# Patient Record
Sex: Male | Born: 1950 | Race: White | Hispanic: No | Marital: Married | Smoking: Former smoker
Health system: Southern US, Community
[De-identification: ages and names within clinical notes are randomized; demographics above are authoritative.]

## PROBLEM LIST (undated history)

## (undated) DIAGNOSIS — E785 Hyperlipidemia, unspecified: Secondary | ICD-10-CM

## (undated) DIAGNOSIS — I219 Acute myocardial infarction, unspecified: Secondary | ICD-10-CM

## (undated) DIAGNOSIS — M1990 Unspecified osteoarthritis, unspecified site: Secondary | ICD-10-CM

## (undated) DIAGNOSIS — I639 Cerebral infarction, unspecified: Secondary | ICD-10-CM

## (undated) DIAGNOSIS — I10 Essential (primary) hypertension: Secondary | ICD-10-CM

## (undated) DIAGNOSIS — I2510 Atherosclerotic heart disease of native coronary artery without angina pectoris: Secondary | ICD-10-CM

## (undated) HISTORY — DX: Hyperlipidemia, unspecified: E78.5

## (undated) HISTORY — DX: Acute myocardial infarction, unspecified: I21.9

## (undated) HISTORY — PX: CARDIAC CATHETERIZATION: SHX172

## (undated) HISTORY — PX: CORONARY ANGIOPLASTY: SHX604

## (undated) HISTORY — DX: Atherosclerotic heart disease of native coronary artery without angina pectoris: I25.10

## (undated) HISTORY — PX: APPENDECTOMY: SHX54

## (undated) HISTORY — DX: Essential (primary) hypertension: I10

---

## 2003-09-27 DIAGNOSIS — I219 Acute myocardial infarction, unspecified: Secondary | ICD-10-CM

## 2003-09-27 HISTORY — DX: Acute myocardial infarction, unspecified: I21.9

## 2004-01-05 ENCOUNTER — Inpatient Hospital Stay (HOSPITAL_COMMUNITY): Admission: EM | Admit: 2004-01-05 | Discharge: 2004-01-07 | Payer: Self-pay | Admitting: Emergency Medicine

## 2004-12-31 ENCOUNTER — Ambulatory Visit: Payer: Self-pay | Admitting: Cardiology

## 2005-02-02 ENCOUNTER — Ambulatory Visit: Payer: Self-pay | Admitting: Cardiology

## 2005-02-11 ENCOUNTER — Ambulatory Visit: Payer: Self-pay | Admitting: Cardiology

## 2005-02-18 ENCOUNTER — Inpatient Hospital Stay (HOSPITAL_BASED_OUTPATIENT_CLINIC_OR_DEPARTMENT_OTHER): Admission: RE | Admit: 2005-02-18 | Discharge: 2005-02-18 | Payer: Self-pay | Admitting: Cardiology

## 2005-02-24 ENCOUNTER — Ambulatory Visit: Payer: Self-pay | Admitting: Cardiology

## 2005-03-04 ENCOUNTER — Ambulatory Visit: Payer: Self-pay | Admitting: Cardiology

## 2008-02-07 ENCOUNTER — Ambulatory Visit: Payer: Self-pay | Admitting: Gastroenterology

## 2012-10-08 ENCOUNTER — Ambulatory Visit: Payer: Self-pay | Admitting: Family Medicine

## 2012-10-11 ENCOUNTER — Ambulatory Visit: Payer: Self-pay | Admitting: Family Medicine

## 2014-02-07 DIAGNOSIS — E782 Mixed hyperlipidemia: Secondary | ICD-10-CM | POA: Insufficient documentation

## 2014-02-07 DIAGNOSIS — I2510 Atherosclerotic heart disease of native coronary artery without angina pectoris: Secondary | ICD-10-CM | POA: Insufficient documentation

## 2014-08-27 DIAGNOSIS — I119 Hypertensive heart disease without heart failure: Secondary | ICD-10-CM | POA: Insufficient documentation

## 2015-02-12 DIAGNOSIS — I10 Essential (primary) hypertension: Secondary | ICD-10-CM | POA: Insufficient documentation

## 2015-08-31 ENCOUNTER — Ambulatory Visit (INDEPENDENT_AMBULATORY_CARE_PROVIDER_SITE_OTHER): Payer: BC Managed Care – PPO | Admitting: Family Medicine

## 2015-08-31 ENCOUNTER — Encounter: Payer: Self-pay | Admitting: Family Medicine

## 2015-08-31 VITALS — BP 127/74 | HR 65 | Temp 98.7°F | Ht 70.2 in | Wt 251.0 lb

## 2015-08-31 DIAGNOSIS — Z23 Encounter for immunization: Secondary | ICD-10-CM | POA: Diagnosis not present

## 2015-08-31 DIAGNOSIS — Z0000 Encounter for general adult medical examination without abnormal findings: Secondary | ICD-10-CM

## 2015-08-31 DIAGNOSIS — Z113 Encounter for screening for infections with a predominantly sexual mode of transmission: Secondary | ICD-10-CM | POA: Diagnosis not present

## 2015-08-31 DIAGNOSIS — I10 Essential (primary) hypertension: Secondary | ICD-10-CM

## 2015-08-31 DIAGNOSIS — E785 Hyperlipidemia, unspecified: Secondary | ICD-10-CM | POA: Diagnosis not present

## 2015-08-31 DIAGNOSIS — I2510 Atherosclerotic heart disease of native coronary artery without angina pectoris: Secondary | ICD-10-CM

## 2015-08-31 LAB — URINALYSIS, ROUTINE W REFLEX MICROSCOPIC
Bilirubin, UA: NEGATIVE
GLUCOSE, UA: NEGATIVE
Ketones, UA: NEGATIVE
Leukocytes, UA: NEGATIVE
NITRITE UA: NEGATIVE
PH UA: 5 (ref 5.0–7.5)
Protein, UA: NEGATIVE
Specific Gravity, UA: 1.02 (ref 1.005–1.030)
UUROB: 0.2 mg/dL (ref 0.2–1.0)

## 2015-08-31 LAB — MICROSCOPIC EXAMINATION
EPITHELIAL CELLS (NON RENAL): NONE SEEN /HPF (ref 0–10)
WBC UA: NONE SEEN /HPF (ref 0–?)

## 2015-08-31 MED ORDER — MELOXICAM 7.5 MG PO TABS: 7.5000 mg | ORAL_TABLET | Freq: Every day | ORAL | Status: DC

## 2015-08-31 MED ORDER — EZETIMIBE 10 MG PO TABS: 10.0000 mg | ORAL_TABLET | Freq: Every day | ORAL | Status: DC

## 2015-08-31 MED ORDER — CARVEDILOL 25 MG PO TABS: 25.0000 mg | ORAL_TABLET | Freq: Two times a day (BID) | ORAL | Status: DC

## 2015-08-31 MED ORDER — ATORVASTATIN CALCIUM 40 MG PO TABS: 40.0000 mg | ORAL_TABLET | Freq: Every day | ORAL | Status: DC

## 2015-08-31 MED ORDER — LISINOPRIL 40 MG PO TABS: 40.0000 mg | ORAL_TABLET | Freq: Every day | ORAL | Status: DC

## 2015-08-31 MED ORDER — AMLODIPINE BESYLATE 10 MG PO TABS: 10.0000 mg | ORAL_TABLET | Freq: Every day | ORAL | Status: DC

## 2015-08-31 NOTE — Assessment & Plan Note (Signed)
The current medical regimen is effective;  continue present plan and medications.  

## 2015-08-31 NOTE — Progress Notes (Signed)
BP 127/74 mmHg  Pulse 65  Temp(Src) 98.7 F (37.1 C)  Ht 5' 10.2" (1.783 m)  Wt 251 lb (113.853 kg)  BMI 35.81 kg/m2  SpO2 99%   Subjective:    Patient ID: Andre Gallegos, male    DOB: 27-Apr-1951, 64 y.o.   MRN: [id number]  HPI: Andre Gallegos is a 64 y.o. male  Chief Complaint  Patient presents with  . Annual Exam   doing well with blood pressure medications no complaints or issues Cholesterol medication takes well without problems No chest pain or chest symptoms Arthritis doing well takes meloxicam every day.  Relevant past medical, surgical, family and social history reviewed and updated as indicated. Interim medical history since our last visit reviewed. Allergies and medications reviewed and updated.  Review of Systems  Constitutional: Negative.   HENT: Negative.   Eyes: Negative.   Respiratory: Negative.   Cardiovascular: Negative.   Gastrointestinal: Negative.   Endocrine: Negative.   Genitourinary: Negative.   Musculoskeletal: Negative.   Skin: Negative.   Allergic/Immunologic: Negative.   Neurological: Negative.   Hematological: Negative.   Psychiatric/Behavioral: Negative.     Per HPI unless specifically indicated above     Objective:    BP 127/74 mmHg  Pulse 65  Temp(Src) 98.7 F (37.1 C)  Ht 5' 10.2" (1.783 m)  Wt 251 lb (113.853 kg)  BMI 35.81 kg/m2  SpO2 99%  Wt Readings from Last 3 Encounters:  08/31/15 251 lb (113.853 kg)  02/24/15 249 lb (112.946 kg)    Physical Exam  Constitutional: He is oriented to person, place, and time. He appears well-developed and well-nourished.  HENT:  Head: Normocephalic and atraumatic.  Right Ear: External ear normal.  Left Ear: External ear normal.  Eyes: Conjunctivae and EOM are normal. Pupils are equal, round, and reactive to light.  Neck: Normal range of motion. Neck supple.  Cardiovascular: Normal rate, regular rhythm, normal heart sounds and intact distal pulses.   Pulmonary/Chest: Effort  normal and breath sounds normal.  Abdominal: Soft. Bowel sounds are normal. There is no splenomegaly or hepatomegaly.  Genitourinary: Rectum normal, prostate normal and penis normal.  Musculoskeletal: Normal range of motion.  Neurological: He is alert and oriented to person, place, and time. He has normal reflexes.  Skin: No rash noted. No erythema.  Psychiatric: He has a normal mood and affect. His behavior is normal. Judgment and thought content normal.    No results found for this or any previous visit.    Assessment & Plan:   Problem List Items Addressed This Visit      Cardiovascular and Mediastinum   Essential hypertension    The current medical regimen is effective;  continue present plan and medications.       Relevant Medications   amLODipine (NORVASC) 10 MG tablet   atorvastatin (LIPITOR) 40 MG tablet   carvedilol (COREG) 25 MG tablet   ezetimibe (ZETIA) 10 MG tablet   lisinopril (PRINIVIL,ZESTRIL) 40 MG tablet   CAD (coronary artery disease)    The current medical regimen is effective;  continue present plan and medications.       Relevant Medications   amLODipine (NORVASC) 10 MG tablet   atorvastatin (LIPITOR) 40 MG tablet   carvedilol (COREG) 25 MG tablet   ezetimibe (ZETIA) 10 MG tablet   lisinopril (PRINIVIL,ZESTRIL) 40 MG tablet     Other   Hyperlipidemia    The current medical regimen is effective;  continue present plan and medications.  Relevant Medications   amLODipine (NORVASC) 10 MG tablet   atorvastatin (LIPITOR) 40 MG tablet   carvedilol (COREG) 25 MG tablet   ezetimibe (ZETIA) 10 MG tablet   lisinopril (PRINIVIL,ZESTRIL) 40 MG tablet    Other Visit Diagnoses    Routine general medical examination at a health care facility    -  Primary    Relevant Orders    CBC with Differential/Platelet    Comprehensive metabolic panel    Lipid Panel w/o Chol/HDL Ratio    PSA    TSH    Urinalysis, Routine w reflex microscopic (not at [facility])     Routine screening for STI (sexually transmitted infection)        Relevant Orders    Hepatitis C Antibody    Immunization due        Relevant Orders    Flu Vaccine QUAD 36+ mos PF IM (Fluarix & Fluzone Quad PF) (Completed)       Discuss wt loss , diet , exercise Follow up plan: Return in about 6 months (around 02/29/2016), or if symptoms worsen or fail to improve, for 6 mo lipids, alt, ast, BMP.

## 2015-09-01 ENCOUNTER — Encounter: Payer: Self-pay | Admitting: Family Medicine

## 2015-09-01 LAB — COMPREHENSIVE METABOLIC PANEL
A/G RATIO: 2.3 (ref 1.1–2.5)
ALK PHOS: 74 IU/L (ref 39–117)
ALT: 28 IU/L (ref 0–44)
AST: 17 IU/L (ref 0–40)
Albumin: 4.3 g/dL (ref 3.6–4.8)
BILIRUBIN TOTAL: 0.8 mg/dL (ref 0.0–1.2)
BUN/Creatinine Ratio: 10 (ref 10–22)
BUN: 12 mg/dL (ref 8–27)
CO2: 22 mmol/L (ref 18–29)
CREATININE: 1.24 mg/dL (ref 0.76–1.27)
Calcium: 9.2 mg/dL (ref 8.6–10.2)
Chloride: 104 mmol/L (ref 97–106)
GFR calc Af Amer: 71 mL/min/{1.73_m2} (ref >59)
GFR calc non Af Amer: 61 mL/min/{1.73_m2} (ref >59)
GLOBULIN, TOTAL: 1.9 g/dL (ref 1.5–4.5)
Glucose: 119 mg/dL — ABNORMAL HIGH (ref 65–99)
Potassium: 4.7 mmol/L (ref 3.5–5.2)
Sodium: 140 mmol/L (ref 136–144)
TOTAL PROTEIN: 6.2 g/dL (ref 6.0–8.5)

## 2015-09-01 LAB — CBC WITH DIFFERENTIAL/PLATELET
BASOS: 1 %
Basophils Absolute: 0.1 10*3/uL (ref 0.0–0.2)
EOS (ABSOLUTE): 0.3 10*3/uL (ref 0.0–0.4)
Eos: 4 %
Hematocrit: 43.3 % (ref 37.5–51.0)
Hemoglobin: 14.8 g/dL (ref 12.6–17.7)
IMMATURE GRANS (ABS): 0 10*3/uL (ref 0.0–0.1)
Immature Granulocytes: 1 %
Lymphocytes Absolute: 1.6 10*3/uL (ref 0.7–3.1)
Lymphs: 24 %
MCH: 31.4 pg (ref 26.6–33.0)
MCHC: 34.2 g/dL (ref 31.5–35.7)
MCV: 92 fL (ref 79–97)
MONOS ABS: 0.5 10*3/uL (ref 0.1–0.9)
Monocytes: 8 %
NEUTROS ABS: 4.1 10*3/uL (ref 1.4–7.0)
Neutrophils: 62 %
PLATELETS: 206 10*3/uL (ref 150–379)
RBC: 4.72 x10E6/uL (ref 4.14–5.80)
RDW: 13.4 % (ref 12.3–15.4)
WBC: 6.6 10*3/uL (ref 3.4–10.8)

## 2015-09-01 LAB — HEPATITIS C ANTIBODY: Hep C Virus Ab: <0.1 s/co ratio (ref 0.0–0.9)

## 2015-09-01 LAB — LIPID PANEL W/O CHOL/HDL RATIO
CHOLESTEROL TOTAL: 117 mg/dL (ref 100–199)
HDL: 30 mg/dL — AB (ref >39)
LDL CALC: 67 mg/dL (ref 0–99)
TRIGLYCERIDES: 98 mg/dL (ref 0–149)
VLDL CHOLESTEROL CAL: 20 mg/dL (ref 5–40)

## 2015-09-01 LAB — PSA: Prostate Specific Ag, Serum: 1 ng/mL (ref 0.0–4.0)

## 2015-09-01 LAB — TSH: TSH: 1.46 u[IU]/mL (ref 0.450–4.500)

## 2015-09-10 DIAGNOSIS — I214 Non-ST elevation (NSTEMI) myocardial infarction: Secondary | ICD-10-CM | POA: Insufficient documentation

## 2016-01-20 ENCOUNTER — Ambulatory Visit (INDEPENDENT_AMBULATORY_CARE_PROVIDER_SITE_OTHER): Payer: BC Managed Care – PPO | Admitting: Family Medicine

## 2016-01-20 ENCOUNTER — Encounter: Payer: Self-pay | Admitting: Family Medicine

## 2016-01-20 VITALS — BP 116/67 | HR 71 | Temp 98.5°F | Ht 70.2 in | Wt 252.0 lb

## 2016-01-20 DIAGNOSIS — J019 Acute sinusitis, unspecified: Secondary | ICD-10-CM | POA: Diagnosis not present

## 2016-01-20 MED ORDER — AMOXICILLIN-POT CLAVULANATE 875-125 MG PO TABS
1.0000 | ORAL_TABLET | Freq: Two times a day (BID) | ORAL | Status: DC
Start: 2016-01-20 — End: 2016-03-01

## 2016-01-20 MED ORDER — CODEINE POLT-CHLORPHEN POLT ER 14.7-2.8 MG/5ML PO SUER: 5.0000 mL | Freq: Two times a day (BID) | ORAL | Status: DC

## 2016-01-20 NOTE — Progress Notes (Signed)
BP 116/67 mmHg  Pulse 71  Temp(Src) 98.5 F (36.9 C)  Ht 5' 10.2" (1.783 m)  Wt 252 lb (114.306 kg)  BMI 35.96 kg/m2  SpO2 97%   Subjective:    Patient ID: Andre Gallegos, male    DOB: [birth year], 65 y.o.   MRN: [id number]  HPI: Andre Gallegos is a 65 y.o. male  Chief Complaint  Patient presents with  . URI    x1 week   Patient week of feeling bad with fever and systemic symptoms of feeling bad aching. No complaints about tick exposure Facial pressure congestion cough and a great deal. Patient not sleeping lying in a recliner has some swelling of his feet but no change from baseline and takes amlodipine 10 mg. No other CHF symptoms PND orthopnea Relevant past medical, surgical, family and social history reviewed and updated as indicated. Interim medical history since our last visit reviewed. Allergies and medications reviewed and updated.  Review of Systems  Constitutional: Positive for fever, chills and fatigue.  HENT: Positive for congestion, postnasal drip, rhinorrhea, sinus pressure, sneezing and sore throat.   Respiratory: Positive for cough. Negative for choking.   Cardiovascular: Positive for leg swelling. Negative for chest pain and palpitations.    Per HPI unless specifically indicated above     Objective:    BP 116/67 mmHg  Pulse 71  Temp(Src) 98.5 F (36.9 C)  Ht 5' 10.2" (1.783 m)  Wt 252 lb (114.306 kg)  BMI 35.96 kg/m2  SpO2 97%  Wt Readings from Last 3 Encounters:  01/20/16 252 lb (114.306 kg)  08/31/15 251 lb (113.853 kg)  02/24/15 249 lb (112.946 kg)    Physical Exam  Constitutional: He is oriented to person, place, and time. He appears well-developed and well-nourished. No distress.  HENT:  Head: Normocephalic and atraumatic.  Right Ear: Hearing and external ear normal.  Left Ear: Hearing and external ear normal.  Nose: Nose normal.  Mouth/Throat: Oropharyngeal exudate present.  Eyes: Conjunctivae and lids are normal. Right eye  exhibits no discharge. Left eye exhibits no discharge. No scleral icterus.  Cardiovascular: Normal rate, regular rhythm and normal heart sounds.   Pulmonary/Chest: Effort normal and breath sounds normal. No respiratory distress.  Musculoskeletal: Normal range of motion.  Neurological: He is alert and oriented to person, place, and time.  Skin: Skin is intact. No rash noted.  Psychiatric: He has a normal mood and affect. His speech is normal and behavior is normal. Judgment and thought content normal. Cognition and memory are normal.    Results for orders placed or performed in visit on 08/31/15  Microscopic Examination  Result Value Ref Range   WBC, UA None seen 0 -  5 /hpf   RBC, UA 0-2 0 -  2 /hpf   Epithelial Cells (non renal) None seen 0 - 10 /hpf   Bacteria, UA Few None seen/Few  CBC with Differential/Platelet  Result Value Ref Range   WBC 6.6 3.4 - 10.8 x10E3/uL   RBC 4.72 4.14 - 5.80 x10E6/uL   Hemoglobin 14.8 12.6 - 17.7 g/dL   Hematocrit 43.3 37.5 - 51.0 %   MCV 92 79 - 97 fL   MCH 31.4 26.6 - 33.0 pg   MCHC 34.2 31.5 - 35.7 g/dL   RDW 13.4 12.3 - 15.4 %   Platelets 206 150 - 379 x10E3/uL   Neutrophils 62 %   Lymphs 24 %   Monocytes 8 %   Eos 4 %  Basos 1 %   Neutrophils Absolute 4.1 1.4 - 7.0 x10E3/uL   Lymphocytes Absolute 1.6 0.7 - 3.1 x10E3/uL   Monocytes Absolute 0.5 0.1 - 0.9 x10E3/uL   EOS (ABSOLUTE) 0.3 0.0 - 0.4 x10E3/uL   Basophils Absolute 0.1 0.0 - 0.2 x10E3/uL   Immature Granulocytes 1 %   Immature Grans (Abs) 0.0 0.0 - 0.1 x10E3/uL  Comprehensive metabolic panel  Result Value Ref Range   Glucose 119 (H) 65 - 99 mg/dL   BUN 12 8 - 27 mg/dL   Creatinine, Ser 1.24 0.76 - 1.27 mg/dL   GFR calc non Af Amer 61 >59 mL/min/1.73   GFR calc Af Amer 71 >59 mL/min/1.73   BUN/Creatinine Ratio 10 10 - 22   Sodium 140 136 - 144 mmol/L   Potassium 4.7 3.5 - 5.2 mmol/L   Chloride 104 97 - 106 mmol/L   CO2 22 18 - 29 mmol/L   Calcium 9.2 8.6 - 10.2 mg/dL    Total Protein 6.2 6.0 - 8.5 g/dL   Albumin 4.3 3.6 - 4.8 g/dL   Globulin, Total 1.9 1.5 - 4.5 g/dL   Albumin/Globulin Ratio 2.3 1.1 - 2.5   Bilirubin Total 0.8 0.0 - 1.2 mg/dL   Alkaline Phosphatase 74 39 - 117 IU/L   AST 17 0 - 40 IU/L   ALT 28 0 - 44 IU/L  Lipid Panel w/o Chol/HDL Ratio  Result Value Ref Range   Cholesterol, Total 117 100 - 199 mg/dL   Triglycerides 98 0 - 149 mg/dL   HDL 30 (L) >39 mg/dL   VLDL Cholesterol Cal 20 5 - 40 mg/dL   LDL Calculated 67 0 - 99 mg/dL  PSA  Result Value Ref Range   Prostate Specific Ag, Serum 1.0 0.0 - 4.0 ng/mL  TSH  Result Value Ref Range   TSH 1.460 0.450 - 4.500 uIU/mL  Urinalysis, Routine w reflex microscopic (not at [facility])  Result Value Ref Range   Specific Gravity, UA 1.020 1.005 - 1.030   pH, UA 5.0 5.0 - 7.5   Color, UA Yellow Yellow   Appearance Ur Clear Clear   Leukocytes, UA Negative Negative   Protein, UA Negative Negative/Trace   Glucose, UA Negative Negative   Ketones, UA Negative Negative   RBC, UA Trace (A) Negative   Bilirubin, UA Negative Negative   Urobilinogen, Ur 0.2 0.2 - 1.0 mg/dL   Nitrite, UA Negative Negative   Microscopic Examination See below:   Hepatitis C Antibody  Result Value Ref Range   Hep C Virus Ab <0.1 0.0 - 0.9 s/co ratio      Assessment & Plan:   Problem List Items Addressed This Visit    None    Visit Diagnoses    Acute sinusitis, recurrence not specified, unspecified location    -  Primary    Discussed sinusitis care and treatment use of cough syrup with cautions about driving decongestant medications over-the-counter use of antibiotics and minimizin    Relevant Medications    amoxicillin-clavulanate (AUGMENTIN) 875-125 MG tablet    Codeine Polt-Chlorphen Polt ER (TUZISTRA XR) 14.7-2.8 MG/5ML SUER         Follow up plan: Return for As scheduled.

## 2016-03-01 ENCOUNTER — Ambulatory Visit (INDEPENDENT_AMBULATORY_CARE_PROVIDER_SITE_OTHER): Payer: BC Managed Care – PPO | Admitting: Family Medicine

## 2016-03-01 ENCOUNTER — Encounter: Payer: Self-pay | Admitting: Family Medicine

## 2016-03-01 VITALS — BP 138/72 | HR 66 | Temp 98.2°F | Ht 70.0 in | Wt 256.0 lb

## 2016-03-01 DIAGNOSIS — M7552 Bursitis of left shoulder: Secondary | ICD-10-CM | POA: Diagnosis not present

## 2016-03-01 DIAGNOSIS — E785 Hyperlipidemia, unspecified: Secondary | ICD-10-CM

## 2016-03-01 DIAGNOSIS — I2510 Atherosclerotic heart disease of native coronary artery without angina pectoris: Secondary | ICD-10-CM | POA: Diagnosis not present

## 2016-03-01 DIAGNOSIS — M7550 Bursitis of unspecified shoulder: Secondary | ICD-10-CM | POA: Insufficient documentation

## 2016-03-01 DIAGNOSIS — I10 Essential (primary) hypertension: Secondary | ICD-10-CM | POA: Diagnosis not present

## 2016-03-01 LAB — LP+ALT+AST PICCOLO, WAIVED
ALT (SGPT) Piccolo, Waived: 33 U/L (ref 10–47)
AST (SGOT) PICCOLO, WAIVED: 28 U/L (ref 11–38)
CHOL/HDL RATIO PICCOLO,WAIVE: 4 mg/dL
CHOLESTEROL PICCOLO, WAIVED: 110 mg/dL (ref <200)
HDL Chol Piccolo, Waived: 27 mg/dL — ABNORMAL LOW (ref >59)
LDL CHOL CALC PICCOLO WAIVED: 68 mg/dL (ref <100)
TRIGLYCERIDES PICCOLO,WAIVED: 76 mg/dL (ref <150)
VLDL CHOL CALC PICCOLO,WAIVE: 15 mg/dL (ref <30)

## 2016-03-01 NOTE — Assessment & Plan Note (Signed)
The current medical regimen is effective;  continue present plan and medications.  

## 2016-03-01 NOTE — Assessment & Plan Note (Signed)
Discussed shoulder bursitis care and treatment will continue meloxicam for now and observe

## 2016-03-01 NOTE — Progress Notes (Signed)
BP 138/72 mmHg  Pulse 66  Temp(Src) 98.2 F (36.8 C)  Ht 5\' 10"  (1.778 m)  Wt 256 lb (116.121 kg)  BMI 36.73 kg/m2  SpO2 98%   Subjective:    Patient ID: Andre Gallegos, male    DOB: 01/20/1951, 65 y.o.   MRN: [id number]  HPI: Andre Gallegos is a 65 y.o. male  Chief Complaint  Patient presents with  . Hypertension  . Hyperlipidemia  . Coronary Artery Disease  Patient follow-up hypertension doing well with medications no side effects Cholesterol doing well with no side effects Main issue today is meloxicam takes for occasional shoulder issue sometimes bothers him sometimes knots mostly when it bothers him is worse after some rest no known specific trauma irritation sometimes bothers at night. No cardiac symptoms doing well  Relevant past medical, surgical, family and social history reviewed and updated as indicated. Interim medical history since our last visit reviewed. Allergies and medications reviewed and updated.  Review of Systems  Constitutional: Negative.   Respiratory: Negative.   Cardiovascular: Negative.     Per HPI unless specifically indicated above     Objective:    BP 138/72 mmHg  Pulse 66  Temp(Src) 98.2 F (36.8 C)  Ht 5\' 10"  (1.778 m)  Wt 256 lb (116.121 kg)  BMI 36.73 kg/m2  SpO2 98%  Wt Readings from Last 3 Encounters:  03/01/16 256 lb (116.121 kg)  01/20/16 252 lb (114.306 kg)  08/31/15 251 lb (113.853 kg)    Physical Exam  Constitutional: He is oriented to person, place, and time. He appears well-developed and well-nourished. No distress.  HENT:  Head: Normocephalic and atraumatic.  Right Ear: Hearing normal.  Left Ear: Hearing normal.  Nose: Nose normal.  Eyes: Conjunctivae and lids are normal. Right eye exhibits no discharge. Left eye exhibits no discharge. No scleral icterus.  Cardiovascular: Normal rate, regular rhythm and normal heart sounds.   Pulmonary/Chest: Effort normal and breath sounds normal. No respiratory  distress.  Musculoskeletal: Normal range of motion.  Left shoulder with some limited range of motion otherwise normal exam  Neurological: He is alert and oriented to person, place, and time.  Skin: Skin is intact. No rash noted.  Psychiatric: He has a normal mood and affect. His speech is normal and behavior is normal. Judgment and thought content normal. Cognition and memory are normal.    Results for orders placed or performed in visit on 08/31/15  Microscopic Examination  Result Value Ref Range   WBC, UA None seen 0 -  5 /hpf   RBC, UA 0-2 0 -  2 /hpf   Epithelial Cells (non renal) None seen 0 - 10 /hpf   Bacteria, UA Few None seen/Few  CBC with Differential/Platelet  Result Value Ref Range   WBC 6.6 3.4 - 10.8 x10E3/uL   RBC 4.72 4.14 - 5.80 x10E6/uL   Hemoglobin 14.8 12.6 - 17.7 g/dL   Hematocrit 43.3 37.5 - 51.0 %   MCV 92 79 - 97 fL   MCH 31.4 26.6 - 33.0 pg   MCHC 34.2 31.5 - 35.7 g/dL   RDW 13.4 12.3 - 15.4 %   Platelets 206 150 - 379 x10E3/uL   Neutrophils 62 %   Lymphs 24 %   Monocytes 8 %   Eos 4 %   Basos 1 %   Neutrophils Absolute 4.1 1.4 - 7.0 x10E3/uL   Lymphocytes Absolute 1.6 0.7 - 3.1 x10E3/uL   Monocytes Absolute 0.5 0.1 -  0.9 x10E3/uL   EOS (ABSOLUTE) 0.3 0.0 - 0.4 x10E3/uL   Basophils Absolute 0.1 0.0 - 0.2 x10E3/uL   Immature Granulocytes 1 %   Immature Grans (Abs) 0.0 0.0 - 0.1 x10E3/uL  Comprehensive metabolic panel  Result Value Ref Range   Glucose 119 (H) 65 - 99 mg/dL   BUN 12 8 - 27 mg/dL   Creatinine, Ser 1.24 0.76 - 1.27 mg/dL   GFR calc non Af Amer 61 >59 mL/min/1.73   GFR calc Af Amer 71 >59 mL/min/1.73   BUN/Creatinine Ratio 10 10 - 22   Sodium 140 136 - 144 mmol/L   Potassium 4.7 3.5 - 5.2 mmol/L   Chloride 104 97 - 106 mmol/L   CO2 22 18 - 29 mmol/L   Calcium 9.2 8.6 - 10.2 mg/dL   Total Protein 6.2 6.0 - 8.5 g/dL   Albumin 4.3 3.6 - 4.8 g/dL   Globulin, Total 1.9 1.5 - 4.5 g/dL   Albumin/Globulin Ratio 2.3 1.1 - 2.5    Bilirubin Total 0.8 0.0 - 1.2 mg/dL   Alkaline Phosphatase 74 39 - 117 IU/L   AST 17 0 - 40 IU/L   ALT 28 0 - 44 IU/L  Lipid Panel w/o Chol/HDL Ratio  Result Value Ref Range   Cholesterol, Total 117 100 - 199 mg/dL   Triglycerides 98 0 - 149 mg/dL   HDL 30 (L) >39 mg/dL   VLDL Cholesterol Cal 20 5 - 40 mg/dL   LDL Calculated 67 0 - 99 mg/dL  PSA  Result Value Ref Range   Prostate Specific Ag, Serum 1.0 0.0 - 4.0 ng/mL  TSH  Result Value Ref Range   TSH 1.460 0.450 - 4.500 uIU/mL  Urinalysis, Routine w reflex microscopic (not at [facility])  Result Value Ref Range   Specific Gravity, UA 1.020 1.005 - 1.030   pH, UA 5.0 5.0 - 7.5   Color, UA Yellow Yellow   Appearance Ur Clear Clear   Leukocytes, UA Negative Negative   Protein, UA Negative Negative/Trace   Glucose, UA Negative Negative   Ketones, UA Negative Negative   RBC, UA Trace (A) Negative   Bilirubin, UA Negative Negative   Urobilinogen, Ur 0.2 0.2 - 1.0 mg/dL   Nitrite, UA Negative Negative   Microscopic Examination See below:   Hepatitis C Antibody  Result Value Ref Range   Hep C Virus Ab <0.1 0.0 - 0.9 s/co ratio      Assessment & Plan:   Problem List Items Addressed This Visit      Cardiovascular and Mediastinum   CAD (coronary artery disease)    The current medical regimen is effective;  continue present plan and medications.       Essential hypertension - Primary    The current medical regimen is effective;  continue present plan and medications.       Relevant Orders   Basic metabolic panel   LP+ALT+AST Piccolo, Waived     Musculoskeletal and Integument   Shoulder bursitis    Discussed shoulder bursitis care and treatment will continue meloxicam for now and observe        Other   Hyperlipidemia    The current medical regimen is effective;  continue present plan and medications.       Relevant Orders   Basic metabolic panel   LP+ALT+AST Piccolo, Waived       Follow up plan: Return in  about 6 months (around 08/31/2016), or if symptoms worsen or fail to  improve, for Physical Exam.

## 2016-03-02 ENCOUNTER — Encounter: Payer: Self-pay | Admitting: Family Medicine

## 2016-03-02 LAB — BASIC METABOLIC PANEL
BUN/Creatinine Ratio: 11 (ref 10–24)
BUN: 14 mg/dL (ref 8–27)
CALCIUM: 9.3 mg/dL (ref 8.6–10.2)
CO2: 21 mmol/L (ref 18–29)
Chloride: 101 mmol/L (ref 96–106)
Creatinine, Ser: 1.27 mg/dL (ref 0.76–1.27)
GFR, EST AFRICAN AMERICAN: 69 mL/min/{1.73_m2} (ref >59)
GFR, EST NON AFRICAN AMERICAN: 59 mL/min/{1.73_m2} — AB (ref >59)
Glucose: 114 mg/dL — ABNORMAL HIGH (ref 65–99)
Potassium: 4.8 mmol/L (ref 3.5–5.2)
Sodium: 139 mmol/L (ref 134–144)

## 2016-09-01 ENCOUNTER — Other Ambulatory Visit: Payer: Self-pay | Admitting: Family Medicine

## 2016-09-01 DIAGNOSIS — E785 Hyperlipidemia, unspecified: Secondary | ICD-10-CM

## 2016-09-07 ENCOUNTER — Encounter: Payer: Self-pay | Admitting: Family Medicine

## 2016-09-07 ENCOUNTER — Ambulatory Visit (INDEPENDENT_AMBULATORY_CARE_PROVIDER_SITE_OTHER): Payer: Medicare Other | Admitting: Family Medicine

## 2016-09-07 VITALS — BP 119/69 | HR 66 | Temp 98.0°F | Ht 70.5 in | Wt 250.0 lb

## 2016-09-07 DIAGNOSIS — I2510 Atherosclerotic heart disease of native coronary artery without angina pectoris: Secondary | ICD-10-CM

## 2016-09-07 DIAGNOSIS — I10 Essential (primary) hypertension: Secondary | ICD-10-CM

## 2016-09-07 DIAGNOSIS — Z1212 Encounter for screening for malignant neoplasm of rectum: Secondary | ICD-10-CM | POA: Diagnosis not present

## 2016-09-07 DIAGNOSIS — N400 Enlarged prostate without lower urinary tract symptoms: Secondary | ICD-10-CM

## 2016-09-07 DIAGNOSIS — Z1211 Encounter for screening for malignant neoplasm of colon: Secondary | ICD-10-CM | POA: Diagnosis not present

## 2016-09-07 DIAGNOSIS — E7800 Pure hypercholesterolemia, unspecified: Secondary | ICD-10-CM

## 2016-09-07 DIAGNOSIS — M7552 Bursitis of left shoulder: Secondary | ICD-10-CM

## 2016-09-07 DIAGNOSIS — Z23 Encounter for immunization: Secondary | ICD-10-CM | POA: Diagnosis not present

## 2016-09-07 DIAGNOSIS — Z0000 Encounter for general adult medical examination without abnormal findings: Secondary | ICD-10-CM

## 2016-09-07 LAB — MICROSCOPIC EXAMINATION

## 2016-09-07 LAB — URINALYSIS, ROUTINE W REFLEX MICROSCOPIC
BILIRUBIN UA: NEGATIVE
GLUCOSE, UA: NEGATIVE
KETONES UA: NEGATIVE
LEUKOCYTES UA: NEGATIVE
NITRITE UA: NEGATIVE
Protein, UA: NEGATIVE
SPEC GRAV UA: 1.02 (ref 1.005–1.030)
Urobilinogen, Ur: 0.2 mg/dL (ref 0.2–1.0)
pH, UA: 5.5 (ref 5.0–7.5)

## 2016-09-07 MED ORDER — EZETIMIBE 10 MG PO TABS: 10.0000 mg | ORAL_TABLET | Freq: Every day | ORAL | 12 refills | Status: DC

## 2016-09-07 MED ORDER — LISINOPRIL 40 MG PO TABS: 40.0000 mg | ORAL_TABLET | Freq: Every day | ORAL | 12 refills | Status: DC

## 2016-09-07 MED ORDER — CARVEDILOL 25 MG PO TABS: 25.0000 mg | ORAL_TABLET | Freq: Two times a day (BID) | ORAL | 12 refills | Status: DC

## 2016-09-07 MED ORDER — AMLODIPINE BESYLATE 10 MG PO TABS: 10.0000 mg | ORAL_TABLET | Freq: Every day | ORAL | 12 refills | Status: DC

## 2016-09-07 MED ORDER — MELOXICAM 7.5 MG PO TABS: 7.5000 mg | ORAL_TABLET | Freq: Every day | ORAL | 12 refills | Status: DC

## 2016-09-07 MED ORDER — ATORVASTATIN CALCIUM 40 MG PO TABS: 40.0000 mg | ORAL_TABLET | Freq: Every day | ORAL | 12 refills | Status: DC

## 2016-09-07 NOTE — Assessment & Plan Note (Signed)
The current medical regimen is effective;  continue present plan and medications.  

## 2016-09-07 NOTE — Progress Notes (Signed)
BP 119/69 (BP Location: Left Arm, Patient Position: Sitting, Cuff Size: Large)   Pulse 66   Temp 98 F (36.7 C)   Ht 5' 10.5" (1.791 m)   Wt 250 lb (113.4 kg)   SpO2 99%   BMI 35.36 kg/m    Subjective:    Patient ID: Andre Gallegos, male    DOB: [birth year], 65 y.o.   MRN: [id number]  HPI: Andre Gallegos is a 65 y.o. male  Chief Complaint  Patient presents with  . Annual Exam  AWV metrics met Welcome to medicare PE  Patient follow-up doing well with blood pressure no issues concerns taking medications faithfully with good control Cholesterol doing well with medications taken faithfully without problems. Shoulder bursitis arthritis does fine when the weather is warmer as it is now with cold weather is sore, gets better during the day especially with some heat. Taking aspirin 81 mg with no problems. No chest pain chest tightness.   Relevant past medical, surgical, family and social history reviewed and updated as indicated. Interim medical history since our last visit reviewed. Allergies and medications reviewed and updated.  Review of Systems  Constitutional: Negative.   HENT: Negative.   Eyes: Negative.   Respiratory: Negative.   Cardiovascular: Negative.   Gastrointestinal: Negative.   Endocrine: Negative.   Genitourinary: Negative.   Musculoskeletal: Negative.   Skin: Negative.   Allergic/Immunologic: Negative.   Neurological: Negative.   Hematological: Negative.   Psychiatric/Behavioral: Negative.     Per HPI unless specifically indicated above     Objective:    BP 119/69 (BP Location: Left Arm, Patient Position: Sitting, Cuff Size: Large)   Pulse 66   Temp 98 F (36.7 C)   Ht 5' 10.5" (1.791 m)   Wt 250 lb (113.4 kg)   SpO2 99%   BMI 35.36 kg/m   Wt Readings from Last 3 Encounters:  09/07/16 250 lb (113.4 kg)  03/01/16 256 lb (116.1 kg)  01/20/16 252 lb (114.3 kg)    Physical Exam  Constitutional: He is oriented to person, place, and  time. He appears well-developed and well-nourished.  HENT:  Head: Normocephalic and atraumatic.  Right Ear: External ear normal.  Left Ear: External ear normal.  Eyes: Conjunctivae and EOM are normal. Pupils are equal, round, and reactive to light.  Neck: Normal range of motion. Neck supple.  Cardiovascular: Normal rate, regular rhythm, normal heart sounds and intact distal pulses.   Pulmonary/Chest: Effort normal and breath sounds normal.  Abdominal: Soft. Bowel sounds are normal. There is no splenomegaly or hepatomegaly.  Genitourinary: Rectum normal and penis normal.  Genitourinary Comments: Mild BPH  Musculoskeletal: Normal range of motion.  Neurological: He is alert and oriented to person, place, and time. He has normal reflexes.  Skin: No rash noted. No erythema.  Psychiatric: He has a normal mood and affect. His behavior is normal. Judgment and thought content normal.   EKG with no acute changes  Results for orders placed or performed in visit on 93/71/69  Basic metabolic panel  Result Value Ref Range   Glucose 114 (H) 65 - 99 mg/dL   BUN 14 8 - 27 mg/dL   Creatinine, Ser 1.27 0.76 - 1.27 mg/dL   GFR calc non Af Amer 59 (L) >59 mL/min/1.73   GFR calc Af Amer 69 >59 mL/min/1.73   BUN/Creatinine Ratio 11 10 - 24   Sodium 139 134 - 144 mmol/L   Potassium 4.8 3.5 - 5.2 mmol/L  Chloride 101 96 - 106 mmol/L   CO2 21 18 - 29 mmol/L   Calcium 9.3 8.6 - 10.2 mg/dL  LP+ALT+AST Piccolo, Waived  Result Value Ref Range   ALT (SGPT) Piccolo, Waived 33 10 - 47 U/L   AST (SGOT) Piccolo, Waived 28 11 - 38 U/L   Cholesterol Piccolo, Waived 110 <200 mg/dL   HDL Chol Piccolo, Waived 27 (L) >59 mg/dL   Triglycerides Piccolo,Waived 76 <150 mg/dL   Chol/HDL Ratio Piccolo,Waive 4.0 mg/dL   LDL Chol Calc Piccolo Waived 68 <100 mg/dL   VLDL Chol Calc Piccolo,Waive 15 <30 mg/dL      Assessment & Plan:   Problem List Items Addressed This Visit      Cardiovascular and Mediastinum   CAD  (coronary artery disease)    The current medical regimen is effective;  continue present plan and medications.       Relevant Medications   ezetimibe (ZETIA) 10 MG tablet   lisinopril (PRINIVIL,ZESTRIL) 40 MG tablet   carvedilol (COREG) 25 MG tablet   atorvastatin (LIPITOR) 40 MG tablet   amLODipine (NORVASC) 10 MG tablet   Other Relevant Orders   Comprehensive metabolic panel   CBC with Differential/Platelet   TSH   Urinalysis, Routine w reflex microscopic   Essential hypertension    The current medical regimen is effective;  continue present plan and medications.       Relevant Medications   ezetimibe (ZETIA) 10 MG tablet   lisinopril (PRINIVIL,ZESTRIL) 40 MG tablet   carvedilol (COREG) 25 MG tablet   atorvastatin (LIPITOR) 40 MG tablet   amLODipine (NORVASC) 10 MG tablet   Other Relevant Orders   EKG 12-Lead (Completed)   Comprehensive metabolic panel   CBC with Differential/Platelet   TSH   Urinalysis, Routine w reflex microscopic     Musculoskeletal and Integument   Shoulder bursitis    Discussed shoulder bursitis care and treatment heat use of meloxicam may double from time to time also take breaks from time to time      Relevant Medications   meloxicam (MOBIC) 7.5 MG tablet     Genitourinary   BPH (benign prostatic hyperplasia)   Relevant Orders   PSA     Other   Hyperlipidemia    The current medical regimen is effective;  continue present plan and medications.       Relevant Medications   ezetimibe (ZETIA) 10 MG tablet   lisinopril (PRINIVIL,ZESTRIL) 40 MG tablet   carvedilol (COREG) 25 MG tablet   atorvastatin (LIPITOR) 40 MG tablet   amLODipine (NORVASC) 10 MG tablet   Other Relevant Orders   Lipid panel   TSH    Other Visit Diagnoses    Need for influenza vaccination    -  Primary   Relevant Orders   Flu vaccine HIGH DOSE PF (Fluzone High dose) (Completed)   Screening for colorectal cancer       Relevant Orders   Ambulatory referral to  General Surgery   PE (physical exam), annual           Follow up plan: Return in about 6 months (around 03/08/2017) for BMP,  Lipids, ALT, AST.

## 2016-09-07 NOTE — Assessment & Plan Note (Signed)
Discussed shoulder bursitis care and treatment heat use of meloxicam may double from time to time also take breaks from time to time

## 2016-09-08 ENCOUNTER — Encounter: Payer: Self-pay | Admitting: Family Medicine

## 2016-09-08 LAB — CBC WITH DIFFERENTIAL/PLATELET
BASOS ABS: 0 10*3/uL (ref 0.0–0.2)
BASOS: 0 %
EOS (ABSOLUTE): 0.2 10*3/uL (ref 0.0–0.4)
Eos: 3 %
HEMOGLOBIN: 14.7 g/dL (ref 13.0–17.7)
Hematocrit: 43.4 % (ref 37.5–51.0)
IMMATURE GRANS (ABS): 0 10*3/uL (ref 0.0–0.1)
IMMATURE GRANULOCYTES: 0 %
LYMPHS: 21 %
Lymphocytes Absolute: 1.4 10*3/uL (ref 0.7–3.1)
MCH: 31.1 pg (ref 26.6–33.0)
MCHC: 33.9 g/dL (ref 31.5–35.7)
MCV: 92 fL (ref 79–97)
MONOCYTES: 9 %
Monocytes Absolute: 0.6 10*3/uL (ref 0.1–0.9)
NEUTROS ABS: 4.5 10*3/uL (ref 1.4–7.0)
NEUTROS PCT: 67 %
PLATELETS: 199 10*3/uL (ref 150–379)
RBC: 4.72 x10E6/uL (ref 4.14–5.80)
RDW: 13.2 % (ref 12.3–15.4)
WBC: 6.7 10*3/uL (ref 3.4–10.8)

## 2016-09-08 LAB — COMPREHENSIVE METABOLIC PANEL
A/G RATIO: 2.1 (ref 1.2–2.2)
ALBUMIN: 4.5 g/dL (ref 3.6–4.8)
ALK PHOS: 69 IU/L (ref 39–117)
ALT: 35 IU/L (ref 0–44)
AST: 21 IU/L (ref 0–40)
BILIRUBIN TOTAL: 0.7 mg/dL (ref 0.0–1.2)
BUN / CREAT RATIO: 9 — AB (ref 10–24)
BUN: 12 mg/dL (ref 8–27)
CO2: 25 mmol/L (ref 18–29)
CREATININE: 1.31 mg/dL — AB (ref 0.76–1.27)
Calcium: 9.5 mg/dL (ref 8.6–10.2)
Chloride: 104 mmol/L (ref 96–106)
GFR calc Af Amer: 66 mL/min/{1.73_m2} (ref >59)
GFR calc non Af Amer: 57 mL/min/{1.73_m2} — ABNORMAL LOW (ref >59)
GLOBULIN, TOTAL: 2.1 g/dL (ref 1.5–4.5)
Glucose: 114 mg/dL — ABNORMAL HIGH (ref 65–99)
POTASSIUM: 5.1 mmol/L (ref 3.5–5.2)
SODIUM: 143 mmol/L (ref 134–144)
Total Protein: 6.6 g/dL (ref 6.0–8.5)

## 2016-09-08 LAB — TSH: TSH: 2.11 u[IU]/mL (ref 0.450–4.500)

## 2016-09-08 LAB — LIPID PANEL
CHOLESTEROL TOTAL: 90 mg/dL — AB (ref 100–199)
Chol/HDL Ratio: 3.2 ratio units (ref 0.0–5.0)
HDL: 28 mg/dL — ABNORMAL LOW (ref >39)
LDL CALC: 44 mg/dL (ref 0–99)
TRIGLYCERIDES: 90 mg/dL (ref 0–149)
VLDL Cholesterol Cal: 18 mg/dL (ref 5–40)

## 2016-09-08 LAB — PSA: Prostate Specific Ag, Serum: 1.1 ng/mL (ref 0.0–4.0)

## 2016-10-07 ENCOUNTER — Other Ambulatory Visit: Payer: Self-pay

## 2016-10-18 ENCOUNTER — Other Ambulatory Visit: Payer: Self-pay

## 2016-10-18 ENCOUNTER — Telehealth: Payer: Self-pay

## 2016-10-18 NOTE — Telephone Encounter (Signed)
Screening colonoscopy with Vicente Males at [facility] on 10/25/16.

## 2016-10-18 NOTE — Telephone Encounter (Signed)
Gastroenterology Pre-Procedure Review  Request Date:  Requesting Physician: Dr.   PATIENT REVIEW QUESTIONS: The patient responded to the following health history questions as indicated:    1. Are you having any GI issues? no 2. Do you have a personal history of Polyps?  Yes, 20 years ago. 3. Do you have a family history of Colon Cancer or Polyps? yes 4. Diabetes Mellitus? no 5. Joint replacements in the past 12 months?no 6. Major health problems in the past 3 months?no 7. Any artificial heart valves, MVP, or defibrillator?no    MEDICATIONS & ALLERGIES:    Patient reports the following regarding taking any anticoagulation/antiplatelet therapy:   Plavix, Coumadin, Eliquis, Xarelto, Lovenox, Pradaxa, Brilinta, or Effient? no Aspirin? yes (ASA 81mg )  Patient confirms/reports the following medications:  Current Outpatient Prescriptions  Medication Sig Dispense Refill  . amLODipine (NORVASC) 10 MG tablet Take 1 tablet (10 mg total) by mouth daily. 30 tablet 12  . aspirin (ASPIRIN EC) 81 MG EC tablet Take 81 mg by mouth daily. Swallow whole.    Marland Kitchen atorvastatin (LIPITOR) 40 MG tablet Take 1 tablet (40 mg total) by mouth daily at 6 PM. 30 tablet 12  . carvedilol (COREG) 25 MG tablet Take 1 tablet (25 mg total) by mouth 2 (two) times daily with a meal. 60 tablet 12  . ezetimibe (ZETIA) 10 MG tablet Take 1 tablet (10 mg total) by mouth daily. 30 tablet 12  . lisinopril (PRINIVIL,ZESTRIL) 40 MG tablet Take 1 tablet (40 mg total) by mouth daily. 30 tablet 12  . meloxicam (MOBIC) 7.5 MG tablet Take 1 tablet (7.5 mg total) by mouth daily. 30 tablet 12   No current facility-administered medications for this visit.     Patient confirms/reports the following allergies:  No Known Allergies  No orders of the defined types were placed in this encounter.   AUTHORIZATION INFORMATION Primary Insurance: 1D#: Group #:  Secondary Insurance: 1D#: Group #:  SCHEDULE INFORMATION: Date:  10/25/16 Time: Location: [place]

## 2016-10-31 ENCOUNTER — Encounter: Payer: Self-pay | Admitting: *Deleted

## 2016-11-01 ENCOUNTER — Encounter: Payer: Self-pay | Admitting: *Deleted

## 2016-11-01 ENCOUNTER — Encounter
Admission: RE | Disposition: A | Discharge status: Home / Self Care | Payer: Self-pay | Source: Ambulatory Visit | Attending: Gastroenterology

## 2016-11-01 ENCOUNTER — Ambulatory Visit: Payer: Medicare Other | Admitting: Anesthesiology

## 2016-11-01 ENCOUNTER — Ambulatory Visit
Admission: RE | Admit: 2016-11-01 | Discharge: 2016-11-01 | Disposition: A | Discharge status: Home / Self Care | Payer: Medicare Other | Source: Ambulatory Visit | Attending: Gastroenterology | Admitting: Gastroenterology

## 2016-11-01 DIAGNOSIS — E669 Obesity, unspecified: Secondary | ICD-10-CM | POA: Diagnosis not present

## 2016-11-01 DIAGNOSIS — Z86010 Personal history of colonic polyps: Secondary | ICD-10-CM | POA: Diagnosis not present

## 2016-11-01 DIAGNOSIS — D120 Benign neoplasm of cecum: Secondary | ICD-10-CM | POA: Diagnosis not present

## 2016-11-01 DIAGNOSIS — Z79899 Other long term (current) drug therapy: Secondary | ICD-10-CM | POA: Insufficient documentation

## 2016-11-01 DIAGNOSIS — K649 Unspecified hemorrhoids: Secondary | ICD-10-CM | POA: Diagnosis not present

## 2016-11-01 DIAGNOSIS — E785 Hyperlipidemia, unspecified: Secondary | ICD-10-CM | POA: Insufficient documentation

## 2016-11-01 DIAGNOSIS — I2510 Atherosclerotic heart disease of native coronary artery without angina pectoris: Secondary | ICD-10-CM | POA: Diagnosis not present

## 2016-11-01 DIAGNOSIS — K640 First degree hemorrhoids: Secondary | ICD-10-CM | POA: Insufficient documentation

## 2016-11-01 DIAGNOSIS — Z87891 Personal history of nicotine dependence: Secondary | ICD-10-CM | POA: Insufficient documentation

## 2016-11-01 DIAGNOSIS — K5730 Diverticulosis of large intestine without perforation or abscess without bleeding: Secondary | ICD-10-CM

## 2016-11-01 DIAGNOSIS — Z7982 Long term (current) use of aspirin: Secondary | ICD-10-CM | POA: Diagnosis not present

## 2016-11-01 DIAGNOSIS — Z1211 Encounter for screening for malignant neoplasm of colon: Secondary | ICD-10-CM | POA: Diagnosis not present

## 2016-11-01 DIAGNOSIS — I252 Old myocardial infarction: Secondary | ICD-10-CM | POA: Insufficient documentation

## 2016-11-01 DIAGNOSIS — I10 Essential (primary) hypertension: Secondary | ICD-10-CM | POA: Insufficient documentation

## 2016-11-01 DIAGNOSIS — K5790 Diverticulosis of intestine, part unspecified, without perforation or abscess without bleeding: Secondary | ICD-10-CM | POA: Diagnosis not present

## 2016-11-01 DIAGNOSIS — M1990 Unspecified osteoarthritis, unspecified site: Secondary | ICD-10-CM | POA: Diagnosis not present

## 2016-11-01 DIAGNOSIS — K635 Polyp of colon: Secondary | ICD-10-CM | POA: Diagnosis not present

## 2016-11-01 HISTORY — PX: COLONOSCOPY WITH PROPOFOL: SHX5780

## 2016-11-01 HISTORY — DX: Unspecified osteoarthritis, unspecified site: M19.90

## 2016-11-01 SURGERY — COLONOSCOPY WITH PROPOFOL
Anesthesia: General

## 2016-11-01 MED ORDER — SODIUM CHLORIDE 0.9 % IV SOLN
INTRAVENOUS | Status: DC
  Administered 2016-11-01: 09:00:00 via INTRAVENOUS

## 2016-11-01 MED ORDER — LIDOCAINE HCL (CARDIAC) 20 MG/ML IV SOLN
INTRAVENOUS | Status: DC | PRN
  Administered 2016-11-01: 80 mg via INTRAVENOUS

## 2016-11-01 MED ORDER — PROPOFOL 500 MG/50ML IV EMUL
INTRAVENOUS | Status: AC
  Filled 2016-11-01: qty 50

## 2016-11-01 MED ORDER — PHENYLEPHRINE HCL 10 MG/ML IJ SOLN
INTRAMUSCULAR | Status: DC | PRN
  Administered 2016-11-01 (×4): 200 ug via INTRAVENOUS

## 2016-11-01 MED ORDER — LIDOCAINE HCL (PF) 2 % IJ SOLN
INTRAMUSCULAR | Status: AC
Start: 2016-11-01 — End: 2016-11-01
  Filled 2016-11-01: qty 2

## 2016-11-01 MED ORDER — PROPOFOL 500 MG/50ML IV EMUL
INTRAVENOUS | Status: DC | PRN
  Administered 2016-11-01: 125 ug/kg/min via INTRAVENOUS

## 2016-11-01 MED ORDER — PROPOFOL 10 MG/ML IV BOLUS
INTRAVENOUS | Status: DC | PRN
  Administered 2016-11-01: 70 mg via INTRAVENOUS

## 2016-11-01 NOTE — Op Note (Signed)
[facility] Gastroenterology Patient Name: Andre Gallegos Procedure Date: 11/01/2016 8:49 AM MRN: [id number] Account #: [id number] Date of Birth: 1951/03/03 Admit Type: Outpatient Age: 66 Room: [facility] ENDO ROOM 4 Gender: Male Note Status: Finalized Procedure:            Colonoscopy Indications:          High risk colon cancer surveillance: Personal history                        of colonic polyps, Last colonoscopy: May 2009 Providers:            Jonathon Bellows MD, MD Referring MD:         Guadalupe Maple, MD (Referring MD) Medicines:            Monitored Anesthesia Care Complications:        No immediate complications. Procedure:            Pre-Anesthesia Assessment:                       - Prior to the procedure, a History and Physical was                        performed, and patient medications, allergies and                        sensitivities were reviewed. The patient's tolerance of                        previous anesthesia was reviewed.                       - The risks and benefits of the procedure and the                        sedation options and risks were discussed with the                        patient. All questions were answered and informed                        consent was obtained.                       - The risks and benefits of the procedure and the                        sedation options and risks were discussed with the                        patient. All questions were answered and informed                        consent was obtained.                       - ASA Grade Assessment: III - A patient with severe                        systemic disease.  After obtaining informed consent, the colonoscope was                        passed under direct vision. Throughout the procedure,                        the patient's blood pressure, pulse, and oxygen                        saturations were monitored continuously. The                     Colonoscope was introduced through the anus and                        advanced to the the cecum, identified by the                        appendiceal orifice, IC valve and transillumination.                        The colonoscopy was performed with ease. The patient                        tolerated the procedure well. The quality of the bowel                        preparation was good. Findings:      The perianal and digital rectal examinations were normal.      A 5 mm polyp was found in the cecum. The polyp was sessile. The polyp       was removed with a cold biopsy forceps. Resection and retrieval were       complete.      Non-bleeding internal hemorrhoids were found during retroflexion. The       hemorrhoids were small and Grade I (internal hemorrhoids that do not       prolapse).      Multiple medium-mouthed diverticula were found in the sigmoid colon.      The exam was otherwise without abnormality on direct and retroflexion       views. Impression:           - One 5 mm polyp in the cecum, removed with a cold                        biopsy forceps. Resected and retrieved.                       - Non-bleeding internal hemorrhoids.                       - Diverticulosis in the sigmoid colon.                       - The examination was otherwise normal on direct and                        retroflexion views. Recommendation:       - Discharge patient to home (with escort).                       -  Resume previous diet.                       - Continue present medications.                       - Await pathology results.                       - Repeat colonoscopy in 5 years for surveillance. Procedure Code(s):    --- Professional ---                       [phone], Colonoscopy, flexible; with biopsy, single or                        multiple Diagnosis Code(s):    --- Professional ---                       Z86.010, Personal history of colonic polyps                        D12.0, Benign neoplasm of cecum                       K64.0, First degree hemorrhoids                       K57.30, Diverticulosis of large intestine without                        perforation or abscess without bleeding CPT copyright 2016 American Medical Association. All rights reserved. The codes documented in this report are preliminary and upon coder review may  be revised to meet current compliance requirements. Jonathon Bellows, MD Jonathon Bellows MD, MD 11/01/2016 9:35:37 AM This report has been signed electronically. Number of Addenda: 0 Note Initiated On: 11/01/2016 8:49 AM Scope Withdrawal Time: 0 hours 17 minutes 31 seconds  Total Procedure Duration: 0 hours 20 minutes 13 seconds       [facility]

## 2016-11-01 NOTE — H&P (Signed)
Jonathon Bellows MD [address] [place], [place] [address] Phone: [phone] Fax : [phone]  Primary Care Physician:  Golden Pop, MD Primary Gastroenterologist:  Dr. Jonathon Bellows   Pre-Procedure History & Physical: HPI:  Andre Gallegos is a 66 y.o. male is here for an colonoscopy.   Past Medical History:  Diagnosis Date  . Arthritis   . CAD (coronary artery disease)   . Hyperlipidemia   . Myocardial infarction 2005    Past Surgical History:  Procedure Laterality Date  . APPENDECTOMY    . CARDIAC CATHETERIZATION    . CORONARY ANGIOPLASTY      Prior to Admission medications   Medication Sig Start Date End Date Taking? Authorizing Provider  amLODipine (NORVASC) 10 MG tablet Take 1 tablet (10 mg total) by mouth daily. 09/07/16  Yes Guadalupe Maple, MD  aspirin (ASPIRIN EC) 81 MG EC tablet Take 81 mg by mouth daily. Swallow whole.   Yes Historical Provider, MD  atorvastatin (LIPITOR) 40 MG tablet Take 1 tablet (40 mg total) by mouth daily at 6 PM. 09/07/16  Yes Guadalupe Maple, MD  carvedilol (COREG) 25 MG tablet Take 1 tablet (25 mg total) by mouth 2 (two) times daily with a meal. 09/07/16  Yes Guadalupe Maple, MD  ezetimibe (ZETIA) 10 MG tablet Take 1 tablet (10 mg total) by mouth daily. 09/07/16  Yes Guadalupe Maple, MD  Lactobacillus (PROBIOTIC ACIDOPHILUS PO) Take 1 capsule by mouth.   Yes Historical Provider, MD  lisinopril (PRINIVIL,ZESTRIL) 40 MG tablet Take 1 tablet (40 mg total) by mouth daily. 09/07/16  Yes Guadalupe Maple, MD  meloxicam (MOBIC) 7.5 MG tablet Take 1 tablet (7.5 mg total) by mouth daily. 09/07/16  Yes Guadalupe Maple, MD    Allergies as of 10/18/2016  . (No Known Allergies)    Family History  Problem Relation Age of Onset  . Cancer Father     Lung    Social History   Social History  . Marital status: Married    Spouse name: N/A  . Number of children: N/A  . Years of education: N/A   Occupational History  . Not on file.    Social History Main Topics  . Smoking status: Former Smoker    Types: Cigarettes    Quit date: 08/30/1985  . Smokeless tobacco: Current User    Types: Snuff  . Alcohol use Yes     Comment: rare  . Drug use: No  . Sexual activity: Not on file   Other Topics Concern  . Not on file   Social History Narrative  . No narrative on file    Review of Systems: See HPI, otherwise negative ROS  Physical Exam: BP (!) 146/62   Pulse 62 Comment: 62  Temp 98.4 F (36.9 C) (Tympanic)   Resp 20   Ht 5\' 10"  (1.778 m)   Wt 250 lb (113.4 kg)   SpO2 99%   BMI 35.87 kg/m  General:   Alert,  pleasant and cooperative in NAD Head:  Normocephalic and atraumatic. Neck:  Supple; no masses or thyromegaly. Lungs:  Clear throughout to auscultation.    Heart:  Regular rate and rhythm. Abdomen:  Soft, nontender and nondistended. Normal bowel sounds, without guarding, and without rebound.   Neurologic:  Alert and  oriented x4;  grossly normal neurologically.  Impression/Plan: Andre Gallegos is here for an colonoscopy to be performed for surveillance due to personal history of colon polyps  Risks, benefits, limitations, and alternatives regarding  colonoscopy have been reviewed with the patient.  Questions have been answered.  All parties agreeable.   Jonathon Bellows, MD  11/01/2016, 8:49 AM

## 2016-11-01 NOTE — Anesthesia Post-op Follow-up Note (Cosign Needed)
Anesthesia QCDR form completed.        

## 2016-11-01 NOTE — Anesthesia Procedure Notes (Signed)
Performed by: Khaiden Segreto Pre-anesthesia Checklist: Patient identified, Emergency Drugs available, Suction available, Patient being monitored and Timeout performed Patient Re-evaluated:Patient Re-evaluated prior to inductionOxygen Delivery Method: Nasal cannula Preoxygenation: Pre-oxygenation with 100% oxygen Intubation Type: IV induction       

## 2016-11-01 NOTE — Transfer of Care (Signed)
Immediate Anesthesia Transfer of Care Note  Patient: Andre Gallegos  Procedure(s) Performed: Procedure(s): COLONOSCOPY WITH PROPOFOL (N/A)  Patient Location: PACU  Anesthesia Type:General  Level of Consciousness: sedated and responds to stimulation  Airway & Oxygen Therapy: Patient Spontanous Breathing and Patient connected to nasal cannula oxygen  Post-op Assessment: Report given to RN and Post -op Vital signs reviewed and stable  Post vital signs: Reviewed and stable  Last Vitals:  Vitals:   11/01/16 0937 11/01/16 0938  BP:  (!) 113/48  Pulse:  66  Resp:  13  Temp: (!) (P) 36 C     Last Pain:  Vitals:   11/01/16 0937  TempSrc: (P) Tympanic         Complications: No apparent anesthesia complications

## 2016-11-01 NOTE — Anesthesia Preprocedure Evaluation (Signed)
Anesthesia Evaluation  Patient identified by MRN, date of birth, ID band Patient awake    Reviewed: Allergy & Precautions, NPO status , Patient's Chart, lab work & pertinent test results  History of Anesthesia Complications Negative for: history of anesthetic complications  Airway Mallampati: II  TM Distance: >3 FB Neck ROM: Full    Dental no notable dental hx.    Pulmonary neg sleep apnea, neg COPD, former smoker,    breath sounds clear to auscultation- rhonchi (-) wheezing      Cardiovascular Exercise Tolerance: Good hypertension, Pt. on medications (-) angina+ CAD, + Past MI and + Cardiac Stents (2005)   Rhythm:Regular Rate:Normal - Systolic murmurs and - Diastolic murmurs    Neuro/Psych negative neurological ROS  negative psych ROS   GI/Hepatic negative GI ROS, Neg liver ROS,   Endo/Other  negative endocrine ROSneg diabetes  Renal/GU negative Renal ROS     Musculoskeletal  (+) Arthritis ,   Abdominal (+) + obese,   Peds  Hematology negative hematology ROS (+)   Anesthesia Other Findings Past Medical History: No date: Arthritis No date: CAD (coronary artery disease) No date: Hyperlipidemia 2005: Myocardial infarction   Reproductive/Obstetrics                             Anesthesia Physical Anesthesia Plan  ASA: III  Anesthesia Plan: General   Post-op Pain Management:    Induction: Intravenous  Airway Management Planned: Natural Airway  Additional Equipment:   Intra-op Plan:   Post-operative Plan:   Informed Consent: I have reviewed the patients History and Physical, chart, labs and discussed the procedure including the risks, benefits and alternatives for the proposed anesthesia with the patient or authorized representative who has indicated his/her understanding and acceptance.   Dental advisory given  Plan Discussed with: CRNA and Anesthesiologist  Anesthesia  Plan Comments:         Anesthesia Quick Evaluation

## 2016-11-01 NOTE — Anesthesia Postprocedure Evaluation (Signed)
Anesthesia Post Note  Patient: Andre Gallegos  Procedure(s) Performed: Procedure(s) (LRB): COLONOSCOPY WITH PROPOFOL (N/A)  Patient location during evaluation: Endoscopy Anesthesia Type: General Level of consciousness: awake and alert and oriented Pain management: pain level controlled Vital Signs Assessment: post-procedure vital signs reviewed and stable Respiratory status: spontaneous breathing, nonlabored ventilation and respiratory function stable Cardiovascular status: blood pressure returned to baseline and stable Postop Assessment: no signs of nausea or vomiting Anesthetic complications: no     Last Vitals:  Vitals:   11/01/16 0947 11/01/16 0957  BP: 98/82 116/66  Pulse: 65 (!) 57  Resp: 11 14  Temp:      Last Pain:  Vitals:   11/01/16 0938  TempSrc:   PainSc: Asleep                 [name]

## 2016-11-02 ENCOUNTER — Encounter: Payer: Self-pay | Admitting: Gastroenterology

## 2016-11-02 LAB — SURGICAL PATHOLOGY

## 2016-11-03 ENCOUNTER — Encounter: Payer: Self-pay | Admitting: Gastroenterology

## 2016-11-03 NOTE — Progress Notes (Signed)
5 year recall colonoscopy

## 2016-11-21 ENCOUNTER — Encounter: Payer: Self-pay | Admitting: Family Medicine

## 2016-11-21 ENCOUNTER — Ambulatory Visit (INDEPENDENT_AMBULATORY_CARE_PROVIDER_SITE_OTHER): Payer: Medicare Other | Admitting: Family Medicine

## 2016-11-21 DIAGNOSIS — I10 Essential (primary) hypertension: Secondary | ICD-10-CM | POA: Diagnosis not present

## 2016-11-21 DIAGNOSIS — L247 Irritant contact dermatitis due to plants, except food: Secondary | ICD-10-CM | POA: Diagnosis not present

## 2016-11-21 DIAGNOSIS — L259 Unspecified contact dermatitis, unspecified cause: Secondary | ICD-10-CM | POA: Insufficient documentation

## 2016-11-21 MED ORDER — PREDNISONE 10 MG PO TABS
ORAL_TABLET | ORAL | 0 refills | Status: DC
Start: 2016-11-21 — End: 2017-04-04

## 2016-11-21 NOTE — Assessment & Plan Note (Signed)
Discuss hypertension may be transiently elevated with prednisone patient will double down on good diet during this time.

## 2016-11-21 NOTE — Progress Notes (Signed)
BP 133/79   Pulse 64   Ht 5' 11.58" (1.818 m)   Wt 248 lb (112.5 kg)   SpO2 98%   BMI 34.03 kg/m    Subjective:    Patient ID: Andre Gallegos, male    DOB: 03-Aug-1951, 66 y.o.   MRN: [id number]  HPI: Andre Gallegos is a 66 y.o. male  Chief Complaint  Patient presents with  . Poison Ivy    All over. Noticed on thursday  Patient with poison ivy's been out in the woods cutting trees. Is aware of some trees with Lynann Bologna vines. Has poison ivy essentially all over his body worsens on his hands.  Blood pressures been doing well taking medications without problems.  Relevant past medical, surgical, family and social history reviewed and updated as indicated. Interim medical history since our last visit reviewed. Allergies and medications reviewed and updated.  Review of Systems  Constitutional: Negative.   Respiratory: Negative.   Cardiovascular: Negative.     Per HPI unless specifically indicated above     Objective:    BP 133/79   Pulse 64   Ht 5' 11.58" (1.818 m)   Wt 248 lb (112.5 kg)   SpO2 98%   BMI 34.03 kg/m   Wt Readings from Last 3 Encounters:  11/21/16 248 lb (112.5 kg)  11/01/16 250 lb (113.4 kg)  09/07/16 250 lb (113.4 kg)    Physical Exam  Constitutional: He is oriented to person, place, and time. He appears well-developed and well-nourished.  HENT:  Head: Normocephalic and atraumatic.  Eyes: Conjunctivae and EOM are normal.  Neck: Normal range of motion.  Cardiovascular: Normal rate, regular rhythm and normal heart sounds.   Pulmonary/Chest: Effort normal and breath sounds normal.  Musculoskeletal: Normal range of motion.  Neurological: He is alert and oriented to person, place, and time.  Skin: No erythema.  Multiple areas of poison ivy hands or arms chest GU area  Psychiatric: He has a normal mood and affect. His behavior is normal. Judgment and thought content normal.    Results for orders placed or performed during the hospital  encounter of 11/01/16  Surgical pathology  Result Value Ref Range   SURGICAL PATHOLOGY      Surgical Pathology CASE: [id number] PATIENT: Laurey Morale Surgical Pathology Report     SPECIMEN SUBMITTED: A. Colon polyp, cecum; cbx  CLINICAL HISTORY: None provided  PRE-OPERATIVE DIAGNOSIS: Screening Z12.11  POST-OPERATIVE DIAGNOSIS: Colon polyp, diverticulosis, hemorrhoids     DIAGNOSIS: A. COLON POLYP, CECUM; COLD BIOPSY: - PROMINENT LYMPHOID AGGREGATE.   GROSS DESCRIPTION:  A. Labeled: cecum polyp C BX  Tissue fragment(s): 1  Size: 0.5 cm  Description: tan fragment  Entirely submitted in 1 cassette(s).    Final Diagnosis performed by Delorse Lek, MD.  Electronically signed 11/02/2016 11:44:29AM    The electronic signature indicates that the named Attending Pathologist has evaluated the specimen  Technical component performed at [facility], [address] [place], [place] [address] Lab: [phone] Dir: Darrick Penna. Evette Doffing, MD  Professional component performed at [facility], [facility], [place], [place], [place] [address] Lab: [phone] Dir: Dellia Nims. Rubinas, MD        Assessment & Plan:   Problem List Items Addressed This Visit      Cardiovascular and Mediastinum   Essential hypertension    Discuss hypertension may be transiently elevated with prednisone patient will double down on good diet during this time.  Musculoskeletal and Integument   Contact dermatitis    Discuss poison ivy care and treatment prevention. Discuss prednisone and hypertension.          Follow up plan: Return for As scheduled.

## 2016-11-21 NOTE — Assessment & Plan Note (Signed)
Discuss poison ivy care and treatment prevention. Discuss prednisone and hypertension.

## 2017-03-02 ENCOUNTER — Encounter: Payer: Self-pay | Admitting: Family Medicine

## 2017-03-14 ENCOUNTER — Ambulatory Visit: Payer: BC Managed Care – PPO | Admitting: Family Medicine

## 2017-04-04 ENCOUNTER — Ambulatory Visit (INDEPENDENT_AMBULATORY_CARE_PROVIDER_SITE_OTHER): Payer: Medicare Other | Admitting: Family Medicine

## 2017-04-04 ENCOUNTER — Encounter: Payer: Self-pay | Admitting: Family Medicine

## 2017-04-04 VITALS — BP 136/70 | HR 66 | Wt 247.0 lb

## 2017-04-04 DIAGNOSIS — I2510 Atherosclerotic heart disease of native coronary artery without angina pectoris: Secondary | ICD-10-CM

## 2017-04-04 DIAGNOSIS — I10 Essential (primary) hypertension: Secondary | ICD-10-CM | POA: Diagnosis not present

## 2017-04-04 DIAGNOSIS — E7800 Pure hypercholesterolemia, unspecified: Secondary | ICD-10-CM

## 2017-04-04 DIAGNOSIS — Z23 Encounter for immunization: Secondary | ICD-10-CM

## 2017-04-04 LAB — LP+ALT+AST PICCOLO, WAIVED
ALT (SGPT) PICCOLO, WAIVED: 32 U/L (ref 10–47)
AST (SGOT) Piccolo, Waived: 25 U/L (ref 11–38)
CHOL/HDL RATIO PICCOLO,WAIVE: 3.7 mg/dL
Cholesterol Piccolo, Waived: 109 mg/dL (ref <200)
HDL CHOL PICCOLO, WAIVED: 29 mg/dL — AB (ref >59)
LDL Chol Calc Piccolo Waived: 65 mg/dL (ref <100)
TRIGLYCERIDES PICCOLO,WAIVED: 75 mg/dL (ref <150)
VLDL CHOL CALC PICCOLO,WAIVE: 15 mg/dL (ref <30)

## 2017-04-04 NOTE — Assessment & Plan Note (Signed)
The current medical regimen is effective;  continue present plan and medications.  

## 2017-04-04 NOTE — Progress Notes (Signed)
   BP 136/70 (BP Location: Left Arm)   Pulse 66   Wt 247 lb (112 kg)   SpO2 97%   BMI 33.89 kg/m    Subjective:    Patient ID: Andre Gallegos, male    DOB: 03-Dec-1950, 66 y.o.   MRN: [id number]  HPI: Andre Gallegos is a 66 y.o. male  Chief Complaint  Patient presents with  . Follow-up  . Hypertension  . Hyperlipidemia  Patient follow-up hypertension doing well no complaints from medications taking medications faithfully without side effects. Same for cholesterol doing well without complaints or issues. No cardiac symptoms no PND orthopnea distal edema or other changes.   Relevant past medical, surgical, family and social history reviewed and updated as indicated. Interim medical history since our last visit reviewed. Allergies and medications reviewed and updated.  Review of Systems  Constitutional: Negative.   Respiratory: Negative.   Cardiovascular: Negative.     Per HPI unless specifically indicated above     Objective:    BP 136/70 (BP Location: Left Arm)   Pulse 66   Wt 247 lb (112 kg)   SpO2 97%   BMI 33.89 kg/m   Wt Readings from Last 3 Encounters:  04/04/17 247 lb (112 kg)  11/21/16 248 lb (112.5 kg)  11/01/16 250 lb (113.4 kg)    Physical Exam  Constitutional: He is oriented to person, place, and time. He appears well-developed and well-nourished.  HENT:  Head: Normocephalic and atraumatic.  Eyes: Conjunctivae and EOM are normal.  Neck: Normal range of motion.  Cardiovascular: Normal rate, regular rhythm and normal heart sounds.   Pulmonary/Chest: Effort normal and breath sounds normal.  Musculoskeletal: Normal range of motion.  Neurological: He is alert and oriented to person, place, and time.  Skin: No erythema.  Psychiatric: He has a normal mood and affect. His behavior is normal. Judgment and thought content normal.        Assessment & Plan:   Problem List Items Addressed This Visit      Cardiovascular and Mediastinum   CAD  (coronary artery disease)    The current medical regimen is effective;  continue present plan and medications.       Essential hypertension    The current medical regimen is effective;  continue present plan and medications.       Relevant Orders   Basic metabolic panel     Other   Hyperlipidemia    The current medical regimen is effective;  continue present plan and medications.       Relevant Orders   LP+ALT+AST [place], [place]    Other Visit Diagnoses    Need for pneumococcal vaccination    -  Primary   Relevant Orders   Pneumococcal polysaccharide vaccine 23-valent greater than or equal to 2yo subcutaneous/IM (Completed)       Follow up plan: Return in about 6 months (around 10/05/2017) for Physical Exam.

## 2017-04-05 ENCOUNTER — Encounter: Payer: Self-pay | Admitting: Family Medicine

## 2017-04-05 LAB — BASIC METABOLIC PANEL
BUN / CREAT RATIO: 10 (ref 10–24)
BUN: 14 mg/dL (ref 8–27)
CALCIUM: 9.2 mg/dL (ref 8.6–10.2)
CHLORIDE: 107 mmol/L — AB (ref 96–106)
CO2: 21 mmol/L (ref 20–29)
CREATININE: 1.34 mg/dL — AB (ref 0.76–1.27)
GFR calc Af Amer: 64 mL/min/{1.73_m2} (ref >59)
GFR calc non Af Amer: 55 mL/min/{1.73_m2} — ABNORMAL LOW (ref >59)
GLUCOSE: 114 mg/dL — AB (ref 65–99)
Potassium: 4.7 mmol/L (ref 3.5–5.2)
Sodium: 142 mmol/L (ref 134–144)

## 2017-04-17 DIAGNOSIS — I2510 Atherosclerotic heart disease of native coronary artery without angina pectoris: Secondary | ICD-10-CM | POA: Diagnosis not present

## 2017-04-17 DIAGNOSIS — E782 Mixed hyperlipidemia: Secondary | ICD-10-CM | POA: Diagnosis not present

## 2017-04-17 DIAGNOSIS — I10 Essential (primary) hypertension: Secondary | ICD-10-CM | POA: Diagnosis not present

## 2017-09-13 ENCOUNTER — Other Ambulatory Visit: Payer: Self-pay | Admitting: Family Medicine

## 2017-09-13 DIAGNOSIS — I10 Essential (primary) hypertension: Secondary | ICD-10-CM

## 2017-09-13 DIAGNOSIS — M7552 Bursitis of left shoulder: Secondary | ICD-10-CM

## 2017-09-13 DIAGNOSIS — E7800 Pure hypercholesterolemia, unspecified: Secondary | ICD-10-CM

## 2017-09-13 DIAGNOSIS — I2510 Atherosclerotic heart disease of native coronary artery without angina pectoris: Secondary | ICD-10-CM

## 2017-09-22 ENCOUNTER — Ambulatory Visit (INDEPENDENT_AMBULATORY_CARE_PROVIDER_SITE_OTHER): Payer: Medicare Other

## 2017-09-22 VITALS — BP 129/76 | HR 64 | Temp 98.2°F | Resp 17 | Ht 72.0 in | Wt 254.4 lb

## 2017-09-22 DIAGNOSIS — Z0000 Encounter for general adult medical examination without abnormal findings: Secondary | ICD-10-CM | POA: Diagnosis not present

## 2017-09-22 DIAGNOSIS — Z23 Encounter for immunization: Secondary | ICD-10-CM

## 2017-09-22 NOTE — Progress Notes (Signed)
Subjective:   Andre Gallegos is a 66 y.o. male who presents for an Initial Medicare Annual Wellness Visit.  Review of Systems   Cardiac Risk Factors include: hypertension;male gender;advanced age (>65men, >32 women);obesity (BMI >30kg/m2);dyslipidemia;smoking/ tobacco exposure    Objective:    Today's Vitals   09/22/17 0814  BP: 129/76  Pulse: 64  Resp: 17  Temp: 98.2 F (36.8 C)  TempSrc: Oral  Weight: 254 lb 6.4 oz (115.4 kg)  Height: 6' (1.829 m)   Body mass index is 34.5 kg/m.  Advanced Directives 09/22/2017 11/01/2016  Does Patient Have a Medical Advance Directive? No No  Does patient want to make changes to medical advance directive? Yes (MAU/Ambulatory/Procedural Areas - Information given) -  Would patient like information on creating a medical advance directive? - No - Patient declined    Current Medications (verified) Outpatient Encounter Medications as of 09/22/2017  Medication Sig  . amLODipine (NORVASC) 10 MG tablet TAKE ONE (1) TABLET BY MOUTH EVERY DAY  . aspirin (ASPIRIN EC) 81 MG EC tablet Take 81 mg by mouth daily. Swallow whole.  Marland Kitchen atorvastatin (LIPITOR) 40 MG tablet TAKE ONE (1) TABLET BY MOUTH EVERY DAY AT 6PM  . carvedilol (COREG) 25 MG tablet Take 1 tablet (25 mg total) by mouth 2 (two) times daily with a meal.  . ezetimibe (ZETIA) 10 MG tablet TAKE ONE (1) TABLET BY MOUTH EVERY DAY  . Lactobacillus (PROBIOTIC ACIDOPHILUS PO) Take 1 capsule by mouth.  Marland Kitchen lisinopril (PRINIVIL,ZESTRIL) 40 MG tablet Take 1 tablet (40 mg total) by mouth daily.  . meloxicam (MOBIC) 7.5 MG tablet TAKE ONE (1) TABLET BY MOUTH EVERY DAY   No facility-administered encounter medications on file as of 09/22/2017.     Allergies (verified) Patient has no known allergies.   History: Past Medical History:  Diagnosis Date  . Arthritis   . CAD (coronary artery disease)   . Hyperlipidemia   . Myocardial infarction [facility]) 2005   Past Surgical History:  Procedure  Laterality Date  . APPENDECTOMY    . CARDIAC CATHETERIZATION    . COLONOSCOPY WITH PROPOFOL N/A 11/01/2016   Procedure: COLONOSCOPY WITH PROPOFOL;  Surgeon: Jonathon Bellows, MD;  Location: [facility] ENDOSCOPY;  Service: Endoscopy;  Laterality: N/A;  . CORONARY ANGIOPLASTY     Family History  Problem Relation Age of Onset  . Lung cancer Father    Social History   Socioeconomic History  . Marital status: Married    Spouse name: None  . Number of children: None  . Years of education: None  . Highest education level: None  Social Needs  . Financial resource strain: Not very hard  . Food insecurity - worry: Never true  . Food insecurity - inability: Never true  . Transportation needs - medical: No  . Transportation needs - non-medical: No  Occupational History  . None  Tobacco Use  . Smoking status: Former Smoker    Types: Cigarettes    Last attempt to quit: 08/30/1985    Years since quitting: 32.0  . Smokeless tobacco: Current User    Types: Snuff  Substance and Sexual Activity  . Alcohol use: Yes    Comment: once a year  . Drug use: No  . Sexual activity: None  Other Topics Concern  . None  Social History Narrative  . None   Tobacco Counseling Ready to quit: Not Answered Counseling given: Not Answered   Clinical Intake:  Pre-visit preparation completed: Yes  Pain : No/denies pain  Nutritional Status: BMI > 30  Obese Nutritional Risks: None Diabetes: No  How often do you need to have someone help you when you read instructions, pamphlets, or other written materials from your doctor or pharmacy?: 1 - Never What is the last grade level you completed in school?: high school with some college   Interpreter Needed?: No  Information entered by :: Corda Shutt,LPN   Activities of Daily Living In your present state of health, do you have any difficulty performing the following activities: 09/22/2017  Hearing? Y  Comment has hearing test done annually  Vision? Y    Difficulty concentrating or making decisions? N  Walking or climbing stairs? N  Dressing or bathing? N  Doing errands, shopping? N  Preparing Food and eating ? N  Using the Toilet? N  In the past six months, have you accidently leaked urine? N  Do you have problems with loss of bowel control? N  Managing your Medications? N  Managing your Finances? N  Housekeeping or managing your Housekeeping? N  Some recent data might be hidden     Immunizations and Health Maintenance Immunization History  Administered Date(s) Administered  . Influenza, High Dose Seasonal PF 09/07/2016, 09/22/2017  . Influenza,inj,Quad PF,6+ Mos 08/31/2015  . Pneumococcal Polysaccharide-23 04/04/2017  . Pneumococcal-Unspecified 09/26/2005  . Tdap 08/26/2014  . Zoster 08/21/2013   There are no preventive care reminders to display for this patient.  Patient Care Team: Guadalupe Maple, MD as PCP - General (Family Medicine)  Indicate any recent Medical Services you may have received from other than [facility] providers in the past year (date may be approximate).    Assessment:   This is a routine wellness examination for Andre Gallegos.  Hearing/Vision screen Vision Screening Comments: Goes to [facility] annually  Dietary issues and exercise activities discussed: Current Exercise Habits: The patient does not participate in regular exercise at present, Intensity: Mild, Exercise limited by: None identified  Goals    . DIET - INCREASE WATER INTAKE     Recommend drinking at least 8 glasses of water a day       Depression Screen PHQ 2/9 Scores 09/22/2017 04/04/2017 09/07/2016 08/31/2015  PHQ - 2 Score 0 0 0 0  PHQ- 9 Score - - - 0    Fall Risk Fall Risk  09/22/2017 04/04/2017 09/07/2016  Falls in the past year? No No No    Is the patient's home free of loose throw rugs in walkways, pet beds, electrical cords, etc?   yes      Grab bars in the bathroom? No       Handrails on the stairs?   yes       Adequate lighting?   yes  Timed Get Up and Go performed: completed in 6 seconds with no use of assistive devices, steady gait. No intervention needed at this time  Cognitive Function:     6CIT Screen 09/22/2017  What Year? 0 points  What month? 0 points  What time? 0 points  Count back from 20 0 points  Months in reverse 0 points  Repeat phrase 0 points  Total Score 0    Screening Tests Health Maintenance  Topic Date Due  . PNA vac Low Risk Adult (2 of 2 - PCV13) 04/04/2018  . COLONOSCOPY  11/01/2021  . TETANUS/TDAP  08/26/2024  . INFLUENZA VACCINE  Completed  . Hepatitis C Screening  Completed    Qualifies for Shingles Vaccine? Completed zostavax in 2014, discussed  option for shingrix vaccine. Patient informed to go to his pharmacy if he decides he wants this.  Cancer Screenings: Lung: Low Dose CT Chest recommended if Age 77-80 years, 30 pack-year currently smoking OR have quit w/in 15years. Patient does not qualify. Colorectal: completed 11/01/2016  Additional Screenings: Hepatitis B/HIV/Syphillis: not indicated Hepatitis C Screening: completed 08/31/2015      Plan:    I have personally reviewed and addressed the Medicare Annual Wellness questionnaire and have noted the following in the patient's chart:  A. Medical and social history B. Use of alcohol, tobacco or illicit drugs  C. Current medications and supplements D. Functional ability and status E.  Nutritional status F.  Physical activity G. Advance directives H. List of other physicians I.  Hospitalizations, surgeries, and ER visits in previous 12 months J.  [place] such as hearing and vision if needed, cognitive and depression L. Referrals and appointments   In addition, I have reviewed and discussed with patient certain preventive protocols, quality metrics, and best practice recommendations. A written personalized care plan for preventive services as well as general preventive health  recommendations were provided to patient.   Signed,  Tyler Aas, LPN Nurse Health Advisor   Nurse Notes: none

## 2017-09-22 NOTE — Patient Instructions (Addendum)
Andre Gallegos , Thank you for taking time to come for your Medicare Wellness Visit. I appreciate your ongoing commitment to your health goals. Please review the following plan we discussed and let me know if I can assist you in the future.   Screening recommendations/referrals: Colonoscopy: completed 11/01/2016 Recommended yearly ophthalmology/optometry visit for glaucoma screening and checkup Recommended yearly dental visit for hygiene and checkup  Vaccinations: Influenza vaccine: done today  Pneumococcal vaccine: up to date  Tdap vaccine: up to date Shingles vaccine: up to date   Advanced directives: Advance directive discussed with you today. Even though you declined this today please call our office should you change your mind and we can give you the proper paperwork for you to fill out.  Conditions/risks identified: Recommend drinking at least 8 glasses of water a day   Next appointment: Follow up on 10/05/2017 at 8:00am with [place]. Follow up in one year for your annual well exam.   Preventive Care 65 Years and Older, Male Preventive care refers to lifestyle choices and visits with your health care provider that can promote health and wellness. What does preventive care include?  A yearly physical exam. This is also called an annual well check.  Dental exams once or twice a year.  Routine eye exams. Ask your health care provider how often you should have your eyes checked.  Personal lifestyle choices, including:  Daily care of your teeth and gums.  Regular physical activity.  Eating a healthy diet.  Avoiding tobacco and drug use.  Limiting alcohol use.  Practicing safe sex.  Taking low doses of aspirin every day.  Taking vitamin and mineral supplements as recommended by your health care provider. What happens during an annual well check? The services and screenings done by your health care provider during your annual well check will depend on your age, overall  health, lifestyle risk factors, and family history of disease. Counseling  Your health care provider may ask you questions about your:  Alcohol use.  Tobacco use.  Drug use.  Emotional well-being.  Home and relationship well-being.  Sexual activity.  Eating habits.  History of falls.  Memory and ability to understand (cognition).  Work and work Statistician. Screening  You may have the following tests or measurements:  Height, weight, and BMI.  Blood pressure.  Lipid and cholesterol levels. These may be checked every 5 years, or more frequently if you are over 59 years old.  Skin check.  Lung cancer screening. You may have this screening every year starting at age 2 if you have a 30-pack-year history of smoking and currently smoke or have quit within the past 15 years.  Fecal occult blood test (FOBT) of the stool. You may have this test every year starting at age 14.  Flexible sigmoidoscopy or colonoscopy. You may have a sigmoidoscopy every 5 years or a colonoscopy every 10 years starting at age 76.  Prostate cancer screening. Recommendations will vary depending on your family history and other risks.  Hepatitis C blood test.  Hepatitis B blood test.  Sexually transmitted disease (STD) testing.  Diabetes screening. This is done by checking your blood sugar (glucose) after you have not eaten for a while (fasting). You may have this done every 1-3 years.  Abdominal aortic aneurysm (AAA) screening. You may need this if you are a current or former smoker.  Osteoporosis. You may be screened starting at age 63 if you are at high risk. Talk with your health care provider  about your test results, treatment options, and if necessary, the need for more tests. Vaccines  Your health care provider may recommend certain vaccines, such as:  Influenza vaccine. This is recommended every year.  Tetanus, diphtheria, and acellular pertussis (Tdap, Td) vaccine. You may need a Td  booster every 10 years.  Zoster vaccine. You may need this after age 88.  Pneumococcal 13-valent conjugate (PCV13) vaccine. One dose is recommended after age 70.  Pneumococcal polysaccharide (PPSV23) vaccine. One dose is recommended after age 38. Talk to your health care provider about which screenings and vaccines you need and how often you need them. This information is not intended to replace advice given to you by your health care provider. Make sure you discuss any questions you have with your health care provider. Document Released: 10/09/2015 Document Revised: 06/01/2016 Document Reviewed: 07/14/2015 Elsevier Interactive Patient Education  2017 [employer] Prevention in the Home Falls can cause injuries. They can happen to people of all ages. There are many things you can do to make your home safe and to help prevent falls. What can I do on the outside of my home?  Regularly fix the edges of walkways and driveways and fix any cracks.  Remove anything that might make you trip as you walk through a door, such as a raised step or threshold.  Trim any bushes or trees on the path to your home.  Use bright outdoor lighting.  Clear any walking paths of anything that might make someone trip, such as rocks or tools.  Regularly check to see if handrails are loose or broken. Make sure that both sides of any steps have handrails.  Any raised decks and porches should have guardrails on the edges.  Have any leaves, snow, or ice cleared regularly.  Use sand or salt on walking paths during winter.  Clean up any spills in your garage right away. This includes oil or grease spills. What can I do in the bathroom?  Use night lights.  Install grab bars by the toilet and in the tub and shower. Do not use towel bars as grab bars.  Use non-skid mats or decals in the tub or shower.  If you need to sit down in the shower, use a plastic, non-slip stool.  Keep the floor dry. Clean up  any water that spills on the floor as soon as it happens.  Remove soap buildup in the tub or shower regularly.  Attach bath mats securely with double-sided non-slip rug tape.  Do not have throw rugs and other things on the floor that can make you trip. What can I do in the bedroom?  Use night lights.  Make sure that you have a light by your bed that is easy to reach.  Do not use any sheets or blankets that are too big for your bed. They should not hang down onto the floor.  Have a firm chair that has side arms. You can use this for support while you get dressed.  Do not have throw rugs and other things on the floor that can make you trip. What can I do in the kitchen?  Clean up any spills right away.  Avoid walking on wet floors.  Keep items that you use a lot in easy-to-reach places.  If you need to reach something above you, use a strong step stool that has a grab bar.  Keep electrical cords out of the way.  Do not use floor polish or  wax that makes floors slippery. If you must use wax, use non-skid floor wax.  Do not have throw rugs and other things on the floor that can make you trip. What can I do with my stairs?  Do not leave any items on the stairs.  Make sure that there are handrails on both sides of the stairs and use them. Fix handrails that are broken or loose. Make sure that handrails are as long as the stairways.  Check any carpeting to make sure that it is firmly attached to the stairs. Fix any carpet that is loose or worn.  Avoid having throw rugs at the top or bottom of the stairs. If you do have throw rugs, attach them to the floor with carpet tape.  Make sure that you have a light switch at the top of the stairs and the bottom of the stairs. If you do not have them, ask someone to add them for you. What else can I do to help prevent falls?  Wear shoes that:  Do not have high heels.  Have rubber bottoms.  Are comfortable and fit you well.  Are  closed at the toe. Do not wear sandals.  If you use a stepladder:  Make sure that it is fully opened. Do not climb a closed stepladder.  Make sure that both sides of the stepladder are locked into place.  Ask someone to hold it for you, if possible.  Clearly mark and make sure that you can see:  Any grab bars or handrails.  First and last steps.  Where the edge of each step is.  Use tools that help you move around (mobility aids) if they are needed. These include:  Canes.  Walkers.  Scooters.  Crutches.  Turn on the lights when you go into a dark area. Replace any light bulbs as soon as they burn out.  Set up your furniture so you have a clear path. Avoid moving your furniture around.  If any of your floors are uneven, fix them.  If there are any pets around you, be aware of where they are.  Review your medicines with your doctor. Some medicines can make you feel dizzy. This can increase your chance of falling. Ask your doctor what other things that you can do to help prevent falls. This information is not intended to replace advice given to you by your health care provider. Make sure you discuss any questions you have with your health care provider. Document Released: 07/09/2009 Document Revised: 02/18/2016 Document Reviewed: 10/17/2014 Elsevier Interactive Patient Education  2017 [employer].  Influenza (Flu) Vaccine (Inactivated or Recombinant): What You Need to Know 1. Why get vaccinated? Influenza ("flu") is a contagious disease that spreads around the [place] every year, usually between October and May. Flu is caused by influenza viruses, and is spread mainly by coughing, sneezing, and close contact. Anyone can get flu. Flu strikes suddenly and can last several days. Symptoms vary by age, but can include:  fever/chills  sore throat  muscle aches  fatigue  cough  headache  runny or stuffy nose  Flu can also lead to pneumonia and blood  infections, and cause diarrhea and seizures in children. If you have a medical condition, such as heart or lung disease, flu can make it worse. Flu is more dangerous for some people. Infants and young children, people 3 years of age and older, pregnant women, and people with certain health conditions or a weakened immune system are  at greatest risk. Each year thousands of people in the [place] die from flu, and many more are hospitalized. Flu vaccine can:  keep you from getting flu,  make flu less severe if you do get it, and  keep you from spreading flu to your family and other people. 2. Inactivated and recombinant flu vaccines A dose of flu vaccine is recommended every flu season. Children 6 months through 72 years of age may need two doses during the same flu season. Everyone else needs only one dose each flu season. Some inactivated flu vaccines contain a very small amount of a mercury-based preservative called thimerosal. Studies have not shown thimerosal in vaccines to be harmful, but flu vaccines that do not contain thimerosal are available. There is no live flu virus in flu shots. They cannot cause the flu. There are many flu viruses, and they are always changing. Each year a new flu vaccine is made to protect against three or four viruses that are likely to cause disease in the upcoming flu season. But even when the vaccine doesn't exactly match these viruses, it may still provide some protection. Flu vaccine cannot prevent:  flu that is caused by a virus not covered by the vaccine, or  illnesses that look like flu but are not.  It takes about 2 weeks for protection to develop after vaccination, and protection lasts through the flu season. 3. Some people should not get this vaccine Tell the person who is giving you the vaccine:  If you have any severe, life-threatening allergies. If you ever had a life-threatening allergic reaction after a dose of flu vaccine, or have a  severe allergy to any part of this vaccine, you may be advised not to get vaccinated. Most, but not all, types of flu vaccine contain a small amount of egg protein.  If you ever had Guillain-Barr Syndrome (also called GBS). Some people with a history of GBS should not get this vaccine. This should be discussed with your doctor.  If you are not feeling well. It is usually okay to get flu vaccine when you have a mild illness, but you might be asked to come back when you feel better.  4. Risks of a vaccine reaction With any medicine, including vaccines, there is a chance of reactions. These are usually mild and go away on their own, but serious reactions are also possible. Most people who get a flu shot do not have any problems with it. Minor problems following a flu shot include:  soreness, redness, or swelling where the shot was given  hoarseness  sore, red or itchy eyes  cough  fever  aches  headache  itching  fatigue  If these problems occur, they usually begin soon after the shot and last 1 or 2 days. More serious problems following a flu shot can include the following:  There may be a small increased risk of Guillain-Barre Syndrome (GBS) after inactivated flu vaccine. This risk has been estimated at 1 or 2 additional cases per million people vaccinated. This is much lower than the risk of severe complications from flu, which can be prevented by flu vaccine.  Young children who get the flu shot along with pneumococcal vaccine (PCV13) and/or DTaP vaccine at the same time might be slightly more likely to have a seizure caused by fever. Ask your doctor for more information. Tell your doctor if a child who is getting flu vaccine has ever had a seizure.  Problems that could happen  after any injected vaccine:  People sometimes faint after a medical procedure, including vaccination. Sitting or lying down for about 15 minutes can help prevent fainting, and injuries caused by a fall.  Tell your doctor if you feel dizzy, or have vision changes or ringing in the ears.  Some people get severe pain in the shoulder and have difficulty moving the arm where a shot was given. This happens very rarely.  Any medication can cause a severe allergic reaction. Such reactions from a vaccine are very rare, estimated at about 1 in a million doses, and would happen within a few minutes to a few hours after the vaccination. As with any medicine, there is a very remote chance of a vaccine causing a serious injury or death. The safety of vaccines is always being monitored. For more information, visit: http://www.aguilar.org/ 5. What if there is a serious reaction? What should I look for? Look for anything that concerns you, such as signs of a severe allergic reaction, very high fever, or unusual behavior. Signs of a severe allergic reaction can include hives, swelling of the face and throat, difficulty breathing, a fast heartbeat, dizziness, and weakness. These would start a few minutes to a few hours after the vaccination. What should I do?  If you think it is a severe allergic reaction or other emergency that can't wait, call 9-1-1 and get the person to the nearest hospital. Otherwise, call your doctor.  Reactions should be reported to the Vaccine Adverse Event Reporting System (VAERS). Your doctor should file this report, or you can do it yourself through the VAERS web site at www.vaers.SamedayNews.es, or by calling 2296946161. ? VAERS does not give medical advice. 6. The National Vaccine Injury Compensation Program The [employer] Vaccine Injury Compensation Program (VICP) is a federal program that was created to compensate people who may have been injured by certain vaccines. Persons who believe they may have been injured by a vaccine can learn about the program and about filing a claim by calling 214-207-7549 or visiting the [place] website at GoldCloset.com.ee. There is a time  limit to file a claim for compensation. 7. How can I learn more?  Ask your healthcare provider. He or she can give you the vaccine package insert or suggest other sources of information.  Call your local or state health department.  Contact the Centers for Disease Control and Prevention (CDC): ? Call 340-777-0084 (1-800-CDC-INFO) or ? Visit CDC's website at https://gibson.com/ Vaccine Information Statement, Inactivated Influenza Vaccine (05/02/2014) This information is not intended to replace advice given to you by your health care provider. Make sure you discuss any questions you have with your health care provider. Document Released: 07/07/2006 Document Revised: 06/02/2016 Document Reviewed: 06/02/2016 Elsevier Interactive Patient Education  2017 [employer].

## 2017-09-27 ENCOUNTER — Other Ambulatory Visit: Payer: Self-pay | Admitting: Family Medicine

## 2017-09-27 DIAGNOSIS — I2510 Atherosclerotic heart disease of native coronary artery without angina pectoris: Secondary | ICD-10-CM

## 2017-10-04 ENCOUNTER — Other Ambulatory Visit: Payer: Self-pay | Admitting: Family Medicine

## 2017-10-04 DIAGNOSIS — I10 Essential (primary) hypertension: Secondary | ICD-10-CM

## 2017-10-05 ENCOUNTER — Ambulatory Visit (INDEPENDENT_AMBULATORY_CARE_PROVIDER_SITE_OTHER): Payer: Medicare Other | Admitting: Family Medicine

## 2017-10-05 ENCOUNTER — Encounter: Payer: Self-pay | Admitting: Family Medicine

## 2017-10-05 VITALS — BP 147/81 | HR 67 | Ht 72.0 in | Wt 256.0 lb

## 2017-10-05 DIAGNOSIS — M720 Palmar fascial fibromatosis [Dupuytren]: Secondary | ICD-10-CM

## 2017-10-05 DIAGNOSIS — I10 Essential (primary) hypertension: Secondary | ICD-10-CM

## 2017-10-05 DIAGNOSIS — Z0001 Encounter for general adult medical examination with abnormal findings: Secondary | ICD-10-CM

## 2017-10-05 DIAGNOSIS — I2510 Atherosclerotic heart disease of native coronary artery without angina pectoris: Secondary | ICD-10-CM

## 2017-10-05 DIAGNOSIS — E7800 Pure hypercholesterolemia, unspecified: Secondary | ICD-10-CM | POA: Diagnosis not present

## 2017-10-05 DIAGNOSIS — L821 Other seborrheic keratosis: Secondary | ICD-10-CM | POA: Diagnosis not present

## 2017-10-05 DIAGNOSIS — Z1329 Encounter for screening for other suspected endocrine disorder: Secondary | ICD-10-CM

## 2017-10-05 DIAGNOSIS — N400 Enlarged prostate without lower urinary tract symptoms: Secondary | ICD-10-CM | POA: Diagnosis not present

## 2017-10-05 LAB — URINALYSIS, ROUTINE W REFLEX MICROSCOPIC
BILIRUBIN UA: NEGATIVE
Glucose, UA: NEGATIVE
Ketones, UA: NEGATIVE
LEUKOCYTES UA: NEGATIVE
NITRITE UA: NEGATIVE
Protein, UA: NEGATIVE
Specific Gravity, UA: 1.02 (ref 1.005–1.030)
Urobilinogen, Ur: 0.2 mg/dL (ref 0.2–1.0)
pH, UA: 5 (ref 5.0–7.5)

## 2017-10-05 LAB — MICROSCOPIC EXAMINATION: BACTERIA UA: NONE SEEN

## 2017-10-05 MED ORDER — LISINOPRIL 40 MG PO TABS: 40.0000 mg | ORAL_TABLET | Freq: Every day | ORAL | 12 refills | Status: DC

## 2017-10-05 NOTE — Progress Notes (Signed)
BP (!) 147/81   Pulse 67   Ht 6' (1.829 m)   Wt 256 lb (116.1 kg)   SpO2 96%   BMI 34.72 kg/m    Subjective:    Patient ID: Andre Gallegos, male    DOB: [birth year], 67 y.o.   MRN: [id number]  HPI: Andre Gallegos is a 67 y.o. male  Chief Complaint  Patient presents with  . Annual Exam   Patient has contractures of both hands of Dupuytren's discussed and will observe. Patient also with lesion on his right nose of seborrheic keratosis which gets irritated with his glasses. Otherwise patient all in all doing well taking cholesterol medicines  Blood pressure without problems or issues No heart symptoms or problems. Takes meloxicam occasionally for neck arthritis Relevant past medical, surgical, family and social history reviewed and updated as indicated. Interim medical history since our last visit reviewed. Allergies and medications reviewed and updated.  Review of Systems  Constitutional: Negative.   HENT: Negative.   Eyes: Negative.   Respiratory: Negative.   Cardiovascular: Negative.   Gastrointestinal: Negative.   Endocrine: Negative.   Genitourinary: Negative.   Musculoskeletal: Negative.   Skin: Negative.   Allergic/Immunologic: Negative.   Neurological: Negative.   Hematological: Negative.   Psychiatric/Behavioral: Negative.     Per HPI unless specifically indicated above     Objective:    BP (!) 147/81   Pulse 67   Ht 6' (1.829 m)   Wt 256 lb (116.1 kg)   SpO2 96%   BMI 34.72 kg/m   Wt Readings from Last 3 Encounters:  10/05/17 256 lb (116.1 kg)  09/22/17 254 lb 6.4 oz (115.4 kg)  04/04/17 247 lb (112 kg)    Physical Exam  Constitutional: He is oriented to person, place, and time. He appears well-developed and well-nourished.  HENT:  Head: Normocephalic and atraumatic.  Right Ear: External ear normal.  Left Ear: External ear normal.  Eyes: Conjunctivae and EOM are normal. Pupils are equal, round, and reactive to light.  Neck: Normal range  of motion. Neck supple.  Cardiovascular: Normal rate, regular rhythm, normal heart sounds and intact distal pulses.  Pulmonary/Chest: Effort normal and breath sounds normal.  Abdominal: Soft. Bowel sounds are normal. There is no splenomegaly or hepatomegaly.  Genitourinary: Rectum normal, prostate normal and penis normal.  Musculoskeletal: Normal range of motion.  Neurological: He is alert and oriented to person, place, and time. He has normal reflexes.  Skin: No rash noted. No erythema.  SK on nose  Psychiatric: He has a normal mood and affect. His behavior is normal. Judgment and thought content normal.    Results for orders placed or performed in visit on 81/85/63  Basic metabolic panel  Result Value Ref Range   Glucose 114 (H) 65 - 99 mg/dL   BUN 14 8 - 27 mg/dL   Creatinine, Ser 1.34 (H) 0.76 - 1.27 mg/dL   GFR calc non Af Amer 55 (L) >59 mL/min/1.73   GFR calc Af Amer 64 >59 mL/min/1.73   BUN/Creatinine Ratio 10 10 - 24   Sodium 142 134 - 144 mmol/L   Potassium 4.7 3.5 - 5.2 mmol/L   Chloride 107 (H) 96 - 106 mmol/L   CO2 21 20 - 29 mmol/L   Calcium 9.2 8.6 - 10.2 mg/dL  LP+ALT+AST Piccolo, Waived  Result Value Ref Range   ALT (SGPT) Piccolo, Waived 32 10 - 47 U/L   AST (SGOT) Piccolo, Waived 25 11 - 38  U/L   Cholesterol Piccolo, Waived 109 <200 mg/dL   HDL Chol Piccolo, Waived 29 (L) >59 mg/dL   Triglycerides Piccolo,Waived 75 <150 mg/dL   Chol/HDL Ratio Piccolo,Waive 3.7 mg/dL   LDL Chol Calc Piccolo Waived 65 <100 mg/dL   VLDL Chol Calc Piccolo,Waive 15 <30 mg/dL      Assessment & Plan:   Problem List Items Addressed This Visit      Cardiovascular and Mediastinum   CAD (coronary artery disease)    The current medical regimen is effective;  continue present plan and medications.       Relevant Medications   lisinopril (PRINIVIL,ZESTRIL) 40 MG tablet   Other Relevant Orders   CBC with Differential/Platelet   Comprehensive metabolic panel   Urinalysis,  Routine w reflex microscopic   Essential hypertension - Primary   Relevant Medications   lisinopril (PRINIVIL,ZESTRIL) 40 MG tablet   Other Relevant Orders   CBC with Differential/Platelet   Comprehensive metabolic panel   Urinalysis, Routine w reflex microscopic     Musculoskeletal and Integument   Dupuytren's contracture of both hands    Discussed care and treatment of Dupuytren's      Seborrheic keratosis    Lesion on right nose destroyed with cryo-unit patient education given on care        Genitourinary   BPH (benign prostatic hyperplasia)    The current medical regimen is effective;  continue present plan and medications.       Relevant Orders   PSA     Other   Hyperlipidemia    The current medical regimen is effective;  continue present plan and medications.       Relevant Medications   lisinopril (PRINIVIL,ZESTRIL) 40 MG tablet   Other Relevant Orders   Lipid panel    Other Visit Diagnoses    Thyroid disorder screen       Relevant Orders   TSH       Follow up plan: Return in about 6 months (around 04/04/2018) for BMP,  Lipids, ALT, AST.

## 2017-10-05 NOTE — Assessment & Plan Note (Signed)
Discussed care and treatment of Dupuytren's

## 2017-10-05 NOTE — Assessment & Plan Note (Signed)
The current medical regimen is effective;  continue present plan and medications.  

## 2017-10-05 NOTE — Assessment & Plan Note (Signed)
Lesion on right nose destroyed with cryo-unit patient education given on care

## 2017-10-06 LAB — COMPREHENSIVE METABOLIC PANEL
ALT: 32 IU/L (ref 0–44)
AST: 23 IU/L (ref 0–40)
Albumin/Globulin Ratio: 2 (ref 1.2–2.2)
Albumin: 4.3 g/dL (ref 3.6–4.8)
Alkaline Phosphatase: 66 IU/L (ref 39–117)
BUN/Creatinine Ratio: 11 (ref 10–24)
BUN: 16 mg/dL (ref 8–27)
Bilirubin Total: 0.6 mg/dL (ref 0.0–1.2)
CALCIUM: 9 mg/dL (ref 8.6–10.2)
CO2: 22 mmol/L (ref 20–29)
CREATININE: 1.42 mg/dL — AB (ref 0.76–1.27)
Chloride: 108 mmol/L — ABNORMAL HIGH (ref 96–106)
GFR, EST AFRICAN AMERICAN: 59 mL/min/{1.73_m2} — AB (ref >59)
GFR, EST NON AFRICAN AMERICAN: 51 mL/min/{1.73_m2} — AB (ref >59)
GLUCOSE: 110 mg/dL — AB (ref 65–99)
Globulin, Total: 2.2 g/dL (ref 1.5–4.5)
Potassium: 4.3 mmol/L (ref 3.5–5.2)
Sodium: 143 mmol/L (ref 134–144)
Total Protein: 6.5 g/dL (ref 6.0–8.5)

## 2017-10-06 LAB — CBC WITH DIFFERENTIAL/PLATELET
BASOS: 1 %
Basophils Absolute: 0 10*3/uL (ref 0.0–0.2)
EOS (ABSOLUTE): 0.3 10*3/uL (ref 0.0–0.4)
Eos: 4 %
Hematocrit: 42.1 % (ref 37.5–51.0)
Hemoglobin: 14.6 g/dL (ref 13.0–17.7)
IMMATURE GRANS (ABS): 0 10*3/uL (ref 0.0–0.1)
IMMATURE GRANULOCYTES: 0 %
LYMPHS: 24 %
Lymphocytes Absolute: 1.7 10*3/uL (ref 0.7–3.1)
MCH: 31.3 pg (ref 26.6–33.0)
MCHC: 34.7 g/dL (ref 31.5–35.7)
MCV: 90 fL (ref 79–97)
Monocytes Absolute: 0.6 10*3/uL (ref 0.1–0.9)
Monocytes: 8 %
NEUTROS PCT: 63 %
Neutrophils Absolute: 4.3 10*3/uL (ref 1.4–7.0)
PLATELETS: 186 10*3/uL (ref 150–379)
RBC: 4.66 x10E6/uL (ref 4.14–5.80)
RDW: 13.7 % (ref 12.3–15.4)
WBC: 6.9 10*3/uL (ref 3.4–10.8)

## 2017-10-06 LAB — LIPID PANEL
CHOL/HDL RATIO: 3.3 ratio (ref 0.0–5.0)
Cholesterol, Total: 102 mg/dL (ref 100–199)
HDL: 31 mg/dL — AB (ref >39)
LDL Calculated: 55 mg/dL (ref 0–99)
Triglycerides: 81 mg/dL (ref 0–149)
VLDL CHOLESTEROL CAL: 16 mg/dL (ref 5–40)

## 2017-10-06 LAB — PSA: PROSTATE SPECIFIC AG, SERUM: 1 ng/mL (ref 0.0–4.0)

## 2017-10-06 LAB — TSH: TSH: 3.05 u[IU]/mL (ref 0.450–4.500)

## 2017-10-09 ENCOUNTER — Encounter: Payer: Self-pay | Admitting: Family Medicine

## 2018-04-05 ENCOUNTER — Encounter: Payer: Self-pay | Admitting: Family Medicine

## 2018-04-05 ENCOUNTER — Ambulatory Visit (INDEPENDENT_AMBULATORY_CARE_PROVIDER_SITE_OTHER): Payer: Medicare Other | Admitting: Family Medicine

## 2018-04-05 VITALS — BP 138/80 | HR 63 | Ht 70.0 in | Wt 260.0 lb

## 2018-04-05 DIAGNOSIS — E7800 Pure hypercholesterolemia, unspecified: Secondary | ICD-10-CM | POA: Diagnosis not present

## 2018-04-05 DIAGNOSIS — I214 Non-ST elevation (NSTEMI) myocardial infarction: Secondary | ICD-10-CM | POA: Diagnosis not present

## 2018-04-05 DIAGNOSIS — M720 Palmar fascial fibromatosis [Dupuytren]: Secondary | ICD-10-CM

## 2018-04-05 DIAGNOSIS — I10 Essential (primary) hypertension: Secondary | ICD-10-CM | POA: Diagnosis not present

## 2018-04-05 LAB — LP+ALT+AST PICCOLO, WAIVED
ALT (SGPT) PICCOLO, WAIVED: 40 U/L (ref 10–47)
AST (SGOT) PICCOLO, WAIVED: 24 U/L (ref 11–38)
CHOLESTEROL PICCOLO, WAIVED: 108 mg/dL (ref <200)
Chol/HDL Ratio Piccolo,Waive: 3.6 mg/dL
HDL CHOL PICCOLO, WAIVED: 30 mg/dL — AB (ref >59)
LDL CHOL CALC PICCOLO WAIVED: 60 mg/dL (ref <100)
Triglycerides Piccolo,Waived: 85 mg/dL (ref <150)
VLDL Chol Calc Piccolo,Waive: 17 mg/dL (ref <30)

## 2018-04-05 NOTE — Assessment & Plan Note (Signed)
The current medical regimen is effective;  continue present plan and medications.  

## 2018-04-05 NOTE — Progress Notes (Signed)
BP 138/80 (BP Location: Left Arm)   Pulse 63   Ht 5\' 10"  (1.778 m)   Wt 260 lb (117.9 kg)   SpO2 96%   BMI 37.31 kg/m    Subjective:    Patient ID: Andre Gallegos, male    DOB: November 15, 1950, 67 y.o.   MRN: [id number]  HPI: Andre Gallegos is a 67 y.o. male  Chief Complaint  Patient presents with  . Follow-up  . Hypertension  . Hyperlipidemia  Patient follow-up all in all doing well concerned with summertime less active because of heat exposure and some weight gain. Blood pressure medications doing well with no complaints good control of blood pressure. Cholesterol doing well with no complaints. Takes meloxicam without problems for arthralgias. Dupuytren's contracture still about the same.  Relevant past medical, surgical, family and social history reviewed and updated as indicated. Interim medical history since our last visit reviewed. Allergies and medications reviewed and updated.  Review of Systems  Constitutional: Negative.   Respiratory: Negative.   Cardiovascular: Negative.     Per HPI unless specifically indicated above     Objective:    BP 138/80 (BP Location: Left Arm)   Pulse 63   Ht 5\' 10"  (1.778 m)   Wt 260 lb (117.9 kg)   SpO2 96%   BMI 37.31 kg/m   Wt Readings from Last 3 Encounters:  04/05/18 260 lb (117.9 kg)  10/05/17 256 lb (116.1 kg)  09/22/17 254 lb 6.4 oz (115.4 kg)    Physical Exam  Constitutional: He is oriented to person, place, and time. He appears well-developed and well-nourished.  HENT:  Head: Normocephalic and atraumatic.  Eyes: Conjunctivae and EOM are normal.  Neck: Normal range of motion.  Cardiovascular: Normal rate, regular rhythm and normal heart sounds.  Pulmonary/Chest: Effort normal and breath sounds normal.  Musculoskeletal: Normal range of motion.  Neurological: He is alert and oriented to person, place, and time.  Skin: No erythema.  Psychiatric: He has a normal mood and affect. His behavior is normal.  Judgment and thought content normal.    Results for orders placed or performed in visit on 10/05/17  Microscopic Examination  Result Value Ref Range   WBC, UA 0-5 0 - 5 /hpf   RBC, UA 0-2 0 - 2 /hpf   Epithelial Cells (non renal) 0-10 0 - 10 /hpf   Bacteria, UA None seen None seen/Few  CBC with Differential/Platelet  Result Value Ref Range   WBC 6.9 3.4 - 10.8 x10E3/uL   RBC 4.66 4.14 - 5.80 x10E6/uL   Hemoglobin 14.6 13.0 - 17.7 g/dL   Hematocrit 42.1 37.5 - 51.0 %   MCV 90 79 - 97 fL   MCH 31.3 26.6 - 33.0 pg   MCHC 34.7 31.5 - 35.7 g/dL   RDW 13.7 12.3 - 15.4 %   Platelets 186 150 - 379 x10E3/uL   Neutrophils 63 Not Estab. %   Lymphs 24 Not Estab. %   Monocytes 8 Not Estab. %   Eos 4 Not Estab. %   Basos 1 Not Estab. %   Neutrophils Absolute 4.3 1.4 - 7.0 x10E3/uL   Lymphocytes Absolute 1.7 0.7 - 3.1 x10E3/uL   Monocytes Absolute 0.6 0.1 - 0.9 x10E3/uL   EOS (ABSOLUTE) 0.3 0.0 - 0.4 x10E3/uL   Basophils Absolute 0.0 0.0 - 0.2 x10E3/uL   Immature Granulocytes 0 Not Estab. %   Immature Grans (Abs) 0.0 0.0 - 0.1 x10E3/uL  Comprehensive metabolic panel  Result  Value Ref Range   Glucose 110 (H) 65 - 99 mg/dL   BUN 16 8 - 27 mg/dL   Creatinine, Ser 1.42 (H) 0.76 - 1.27 mg/dL   GFR calc non Af Amer 51 (L) >59 mL/min/1.73   GFR calc Af Amer 59 (L) >59 mL/min/1.73   BUN/Creatinine Ratio 11 10 - 24   Sodium 143 134 - 144 mmol/L   Potassium 4.3 3.5 - 5.2 mmol/L   Chloride 108 (H) 96 - 106 mmol/L   CO2 22 20 - 29 mmol/L   Calcium 9.0 8.6 - 10.2 mg/dL   Total Protein 6.5 6.0 - 8.5 g/dL   Albumin 4.3 3.6 - 4.8 g/dL   Globulin, Total 2.2 1.5 - 4.5 g/dL   Albumin/Globulin Ratio 2.0 1.2 - 2.2   Bilirubin Total 0.6 0.0 - 1.2 mg/dL   Alkaline Phosphatase 66 39 - 117 IU/L   AST 23 0 - 40 IU/L   ALT 32 0 - 44 IU/L  Lipid panel  Result Value Ref Range   Cholesterol, Total 102 100 - 199 mg/dL   Triglycerides 81 0 - 149 mg/dL   HDL 31 (L) >39 mg/dL   VLDL Cholesterol Cal 16 5 -  40 mg/dL   LDL Calculated 55 0 - 99 mg/dL   Chol/HDL Ratio 3.3 0.0 - 5.0 ratio  PSA  Result Value Ref Range   Prostate Specific Ag, Serum 1.0 0.0 - 4.0 ng/mL  TSH  Result Value Ref Range   TSH 3.050 0.450 - 4.500 uIU/mL  Urinalysis, Routine w reflex microscopic  Result Value Ref Range   Specific Gravity, UA 1.020 1.005 - 1.030   pH, UA 5.0 5.0 - 7.5   Color, UA Yellow Yellow   Appearance Ur Clear Clear   Leukocytes, UA Negative Negative   Protein, UA Negative Negative/Trace   Glucose, UA Negative Negative   Ketones, UA Negative Negative   RBC, UA Trace (A) Negative   Bilirubin, UA Negative Negative   Urobilinogen, Ur 0.2 0.2 - 1.0 mg/dL   Nitrite, UA Negative Negative   Microscopic Examination See below:       Assessment & Plan:   Problem List Items Addressed This Visit      Cardiovascular and Mediastinum   Benign essential hypertension - Primary    The current medical regimen is effective;  continue present plan and medications.       Non-ST elevation myocardial infarction (NSTEMI), subendocardial infarction, subsequent episode of care [facility])    The current medical regimen is effective;  continue present plan and medications.         Musculoskeletal and Integument   Dupuytren's contracture of both hands    The current medical regimen is effective;  continue present plan and medications.         Other   Hyperlipemia, mixed    The current medical regimen is effective;  continue present plan and medications.           Follow up plan: Return in about 6 months (around 10/06/2018) for Physical Exam.

## 2018-04-06 LAB — BASIC METABOLIC PANEL
BUN/Creatinine Ratio: 10 (ref 10–24)
BUN: 15 mg/dL (ref 8–27)
CALCIUM: 9.5 mg/dL (ref 8.6–10.2)
CO2: 23 mmol/L (ref 20–29)
Chloride: 106 mmol/L (ref 96–106)
Creatinine, Ser: 1.44 mg/dL — ABNORMAL HIGH (ref 0.76–1.27)
GFR, EST AFRICAN AMERICAN: 58 mL/min/{1.73_m2} — AB (ref >59)
GFR, EST NON AFRICAN AMERICAN: 50 mL/min/{1.73_m2} — AB (ref >59)
Glucose: 108 mg/dL — ABNORMAL HIGH (ref 65–99)
Potassium: 4.9 mmol/L (ref 3.5–5.2)
Sodium: 142 mmol/L (ref 134–144)

## 2018-04-09 ENCOUNTER — Encounter: Payer: Self-pay | Admitting: Family Medicine

## 2018-09-24 ENCOUNTER — Other Ambulatory Visit: Payer: Self-pay | Admitting: Family Medicine

## 2018-09-24 DIAGNOSIS — I10 Essential (primary) hypertension: Secondary | ICD-10-CM

## 2018-09-25 NOTE — Telephone Encounter (Signed)
Requested Prescriptions  Pending Prescriptions Disp Refills  . amLODipine (NORVASC) 10 MG tablet [Pharmacy Med Name: AMLODIPINE BESYLATE 10 MG TAB] 30 tablet 12    Sig: TAKE ONE TABLET BY MOUTH EVERY DAY     Cardiovascular:  Calcium Channel Blockers Passed - 09/24/2018  5:14 PM      Passed - Last BP in normal range    BP Readings from Last 1 Encounters:  04/05/18 138/80         Passed - Valid encounter within last 6 months    Recent Outpatient Visits          5 months ago Essential hypertension   [facility], Jeannette How, MD   11 months ago Essential hypertension   [facility], Jeannette How, MD   1 year ago Need for pneumococcal vaccination   [facility], Jeannette How, MD   1 year ago Irritant contact dermatitis due to plants, except food   [facility] Crissman, Jeannette How, MD   2 years ago Need for influenza vaccination   [facility], [place]      Future Appointments            In 2 weeks Crissman, Jeannette How, MD [facility]

## 2018-10-05 ENCOUNTER — Telehealth: Payer: Self-pay | Admitting: Family Medicine

## 2018-10-05 NOTE — Telephone Encounter (Signed)
Copied from [place] [id number]. Topic: Medicare AWV >> Oct 05, 2018  2:38 PM Sherren Kerns wrote: Called to schedule Medicare Annual Wellness Visit with the Nurse Health Advisor. Spoke with wife, Jana Half and she states will get him to call me back.  I provided her with the phone number below.  If patient returns call, please note: their last AWV was on 09/22/17 please schedule AWV with [facility] any date AFTER 09/23/2018  Thank you! For any questions please contact: Janace Hoard at [phone] or Skype [email or web address]

## 2018-10-15 ENCOUNTER — Encounter: Payer: Self-pay | Admitting: Family Medicine

## 2018-10-15 ENCOUNTER — Ambulatory Visit: Payer: Medicare Other | Admitting: Family Medicine

## 2018-10-15 VITALS — BP 140/72 | HR 62 | Temp 97.8°F | Ht 70.47 in | Wt 238.0 lb

## 2018-10-15 DIAGNOSIS — N400 Enlarged prostate without lower urinary tract symptoms: Secondary | ICD-10-CM

## 2018-10-15 DIAGNOSIS — Z7189 Other specified counseling: Secondary | ICD-10-CM

## 2018-10-15 DIAGNOSIS — I2510 Atherosclerotic heart disease of native coronary artery without angina pectoris: Secondary | ICD-10-CM

## 2018-10-15 DIAGNOSIS — I10 Essential (primary) hypertension: Secondary | ICD-10-CM

## 2018-10-15 DIAGNOSIS — Z23 Encounter for immunization: Secondary | ICD-10-CM | POA: Diagnosis not present

## 2018-10-15 DIAGNOSIS — E7800 Pure hypercholesterolemia, unspecified: Secondary | ICD-10-CM | POA: Diagnosis not present

## 2018-10-15 DIAGNOSIS — Z1329 Encounter for screening for other suspected endocrine disorder: Secondary | ICD-10-CM

## 2018-10-15 DIAGNOSIS — E782 Mixed hyperlipidemia: Secondary | ICD-10-CM | POA: Diagnosis not present

## 2018-10-15 DIAGNOSIS — M7552 Bursitis of left shoulder: Secondary | ICD-10-CM

## 2018-10-15 LAB — URINALYSIS, ROUTINE W REFLEX MICROSCOPIC
BILIRUBIN UA: NEGATIVE
GLUCOSE, UA: NEGATIVE
Ketones, UA: NEGATIVE
Leukocytes, UA: NEGATIVE
Nitrite, UA: NEGATIVE
PH UA: 5.5 (ref 5.0–7.5)
PROTEIN UA: NEGATIVE
RBC, UA: NEGATIVE
Specific Gravity, UA: 1.02 (ref 1.005–1.030)
Urobilinogen, Ur: 0.2 mg/dL (ref 0.2–1.0)

## 2018-10-15 MED ORDER — EZETIMIBE 10 MG PO TABS: 10.0000 mg | ORAL_TABLET | Freq: Every day | ORAL | 4 refills | Status: DC

## 2018-10-15 MED ORDER — AMLODIPINE BESYLATE 10 MG PO TABS: 10.0000 mg | ORAL_TABLET | Freq: Every day | ORAL | 4 refills | Status: DC

## 2018-10-15 MED ORDER — ATORVASTATIN CALCIUM 40 MG PO TABS: 40.0000 mg | ORAL_TABLET | Freq: Every day | ORAL | 4 refills | Status: DC

## 2018-10-15 MED ORDER — CARVEDILOL 25 MG PO TABS: 25.0000 mg | ORAL_TABLET | Freq: Two times a day (BID) | ORAL | 4 refills | Status: DC

## 2018-10-15 MED ORDER — LISINOPRIL 40 MG PO TABS: 40.0000 mg | ORAL_TABLET | Freq: Every day | ORAL | 4 refills | Status: DC

## 2018-10-15 MED ORDER — MELOXICAM 7.5 MG PO TABS: 7.5000 mg | ORAL_TABLET | Freq: Every day | ORAL | 4 refills | Status: DC

## 2018-10-15 NOTE — Assessment & Plan Note (Signed)
A voluntary discussion about advanced care planning including explanation and discussion of advanced directives was extentively discussed with the patient.  Explained about the healthcare proxy and living will was reviewed and packet with forms with expiration of how to fill them out was given.  Time spent: Encounter 16+ min individuals present: Patient 

## 2018-10-15 NOTE — Patient Instructions (Signed)
Pneumococcal Conjugate Vaccine (PCV13): What You Need to Know  1. Why get vaccinated?  Vaccination can protect both children and adults from pneumococcal disease.  Pneumococcal disease is caused by bacteria that can spread from person to person through close contact. It can cause ear infections, and it can also lead to more serious infections of the:  · Lungs (pneumonia),  · Blood (bacteremia), and  · Covering of the brain and spinal cord (meningitis).  Pneumococcal pneumonia is most common among adults. Pneumococcal meningitis can cause deafness and brain damage, and it kills about 1 child in 10 who get it.  Anyone can get pneumococcal disease, but children under 2 years of age and adults 65 years and older, people with certain medical conditions, and cigarette smokers are at the highest risk.  Before there was a vaccine, the [place] saw:  · more than 700 cases of meningitis,  · about 13,000 blood infections,  · about 5 million ear infections, and  · about 200 deaths  in children under 5 each year from pneumococcal disease. Since vaccine became available, severe pneumococcal disease in these children has fallen by 88%.  About 18,000 older adults die of pneumococcal disease each year in the [place].  Treatment of pneumococcal infections with penicillin and other drugs is not as effective as it used to be, because some strains of the disease have become resistant to these drugs. This makes prevention of the disease, through vaccination, even more important.  2. PCV13 vaccine  Pneumococcal conjugate vaccine (called PCV13) protects against 13 types of pneumococcal bacteria.  PCV13 is routinely given to children at 2, 4, 6, and 12-15 months of age. It is also recommended for children and adults 2 to 64 years of age with certain health conditions, and for all adults 65 years of age and older. Your doctor can give you details.  3. Some people should not get this vaccine  Anyone who has ever had a  life-threatening allergic reaction to a dose of this vaccine, to an earlier pneumococcal vaccine called PCV7, or to any vaccine containing diphtheria toxoid (for example, DTaP), should not get PCV13.  Anyone with a severe allergy to any component of PCV13 should not get the vaccine. Tell your doctor if the person being vaccinated has any severe allergies.  If the person scheduled for vaccination is not feeling well, your healthcare provider might decide to reschedule the shot on another day.  4. Risks of a vaccine reaction  With any medicine, including vaccines, there is a chance of reactions. These are usually mild and go away on their own, but serious reactions are also possible.  Problems reported following PCV13 varied by age and dose in the series. The most common problems reported among children were:  · About half became drowsy after the shot, had a temporary loss of appetite, or had redness or tenderness where the shot was given.  · About 1 out of 3 had swelling where the shot was given.  · About 1 out of 3 had a mild fever, and about 1 in 20 had a fever over 102.2°F.  · Up to about 8 out of 10 became fussy or irritable.  Adults have reported pain, redness, and swelling where the shot was given; also mild fever, fatigue, headache, chills, or muscle pain.  Young children who get PCV13 along with inactivated flu vaccine at the same time may be at increased risk for seizures caused by fever. Ask your doctor for more   information.  Problems that could happen after any vaccine:  · People sometimes faint after a medical procedure, including vaccination. Sitting or lying down for about 15 minutes can help prevent fainting, and injuries caused by a fall. Tell your doctor if you feel dizzy, or have vision changes or ringing in the ears.  · Some older children and adults get severe pain in the shoulder and have difficulty moving the arm where a shot was given. This happens very rarely.  · Any medication can cause a  severe allergic reaction. Such reactions from a vaccine are very rare, estimated at about 1 in a million doses, and would happen within a few minutes to a few hours after the vaccination.  As with any medicine, there is a very small chance of a vaccine causing a serious injury or death.  The safety of vaccines is always being monitored. For more information, visit: www.cdc.gov/vaccinesafety/  5. What if there is a serious reaction?  What should I look for?  · Look for anything that concerns you, such as signs of a severe allergic reaction, very high fever, or unusual behavior.  Signs of a severe allergic reaction can include hives, swelling of the face and throat, difficulty breathing, a fast heartbeat, dizziness, and weakness--usually within a few minutes to a few hours after the vaccination.  What should I do?  · If you think it is a severe allergic reaction or other emergency that can't wait, call 9-1-1 or get the person to the nearest hospital. Otherwise, call your doctor.  Reactions should be reported to the Vaccine Adverse Event Reporting System (VAERS). Your doctor should file this report, or you can do it yourself through the VAERS web site at www.vaers.hhs.gov, or by calling 1-800-822-7967.  VAERS does not give medical advice.  6. The National Vaccine Injury Compensation Program  The National Vaccine Injury Compensation Program (VICP) is a federal program that was created to compensate people who may have been injured by certain vaccines.  Persons who believe they may have been injured by a vaccine can learn about the program and about filing a claim by calling 1-800-338-2382 or visiting the VICP website at www.hrsa.gov/vaccinecompensation. There is a time limit to file a claim for compensation.  7. How can I learn more?  · Ask your healthcare provider. He or she can give you the vaccine package insert or suggest other sources of information.  · Call your local or state health department.  · Contact the  Centers for Disease Control and Prevention (CDC):  ? Call 1-800-232-4636 (1-800-CDC-INFO) or  ? Visit CDC's website at www.cdc.gov/vaccines  Vaccine Information Statement PCV13 Vaccine (07/31/2014)  This information is not intended to replace advice given to you by your health care provider. Make sure you discuss any questions you have with your health care provider.  Document Released: 07/10/2006 Document Revised: 04/24/2018 Document Reviewed: 04/24/2018  Elsevier Interactive Patient Education © 2019 [employer]

## 2018-10-15 NOTE — Assessment & Plan Note (Signed)
The current medical regimen is effective;  continue present plan and medications.  

## 2018-10-15 NOTE — Assessment & Plan Note (Signed)
stable °

## 2018-10-15 NOTE — Progress Notes (Signed)
BP 140/72   Pulse 62   Temp 97.8 F (36.6 C) (Oral)   Ht 5' 10.47" (1.79 m)   Wt 238 lb (108 kg)   SpO2 97%   BMI 33.69 kg/m    Subjective:    Patient ID: Andre Gallegos, male    DOB: Aug 06, 1951, 68 y.o.   MRN: [id number]  HPI: Andre Gallegos is a 68 y.o. male Annual exam Chief Complaint  Patient presents with  . Follow-up    Yearly OV. Does not get physical per insurance  . 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 Nose lesion no further issues is gone.  Blood pressure is good.  No issues Also takes cholesterol medications without problems or issues. Patient also had decided at last visit to lose weight and is done so has lost up to 30 pounds.  Has been feeling better and doing well. Has lesion adjacent to his larynx on the left side which Andre be a little more prominent with weight loss not sure if it is changed in size or character.  Relevant past medical, surgical, family and social history reviewed and updated as indicated. Interim medical history since our last visit reviewed. Allergies and medications reviewed and updated.  Review of Systems  Constitutional: Negative.   HENT: Negative.   Eyes: Negative.   Respiratory: Negative.   Cardiovascular: Negative.   Gastrointestinal: Negative.   Endocrine: Negative.   Genitourinary: Negative.   Musculoskeletal: Negative.   Skin: Negative.   Allergic/Immunologic: Negative.   Neurological: Negative.   Hematological: Negative.   Psychiatric/Behavioral: Negative.     Per HPI unless specifically indicated above     Objective:    BP 140/72   Pulse 62   Temp 97.8 F (36.6 C) (Oral)   Ht 5' 10.47" (1.79 m)   Wt 238 lb (108 kg)   SpO2 97%   BMI 33.69 kg/m   Wt Readings from Last 3 Encounters:  10/15/18 238 lb (108 kg)  04/05/18 260 lb (117.9 kg)  10/05/17 256 lb (116.1 kg)    Physical Exam Constitutional:       Appearance: He is well-developed.  HENT:     Head: Normocephalic and atraumatic.     Right Ear: External ear normal.     Left Ear: External ear normal.  Eyes:     Conjunctiva/sclera: Conjunctivae normal.     Pupils: Pupils are equal, round, and reactive to light.  Neck:     Musculoskeletal: Normal range of motion and neck supple.  Cardiovascular:     Rate and Rhythm: Normal rate and regular rhythm.     Heart sounds: Normal heart sounds.  Pulmonary:     Effort: Pulmonary effort is normal.     Breath sounds: Normal breath sounds.  Abdominal:     General: Bowel sounds are normal.     Palpations: Abdomen is soft. There is no hepatomegaly or splenomegaly.  Genitourinary:    Penis: Normal.      Prostate: Normal.     Rectum: Normal.  Musculoskeletal: Normal range of motion.  Skin:    Findings: No erythema or rash.  Neurological:     Mental Status: He is alert and oriented to person, place, and time.     Deep Tendon Reflexes: Reflexes are normal and symmetric.  Psychiatric:        Behavior: Behavior normal.  Thought Content: Thought content normal.        Judgment: Judgment normal.     Results for orders placed or performed in visit on 02/54/27  Basic metabolic panel  Result Value Ref Range   Glucose 108 (H) 65 - 99 mg/dL   BUN 15 8 - 27 mg/dL   Creatinine, Ser 1.44 (H) 0.76 - 1.27 mg/dL   GFR calc non Af Amer 50 (L) >59 mL/min/1.73   GFR calc Af Amer 58 (L) >59 mL/min/1.73   BUN/Creatinine Ratio 10 10 - 24   Sodium 142 134 - 144 mmol/L   Potassium 4.9 3.5 - 5.2 mmol/L   Chloride 106 96 - 106 mmol/L   CO2 23 20 - 29 mmol/L   Calcium 9.5 8.6 - 10.2 mg/dL  LP+ALT+AST Piccolo, Waived  Result Value Ref Range   ALT (SGPT) Piccolo, Waived 40 10 - 47 U/L   AST (SGOT) Piccolo, Waived 24 11 - 38 U/L   Cholesterol Piccolo, Waived 108 <200 mg/dL   HDL Chol Piccolo, Waived 30 (L) >59 mg/dL   Triglycerides Piccolo,Waived 85 <150 mg/dL   Chol/HDL Ratio Piccolo,Waive 3.6  mg/dL   LDL Chol Calc Piccolo Waived 60 <100 mg/dL   VLDL Chol Calc Piccolo,Waive 17 <30 mg/dL      Assessment & Plan:   Problem List Items Addressed This Visit      Cardiovascular and Mediastinum   Coronary artery disease   Relevant Medications   ezetimibe (ZETIA) 10 MG tablet   carvedilol (COREG) 25 MG tablet   atorvastatin (LIPITOR) 40 MG tablet   amLODipine (NORVASC) 10 MG tablet   lisinopril (PRINIVIL,ZESTRIL) 40 MG tablet   Benign essential hypertension - Primary    The current medical regimen is effective;  continue present plan and medications.       Relevant Medications   ezetimibe (ZETIA) 10 MG tablet   carvedilol (COREG) 25 MG tablet   atorvastatin (LIPITOR) 40 MG tablet   amLODipine (NORVASC) 10 MG tablet   lisinopril (PRINIVIL,ZESTRIL) 40 MG tablet   Other Relevant Orders   CBC with Differential/Platelet   Comprehensive metabolic panel   Lipid panel   Urinalysis, Routine w reflex microscopic     Genitourinary   BPH (benign prostatic hyperplasia)    stable      Relevant Orders   PSA     Other   Hyperlipemia, mixed    The current medical regimen is effective;  continue present plan and medications.        Relevant Medications   ezetimibe (ZETIA) 10 MG tablet   carvedilol (COREG) 25 MG tablet   atorvastatin (LIPITOR) 40 MG tablet   amLODipine (NORVASC) 10 MG tablet   lisinopril (PRINIVIL,ZESTRIL) 40 MG tablet   Other Relevant Orders   CBC with Differential/Platelet   Comprehensive metabolic panel   Lipid panel   Urinalysis, Routine w reflex microscopic   Advanced care planning/counseling discussion    A voluntary discussion about advanced care planning including explanation and discussion of advanced directives was extentively discussed with the patient.  Explained about the healthcare proxy and living will was reviewed and packet with forms with expiration of how to fill them out was given.  Time spent: Encounter 16+ min individuals present:  Patient       Other Visit Diagnoses    Essential hypertension       Relevant Medications   ezetimibe (ZETIA) 10 MG tablet   carvedilol (COREG) 25 MG tablet   atorvastatin (LIPITOR) 40  MG tablet   amLODipine (NORVASC) 10 MG tablet   lisinopril (PRINIVIL,ZESTRIL) 40 MG tablet   Other Relevant Orders   CBC with Differential/Platelet   Comprehensive metabolic panel   Lipid panel   Urinalysis, Routine w reflex microscopic   Pure hypercholesterolemia       Relevant Medications   ezetimibe (ZETIA) 10 MG tablet   carvedilol (COREG) 25 MG tablet   atorvastatin (LIPITOR) 40 MG tablet   amLODipine (NORVASC) 10 MG tablet   lisinopril (PRINIVIL,ZESTRIL) 40 MG tablet   Other Relevant Orders   CBC with Differential/Platelet   Comprehensive metabolic panel   Lipid panel   Urinalysis, Routine w reflex microscopic   Thyroid disorder screen       Relevant Orders   TSH   Need for vaccination with 13-polyvalent pneumococcal conjugate vaccine       Relevant Orders   Pneumococcal conjugate vaccine 13-valent (Completed)   Bursitis of left shoulder       Relevant Medications   meloxicam (MOBIC) 7.5 MG tablet       Follow up plan: Return in about 6 months (around 04/15/2019) for BMP,  Lipids, ALT, AST.

## 2018-10-16 ENCOUNTER — Encounter: Payer: Self-pay | Admitting: Family Medicine

## 2018-10-16 LAB — LIPID PANEL
Chol/HDL Ratio: 2.9 ratio (ref 0.0–5.0)
Cholesterol, Total: 102 mg/dL (ref 100–199)
HDL: 35 mg/dL — AB (ref >39)
LDL Calculated: 55 mg/dL (ref 0–99)
Triglycerides: 60 mg/dL (ref 0–149)
VLDL CHOLESTEROL CAL: 12 mg/dL (ref 5–40)

## 2018-10-16 LAB — CBC WITH DIFFERENTIAL/PLATELET
BASOS ABS: 0 10*3/uL (ref 0.0–0.2)
Basos: 1 %
EOS (ABSOLUTE): 0.3 10*3/uL (ref 0.0–0.4)
Eos: 4 %
Hematocrit: 43.1 % (ref 37.5–51.0)
Hemoglobin: 14.4 g/dL (ref 13.0–17.7)
IMMATURE GRANS (ABS): 0 10*3/uL (ref 0.0–0.1)
IMMATURE GRANULOCYTES: 1 %
LYMPHS: 22 %
Lymphocytes Absolute: 1.4 10*3/uL (ref 0.7–3.1)
MCH: 31.2 pg (ref 26.6–33.0)
MCHC: 33.4 g/dL (ref 31.5–35.7)
MCV: 93 fL (ref 79–97)
MONOCYTES: 7 %
Monocytes Absolute: 0.4 10*3/uL (ref 0.1–0.9)
NEUTROS PCT: 65 %
Neutrophils Absolute: 4.2 10*3/uL (ref 1.4–7.0)
PLATELETS: 150 10*3/uL (ref 150–450)
RBC: 4.62 x10E6/uL (ref 4.14–5.80)
RDW: 12.3 % (ref 11.6–15.4)
WBC: 6.3 10*3/uL (ref 3.4–10.8)

## 2018-10-16 LAB — TSH: TSH: 2.7 u[IU]/mL (ref 0.450–4.500)

## 2018-10-16 LAB — COMPREHENSIVE METABOLIC PANEL
ALT: 36 IU/L (ref 0–44)
AST: 22 IU/L (ref 0–40)
Albumin/Globulin Ratio: 2.1 (ref 1.2–2.2)
Albumin: 4.2 g/dL (ref 3.8–4.8)
Alkaline Phosphatase: 66 IU/L (ref 39–117)
BILIRUBIN TOTAL: 0.5 mg/dL (ref 0.0–1.2)
BUN/Creatinine Ratio: 14 (ref 10–24)
BUN: 21 mg/dL (ref 8–27)
CALCIUM: 9.3 mg/dL (ref 8.6–10.2)
CHLORIDE: 108 mmol/L — AB (ref 96–106)
CO2: 20 mmol/L (ref 20–29)
CREATININE: 1.49 mg/dL — AB (ref 0.76–1.27)
GFR, EST AFRICAN AMERICAN: 55 mL/min/{1.73_m2} — AB (ref >59)
GFR, EST NON AFRICAN AMERICAN: 48 mL/min/{1.73_m2} — AB (ref >59)
GLUCOSE: 110 mg/dL — AB (ref 65–99)
Globulin, Total: 2 g/dL (ref 1.5–4.5)
Potassium: 4.8 mmol/L (ref 3.5–5.2)
Sodium: 143 mmol/L (ref 134–144)
TOTAL PROTEIN: 6.2 g/dL (ref 6.0–8.5)

## 2018-10-16 LAB — PSA: Prostate Specific Ag, Serum: 0.9 ng/mL (ref 0.0–4.0)

## 2018-10-31 ENCOUNTER — Ambulatory Visit (INDEPENDENT_AMBULATORY_CARE_PROVIDER_SITE_OTHER): Payer: Medicare Other

## 2018-10-31 VITALS — BP 136/72 | HR 64 | Temp 98.5°F | Resp 16 | Ht 70.0 in | Wt 239.7 lb

## 2018-10-31 DIAGNOSIS — Z0000 Encounter for general adult medical examination without abnormal findings: Secondary | ICD-10-CM | POA: Diagnosis not present

## 2018-10-31 NOTE — Patient Instructions (Addendum)
Andre Gallegos , Thank you for taking time to come for your Medicare Wellness Visit. I appreciate your ongoing commitment to your health goals. Please review the following plan we discussed and let me know if I can assist you in the future.   Screening recommendations/referrals: Colonoscopy: completed 11/01/2016 Recommended yearly ophthalmology/optometry visit for glaucoma screening and checkup Recommended yearly dental visit for hygiene and checkup  Vaccinations: Influenza vaccine: up to date Pneumococcal vaccine: up to date Tdap vaccine: up to date Shingles vaccine: shingrix eligible, check with your insurance company for coverage     Advanced directives: copy on file   Conditions/risks identified: continue the good work with your exercise routine.   Next appointment: Follow up in one year for your annual wellness exam.   Preventive Care 65 Years and Older, Male Preventive care refers to lifestyle choices and visits with your health care provider that can promote health and wellness. What does preventive care include?  A yearly physical exam. This is also called an annual well check.  Dental exams once or twice a year.  Routine eye exams. Ask your health care provider how often you should have your eyes checked.  Personal lifestyle choices, including:  Daily care of your teeth and gums.  Regular physical activity.  Eating a healthy diet.  Avoiding tobacco and drug use.  Limiting alcohol use.  Practicing safe sex.  Taking low doses of aspirin every day.  Taking vitamin and mineral supplements as recommended by your health care provider. What happens during an annual well check? The services and screenings done by your health care provider during your annual well check will depend on your age, overall health, lifestyle risk factors, and family history of disease. Counseling  Your health care provider may ask you questions about your:  Alcohol use.  Tobacco  use.  Drug use.  Emotional well-being.  Home and relationship well-being.  Sexual activity.  Eating habits.  History of falls.  Memory and ability to understand (cognition).  Work and work Statistician. Screening  You may have the following tests or measurements:  Height, weight, and BMI.  Blood pressure.  Lipid and cholesterol levels. These may be checked every 5 years, or more frequently if you are over 87 years old.  Skin check.  Lung cancer screening. You may have this screening every year starting at age 56 if you have a 30-pack-year history of smoking and currently smoke or have quit within the past 15 years.  Fecal occult blood test (FOBT) of the stool. You may have this test every year starting at age 70.  Flexible sigmoidoscopy or colonoscopy. You may have a sigmoidoscopy every 5 years or a colonoscopy every 10 years starting at age 42.  Prostate cancer screening. Recommendations will vary depending on your family history and other risks.  Hepatitis C blood test.  Hepatitis B blood test.  Sexually transmitted disease (STD) testing.  Diabetes screening. This is done by checking your blood sugar (glucose) after you have not eaten for a while (fasting). You may have this done every 1-3 years.  Abdominal aortic aneurysm (AAA) screening. You may need this if you are a current or former smoker.  Osteoporosis. You may be screened starting at age 61 if you are at high risk. Talk with your health care provider about your test results, treatment options, and if necessary, the need for more tests. Vaccines  Your health care provider may recommend certain vaccines, such as:  Influenza vaccine. This is recommended every  year.  Tetanus, diphtheria, and acellular pertussis (Tdap, Td) vaccine. You may need a Td booster every 10 years.  Zoster vaccine. You may need this after age 61.  Pneumococcal 13-valent conjugate (PCV13) vaccine. One dose is recommended after age  11.  Pneumococcal polysaccharide (PPSV23) vaccine. One dose is recommended after age 51. Talk to your health care provider about which screenings and vaccines you need and how often you need them. This information is not intended to replace advice given to you by your health care provider. Make sure you discuss any questions you have with your health care provider. Document Released: 10/09/2015 Document Revised: 06/01/2016 Document Reviewed: 07/14/2015 Elsevier Interactive Patient Education  2017 [place] Prevention in the Home Falls can cause injuries. They can happen to people of all ages. There are many things you can do to make your home safe and to help prevent falls. What can I do on the outside of my home?  Regularly fix the edges of walkways and driveways and fix any cracks.  Remove anything that might make you trip as you walk through a door, such as a raised step or threshold.  Trim any bushes or trees on the path to your home.  Use bright outdoor lighting.  Clear any walking paths of anything that might make someone trip, such as rocks or tools.  Regularly check to see if handrails are loose or broken. Make sure that both sides of any steps have handrails.  Any raised decks and porches should have guardrails on the edges.  Have any leaves, snow, or ice cleared regularly.  Use sand or salt on walking paths during winter.  Clean up any spills in your garage right away. This includes oil or grease spills. What can I do in the bathroom?  Use night lights.  Install grab bars by the toilet and in the tub and shower. Do not use towel bars as grab bars.  Use non-skid mats or decals in the tub or shower.  If you need to sit down in the shower, use a plastic, non-slip stool.  Keep the floor dry. Clean up any water that spills on the floor as soon as it happens.  Remove soap buildup in the tub or shower regularly.  Attach bath mats securely with double-sided  non-slip rug tape.  Do not have throw rugs and other things on the floor that can make you trip. What can I do in the bedroom?  Use night lights.  Make sure that you have a light by your bed that is easy to reach.  Do not use any sheets or blankets that are too big for your bed. They should not hang down onto the floor.  Have a firm chair that has side arms. You can use this for support while you get dressed.  Do not have throw rugs and other things on the floor that can make you trip. What can I do in the kitchen?  Clean up any spills right away.  Avoid walking on wet floors.  Keep items that you use a lot in easy-to-reach places.  If you need to reach something above you, use a strong step stool that has a grab bar.  Keep electrical cords out of the way.  Do not use floor polish or wax that makes floors slippery. If you must use wax, use non-skid floor wax.  Do not have throw rugs and other things on the floor that can make you trip. What can  I do with my stairs?  Do not leave any items on the stairs.  Make sure that there are handrails on both sides of the stairs and use them. Fix handrails that are broken or loose. Make sure that handrails are as long as the stairways.  Check any carpeting to make sure that it is firmly attached to the stairs. Fix any carpet that is loose or worn.  Avoid having throw rugs at the top or bottom of the stairs. If you do have throw rugs, attach them to the floor with carpet tape.  Make sure that you have a light switch at the top of the stairs and the bottom of the stairs. If you do not have them, ask someone to add them for you. What else can I do to help prevent falls?  Wear shoes that:  Do not have high heels.  Have rubber bottoms.  Are comfortable and fit you well.  Are closed at the toe. Do not wear sandals.  If you use a stepladder:  Make sure that it is fully opened. Do not climb a closed stepladder.  Make sure that both  sides of the stepladder are locked into place.  Ask someone to hold it for you, if possible.  Clearly mark and make sure that you can see:  Any grab bars or handrails.  First and last steps.  Where the edge of each step is.  Use tools that help you move around (mobility aids) if they are needed. These include:  Canes.  Walkers.  Scooters.  Crutches.  Turn on the lights when you go into a dark area. Replace any light bulbs as soon as they burn out.  Set up your furniture so you have a clear path. Avoid moving your furniture around.  If any of your floors are uneven, fix them.  If there are any pets around you, be aware of where they are.  Review your medicines with your doctor. Some medicines can make you feel dizzy. This can increase your chance of falling. Ask your doctor what other things that you can do to help prevent falls. This information is not intended to replace advice given to you by your health care provider. Make sure you discuss any questions you have with your health care provider. Document Released: 07/09/2009 Document Revised: 02/18/2016 Document Reviewed: 10/17/2014 Elsevier Interactive Patient Education  2017 [employer].

## 2018-10-31 NOTE — Progress Notes (Signed)
Subjective:   Andre Gallegos is a 68 y.o. male who presents for Medicare Annual/Subsequent preventive examination.  Review of Systems:  Cardiac Risk Factors include: advanced age (>50men, >37 women);hypertension;dyslipidemia;male gender;obesity (BMI >30kg/m2)       Objective:    Vitals: BP 136/72 (BP Location: Left Arm, Patient Position: Sitting, Cuff Size: Normal)   Pulse 64   Temp 98.5 F (36.9 C) (Temporal)   Resp 16   Ht 5\' 10"  (1.778 m)   Wt 239 lb 11.2 oz (108.7 kg)   BMI 34.39 kg/m   Body mass index is 34.39 kg/m.  Advanced Directives 10/31/2018 09/22/2017 11/01/2016  Does Patient Have a Medical Advance Directive? Yes No No  Type of Advance Directive Living will;Healthcare Power of Attorney - -  Does patient want to make changes to medical advance directive? - Yes (MAU/Ambulatory/Procedural Areas - Information given) -  Copy of [place] in Chart? Yes - validated most recent copy scanned in chart (See row information) - -  Would patient like information on creating a medical advance directive? - - No - Patient declined    Tobacco Social History   Tobacco Use  Smoking Status Former Smoker  . Types: Cigarettes  . Last attempt to quit: 1990  . Years since quitting: 30.1  Smokeless Tobacco Former Systems developer  . Types: Snuff  Tobacco Comment   quit 30 years ago      Counseling given: Not Answered Comment: quit 30 years ago    Clinical Intake:  Pre-visit preparation completed: Yes  Pain : No/denies pain Pain Score: 0-No pain     Nutritional Status: BMI > 30  Obese Nutritional Risks: None Diabetes: No  How often do you need to have someone help you when you read instructions, pamphlets, or other written materials from your doctor or pharmacy?: 1 - Never What is the last grade level you completed in school?: some college   Interpreter Needed?: No  Information entered by ::  ,LPN  Past Medical History:  Diagnosis Date  .  Arthritis   . CAD (coronary artery disease)   . Hyperlipidemia   . Myocardial infarction [facility]) 2005   Past Surgical History:  Procedure Laterality Date  . APPENDECTOMY    . CARDIAC CATHETERIZATION    . COLONOSCOPY WITH PROPOFOL N/A 11/01/2016   Procedure: COLONOSCOPY WITH PROPOFOL;  Surgeon: Andre Bellows, MD;  Location: [facility] ENDOSCOPY;  Service: Endoscopy;  Laterality: N/A;  . CORONARY ANGIOPLASTY     Family History  Problem Relation Age of Onset  . Lung cancer Father    Social History   Socioeconomic History  . Marital status: Married    Spouse name: Not on file  . Number of children: Not on file  . Years of education: Not on file  . Highest education level: Some college, no degree  Occupational History  . Occupation: retired  Scientific laboratory technician  . Financial resource strain: Not very hard  . Food insecurity:    Worry: Never true    Inability: Never true  . Transportation needs:    Medical: No    Non-medical: No  Tobacco Use  . Smoking status: Former Smoker    Types: Cigarettes    Last attempt to quit: 1990    Years since quitting: 30.1  . Smokeless tobacco: Former Systems developer    Types: Snuff  . Tobacco comment: quit 30 years ago   Substance and Sexual Activity  . Alcohol use: Yes    Comment: occasionally   .  Drug use: No  . Sexual activity: Not on file  Lifestyle  . Physical activity:    Days per week: 0 days    Minutes per session: 0 min  . Stress: Not at all  Relationships  . Social connections:    Talks on phone: More than three times a week    Gets together: Twice a week    Attends religious service: More than 4 times per year    Active member of club or organization: No    Attends meetings of clubs or organizations: Never    Relationship status: Married  Other Topics Concern  . Not on file  Social History Narrative  . Not on file    Outpatient Encounter Medications as of 10/31/2018  Medication Sig  . amLODipine (NORVASC) 10 MG tablet Take 1 tablet (10 mg total)  by mouth daily.  Marland Kitchen aspirin (ASPIRIN EC) 81 MG EC tablet Take 81 mg by mouth daily. Swallow whole.  Marland Kitchen atorvastatin (LIPITOR) 40 MG tablet Take 1 tablet (40 mg total) by mouth daily at 6 PM.  . carvedilol (COREG) 25 MG tablet Take 1 tablet (25 mg total) by mouth 2 (two) times daily with a meal.  . ezetimibe (ZETIA) 10 MG tablet Take 1 tablet (10 mg total) by mouth daily.  . Lactobacillus (PROBIOTIC ACIDOPHILUS PO) Take 1 capsule by mouth.  Marland Kitchen lisinopril (PRINIVIL,ZESTRIL) 40 MG tablet Take 1 tablet (40 mg total) by mouth daily.  . meloxicam (MOBIC) 7.5 MG tablet Take 1 tablet (7.5 mg total) by mouth daily.   No facility-administered encounter medications on file as of 10/31/2018.     Activities of Daily Living In your present state of health, do you have any difficulty performing the following activities: 10/31/2018 10/15/2018  Hearing? 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 N N  Difficulty concentrating or making decisions? N N  Walking or climbing stairs? N N  Dressing or bathing? N N  Doing errands, shopping? N N  Preparing Food and eating ? N -  Using the Toilet? N -  In the past six months, have you accidently leaked urine? N -  Do you have problems with loss of bowel control? N -  Managing your Medications? N -  Managing your Finances? N -  Housekeeping or managing your Housekeeping? N -  Some recent data might be hidden    Patient Care Team: Guadalupe Maple, MD as PCP - General (Family Medicine)   Assessment:   This is a routine wellness examination for Andre Gallegos.  Exercise Activities and Dietary recommendations Current Exercise Habits: Home exercise routine, Type of exercise: treadmill([employer] walking 1-2 miles ), Time (Minutes): 60, Frequency (Times/Week): 2, Weekly Exercise (Minutes/Week): 120, Intensity: Mild, Exercise limited by: None identified  Goals    . DIET - INCREASE WATER INTAKE     Recommend drinking at least  6-8 glasses of water a day        Fall Risk Fall Risk  10/31/2018 10/15/2018 04/05/2018 10/05/2017 09/22/2017  Falls in the past year? 0 0 No No No  Number falls in past yr: 0 - - - -  Follow up - Falls evaluation completed - - -  FALL RISK PREVENTION PERTAINING TO THE HOME:  Any stairs in or around the home WITH handrails? Yes  stairs have handrails  Home free of loose throw rugs in walkways, pet beds, electrical cords, etc? Yes  Adequate lighting in your home to reduce risk of falls? Yes   ASSISTIVE DEVICES UTILIZED TO PREVENT FALLS:  Life alert? No  Use of a cane, walker or w/c? No  Grab bars in the bathroom? No  Shower chair or bench in shower? No  Elevated toilet seat or a handicapped toilet? No   DME ORDERS:  DME order needed?  No   TIMED UP AND GO:  Was the test performed? No .  Length of time to ambulate 10 feet: 12 sec.   GAIT:  Appearance of gait: Gait stead-fast without the use of an assistive device.  Education: Fall risk prevention has been discussed.  Intervention(s) required? No   Depression Screen PHQ 2/9 Scores 10/31/2018 10/15/2018 04/05/2018 10/05/2017  PHQ - 2 Score 0 0 0 0  PHQ- 9 Score - 0 - -    Cognitive Function     6CIT Screen 10/31/2018 09/22/2017  What Year? 0 points 0 points  What month? 0 points 0 points  What time? 0 points 0 points  Count back from 20 0 points 0 points  Months in reverse 0 points 0 points  Repeat phrase 0 points 0 points  Total Score 0 0    Immunization History  Administered Date(s) Administered  . Influenza, High Dose Seasonal PF 09/07/2016, 09/22/2017  . Influenza,inj,Quad PF,6+ Mos 08/31/2015  . Influenza-Unspecified 07/03/2018  . Pneumococcal Conjugate-13 10/15/2018  . Pneumococcal Polysaccharide-23 04/04/2017  . Pneumococcal-Unspecified 09/26/2005  . Tdap 08/26/2014  . Zoster 08/21/2013    Qualifies for Shingles Vaccine? Yes  Zostavax completed 08/21/2013. Due for Shingrix. Education has been provided  regarding the importance of this vaccine. Pt has been advised to call insurance company to determine out of pocket expense. Advised may also receive vaccine at local pharmacy or Health Dept. Verbalized acceptance and understanding.  Tdap: up to date  Flu Vaccine: up to date   Pneumococcal Vaccine: up to date   Screening Tests Health Maintenance  Topic Date Due  . COLONOSCOPY  11/01/2021  . TETANUS/TDAP  08/26/2024  . INFLUENZA VACCINE  Completed  . Hepatitis C Screening  Completed  . PNA vac Low Risk Adult  Completed   Cancer Screenings:  Colorectal Screening: Completed 11/01/2016. Repeat every 5 years;    Lung Cancer Screening: (Low Dose CT Chest recommended if Age 73-80 years, 30 pack-year currently smoking OR have quit w/in 15years.) does not qualify.   Lung Cancer Screening Referral: An Epic message has been sent to Burgess Estelle, RN (Oncology Nurse Navigator) regarding the possible need for this exam. Raquel Sarna will review the patient's chart to determine if the patient truly qualifies for the exam. If the patient qualifies, Raquel Sarna will order the Low Dose CT of the chest to facilitate the scheduling of this exam.  Additional Screening:  Hepatitis C Screening: does qualify; Completed 08/31/2015  Vision Screening: Recommended annual ophthalmology exams for early detection of glaucoma and other disorders of the eye. Is the patient up to date with their annual eye exam?  Yes  Who is the provider or what is the name of the office in which the pt attends annual eye exams? [facility]  If pt is not established with a provider, would they like to be referred to a provider to establish care? No . Ophthalmology referral has been placed. Pt aware the office will call re: appt.  Dental Screening: Recommended annual dental exams for proper oral hygiene  Community Resource Referral:  CRR required this visit?  No  Plan:    I have personally reviewed and addressed the Medicare Annual Wellness  questionnaire and have noted the following in the patient's chart:  A. Medical and social history B. Use of alcohol, tobacco or illicit drugs  C. Current medications and supplements D. Functional ability and status E.  Nutritional status F.  Physical activity G. Advance directives H. List of other physicians I.  Hospitalizations, surgeries, and ER visits in previous 12 months J.  [place] such as hearing and vision if needed, cognitive and depression L. Referrals and appointments   In addition, I have reviewed and discussed with patient certain preventive protocols, quality metrics, and best practice recommendations. A written personalized care plan for preventive services as well as general preventive health recommendations were provided to patient.   Signed,  Tyler Aas, LPN Nurse Health Advisor   Nurse Notes:none

## 2019-04-15 ENCOUNTER — Ambulatory Visit (INDEPENDENT_AMBULATORY_CARE_PROVIDER_SITE_OTHER): Payer: Medicare Other | Admitting: Family Medicine

## 2019-04-15 ENCOUNTER — Telehealth: Payer: Self-pay | Admitting: Family Medicine

## 2019-04-15 ENCOUNTER — Encounter: Payer: Self-pay | Admitting: Family Medicine

## 2019-04-15 ENCOUNTER — Other Ambulatory Visit: Payer: Self-pay

## 2019-04-15 DIAGNOSIS — I10 Essential (primary) hypertension: Secondary | ICD-10-CM | POA: Diagnosis not present

## 2019-04-15 DIAGNOSIS — E782 Mixed hyperlipidemia: Secondary | ICD-10-CM | POA: Diagnosis not present

## 2019-04-15 DIAGNOSIS — I214 Non-ST elevation (NSTEMI) myocardial infarction: Secondary | ICD-10-CM | POA: Diagnosis not present

## 2019-04-15 NOTE — Assessment & Plan Note (Signed)
The current medical regimen is effective;  continue present plan and medications.  

## 2019-04-15 NOTE — Progress Notes (Signed)
There were no vitals taken for this visit.   Subjective:    Patient ID: Andre Gallegos, male    DOB: 1951-09-02, 68 y.o.   MRN: [id number]  HPI: Andre Gallegos is a 68 y.o. male  Med check  Follow-up patient doing well no complaints followed with with good reports and stable no blood work drawn. Blood pressure weight doing well has only gained 5 pounds back from weight loss prior to COVID-19. Cholesterol doing well with no complaints.  Relevant past medical, surgical, family and social history reviewed and updated as indicated. Interim medical history since our last visit reviewed. Allergies and medications reviewed and updated.  Review of Systems  Constitutional: Negative.   Respiratory: Negative.   Cardiovascular: Negative.     Per HPI unless specifically indicated above     Objective:    There were no vitals taken for this visit.  Wt Readings from Last 3 Encounters:  10/31/18 239 lb 11.2 oz (108.7 kg)  10/15/18 238 lb (108 kg)  04/05/18 260 lb (117.9 kg)    Physical Exam  Results for orders placed or performed in visit on 10/15/18  CBC with Differential/Platelet  Result Value Ref Range   WBC 6.3 3.4 - 10.8 x10E3/uL   RBC 4.62 4.14 - 5.80 x10E6/uL   Hemoglobin 14.4 13.0 - 17.7 g/dL   Hematocrit 43.1 37.5 - 51.0 %   MCV 93 79 - 97 fL   MCH 31.2 26.6 - 33.0 pg   MCHC 33.4 31.5 - 35.7 g/dL   RDW 12.3 11.6 - 15.4 %   Platelets 150 150 - 450 x10E3/uL   Neutrophils 65 Not Estab. %   Lymphs 22 Not Estab. %   Monocytes 7 Not Estab. %   Eos 4 Not Estab. %   Basos 1 Not Estab. %   Neutrophils Absolute 4.2 1.4 - 7.0 x10E3/uL   Lymphocytes Absolute 1.4 0.7 - 3.1 x10E3/uL   Monocytes Absolute 0.4 0.1 - 0.9 x10E3/uL   EOS (ABSOLUTE) 0.3 0.0 - 0.4 x10E3/uL   Basophils Absolute 0.0 0.0 - 0.2 x10E3/uL   Immature Granulocytes 1 Not Estab. %   Immature Grans (Abs) 0.0 0.0 - 0.1 x10E3/uL  Comprehensive metabolic panel  Result Value Ref Range   Glucose 110 (H) 65 -  99 mg/dL   BUN 21 8 - 27 mg/dL   Creatinine, Ser 1.49 (H) 0.76 - 1.27 mg/dL   GFR calc non Af Amer 48 (L) >59 mL/min/1.73   GFR calc Af Amer 55 (L) >59 mL/min/1.73   BUN/Creatinine Ratio 14 10 - 24   Sodium 143 134 - 144 mmol/L   Potassium 4.8 3.5 - 5.2 mmol/L   Chloride 108 (H) 96 - 106 mmol/L   CO2 20 20 - 29 mmol/L   Calcium 9.3 8.6 - 10.2 mg/dL   Total Protein 6.2 6.0 - 8.5 g/dL   Albumin 4.2 3.8 - 4.8 g/dL   Globulin, Total 2.0 1.5 - 4.5 g/dL   Albumin/Globulin Ratio 2.1 1.2 - 2.2   Bilirubin Total 0.5 0.0 - 1.2 mg/dL   Alkaline Phosphatase 66 39 - 117 IU/L   AST 22 0 - 40 IU/L   ALT 36 0 - 44 IU/L  Lipid panel  Result Value Ref Range   Cholesterol, Total 102 100 - 199 mg/dL   Triglycerides 60 0 - 149 mg/dL   HDL 35 (L) >39 mg/dL   VLDL Cholesterol Cal 12 5 - 40 mg/dL   LDL Calculated 55 0 -  99 mg/dL   Chol/HDL Ratio 2.9 0.0 - 5.0 ratio  TSH  Result Value Ref Range   TSH 2.700 0.450 - 4.500 uIU/mL  Urinalysis, Routine w reflex microscopic  Result Value Ref Range   Specific Gravity, UA 1.020 1.005 - 1.030   pH, UA 5.5 5.0 - 7.5   Color, UA Yellow Yellow   Appearance Ur Clear Clear   Leukocytes, UA Negative Negative   Protein, UA Negative Negative/Trace   Glucose, UA Negative Negative   Ketones, UA Negative Negative   RBC, UA Negative Negative   Bilirubin, UA Negative Negative   Urobilinogen, Ur 0.2 0.2 - 1.0 mg/dL   Nitrite, UA Negative Negative  PSA  Result Value Ref Range   Prostate Specific Ag, Serum 0.9 0.0 - 4.0 ng/mL      Assessment & Plan:   Problem List Items Addressed This Visit      Cardiovascular and Mediastinum   Benign essential hypertension    The current medical regimen is effective;  continue present plan and medications.       Relevant Orders   Basic metabolic panel   Non-ST elevation myocardial infarction (NSTEMI), subendocardial infarction, subsequent episode of care [facility]    The current medical regimen is effective;  continue  present plan and medications.         Other   Hyperlipemia, mixed    The current medical regimen is effective;  continue present plan and medications.       Relevant Orders   LP+ALT+AST [place], [employer]     Telemedicine using audio/video telecommunications for a synchronous communication visit. Today's visit due to COVID-19 isolation precautions I connected with and verified that I am speaking with the correct person using two identifiers.   I discussed the limitations, risks, security and privacy concerns of performing an evaluation and management service by telecommunication and the availability of in person appointments. I also discussed with the patient that there may be a patient responsible charge related to this service. The patient expressed understanding and agreed to proceed. The patient's location is home. I am at home.   I discussed the assessment and treatment plan with the patient. The patient was provided an opportunity to ask questions and all were answered. The patient agreed with the plan and demonstrated an understanding of the instructions.   The patient was advised to call back or seek an in-person evaluation if the symptoms worsen or if the condition fails to improve as anticipated.   I provided 21+ minutes of time during this encounter.   Follow up plan: Return in about 6 months (around 10/16/2019) for Physical Exam.

## 2019-04-15 NOTE — Telephone Encounter (Signed)
Patient is calling to schedule a lab appt. Please advise [phone]

## 2019-04-19 ENCOUNTER — Other Ambulatory Visit: Payer: Self-pay

## 2019-04-19 ENCOUNTER — Other Ambulatory Visit: Payer: Medicare Other

## 2019-04-19 DIAGNOSIS — I10 Essential (primary) hypertension: Secondary | ICD-10-CM

## 2019-04-19 DIAGNOSIS — E782 Mixed hyperlipidemia: Secondary | ICD-10-CM

## 2019-04-19 LAB — LP+ALT+AST PICCOLO, WAIVED
ALT (SGPT) Piccolo, Waived: 41 U/L (ref 10–47)
AST (SGOT) Piccolo, Waived: 29 U/L (ref 11–38)
Chol/HDL Ratio Piccolo,Waive: 3.4 mg/dL
Cholesterol Piccolo, Waived: 102 mg/dL (ref <200)
HDL Chol Piccolo, Waived: 30 mg/dL — ABNORMAL LOW (ref >59)
LDL Chol Calc Piccolo Waived: 59 mg/dL (ref <100)
Triglycerides Piccolo,Waived: 65 mg/dL (ref <150)
VLDL Chol Calc Piccolo,Waive: 13 mg/dL (ref <30)

## 2019-04-20 ENCOUNTER — Encounter: Payer: Self-pay | Admitting: Family Medicine

## 2019-04-20 LAB — BASIC METABOLIC PANEL
BUN/Creatinine Ratio: 16 (ref 10–24)
BUN: 22 mg/dL (ref 8–27)
CO2: 19 mmol/L — ABNORMAL LOW (ref 20–29)
Calcium: 9.3 mg/dL (ref 8.6–10.2)
Chloride: 108 mmol/L — ABNORMAL HIGH (ref 96–106)
Creatinine, Ser: 1.37 mg/dL — ABNORMAL HIGH (ref 0.76–1.27)
GFR calc Af Amer: 61 mL/min/{1.73_m2} (ref >59)
GFR calc non Af Amer: 53 mL/min/{1.73_m2} — ABNORMAL LOW (ref >59)
Glucose: 113 mg/dL — ABNORMAL HIGH (ref 65–99)
Potassium: 5.1 mmol/L (ref 3.5–5.2)
Sodium: 142 mmol/L (ref 134–144)

## 2019-06-01 ENCOUNTER — Inpatient Hospital Stay (HOSPITAL_COMMUNITY)
Admit: 2019-06-01 | Discharge: 2019-06-11 | DRG: 236 | Disposition: A | Discharge status: Home / Self Care | Payer: Medicare Other | Source: Other Acute Inpatient Hospital | Attending: Cardiothoracic Surgery | Admitting: Cardiothoracic Surgery

## 2019-06-01 ENCOUNTER — Emergency Department: Payer: Medicare Other

## 2019-06-01 ENCOUNTER — Inpatient Hospital Stay
Admission: EM | Admit: 2019-06-01 | Discharge: 2019-06-01 | DRG: 272 | Disposition: A | Discharge status: Other Acute Inpatient Hospital | Payer: Medicare Other | Attending: Interventional Cardiology | Admitting: Interventional Cardiology

## 2019-06-01 ENCOUNTER — Other Ambulatory Visit: Payer: Self-pay

## 2019-06-01 ENCOUNTER — Encounter: Payer: Self-pay | Admitting: Emergency Medicine

## 2019-06-01 ENCOUNTER — Encounter
Admission: EM | Disposition: A | Discharge status: Other Acute Inpatient Hospital | Payer: Self-pay | Source: Home / Self Care | Attending: Interventional Cardiology

## 2019-06-01 DIAGNOSIS — Z86010 Personal history of colonic polyps: Secondary | ICD-10-CM | POA: Diagnosis not present

## 2019-06-01 DIAGNOSIS — Z6829 Body mass index (BMI) 29.0-29.9, adult: Secondary | ICD-10-CM | POA: Diagnosis not present

## 2019-06-01 DIAGNOSIS — D696 Thrombocytopenia, unspecified: Secondary | ICD-10-CM | POA: Diagnosis not present

## 2019-06-01 DIAGNOSIS — I2111 ST elevation (STEMI) myocardial infarction involving right coronary artery: Secondary | ICD-10-CM

## 2019-06-01 DIAGNOSIS — Z87891 Personal history of nicotine dependence: Secondary | ICD-10-CM

## 2019-06-01 DIAGNOSIS — E875 Hyperkalemia: Secondary | ICD-10-CM

## 2019-06-01 DIAGNOSIS — N189 Chronic kidney disease, unspecified: Secondary | ICD-10-CM | POA: Diagnosis not present

## 2019-06-01 DIAGNOSIS — E8770 Fluid overload, unspecified: Secondary | ICD-10-CM | POA: Diagnosis not present

## 2019-06-01 DIAGNOSIS — R079 Chest pain, unspecified: Secondary | ICD-10-CM

## 2019-06-01 DIAGNOSIS — I213 ST elevation (STEMI) myocardial infarction of unspecified site: Secondary | ICD-10-CM

## 2019-06-01 DIAGNOSIS — I252 Old myocardial infarction: Secondary | ICD-10-CM

## 2019-06-01 DIAGNOSIS — I2510 Atherosclerotic heart disease of native coronary artery without angina pectoris: Secondary | ICD-10-CM | POA: Diagnosis present

## 2019-06-01 DIAGNOSIS — M1990 Unspecified osteoarthritis, unspecified site: Secondary | ICD-10-CM | POA: Diagnosis present

## 2019-06-01 DIAGNOSIS — J9811 Atelectasis: Secondary | ICD-10-CM | POA: Diagnosis not present

## 2019-06-01 DIAGNOSIS — I2119 ST elevation (STEMI) myocardial infarction involving other coronary artery of inferior wall: Secondary | ICD-10-CM | POA: Diagnosis present

## 2019-06-01 DIAGNOSIS — Z01810 Encounter for preprocedural cardiovascular examination: Secondary | ICD-10-CM | POA: Diagnosis not present

## 2019-06-01 DIAGNOSIS — N183 Chronic kidney disease, stage 3 (moderate): Secondary | ICD-10-CM | POA: Diagnosis present

## 2019-06-01 DIAGNOSIS — N179 Acute kidney failure, unspecified: Secondary | ICD-10-CM

## 2019-06-01 DIAGNOSIS — Z801 Family history of malignant neoplasm of trachea, bronchus and lung: Secondary | ICD-10-CM | POA: Diagnosis not present

## 2019-06-01 DIAGNOSIS — E782 Mixed hyperlipidemia: Secondary | ICD-10-CM | POA: Diagnosis present

## 2019-06-01 DIAGNOSIS — D62 Acute posthemorrhagic anemia: Secondary | ICD-10-CM | POA: Diagnosis not present

## 2019-06-01 DIAGNOSIS — Z7982 Long term (current) use of aspirin: Secondary | ICD-10-CM

## 2019-06-01 DIAGNOSIS — R0602 Shortness of breath: Secondary | ICD-10-CM

## 2019-06-01 DIAGNOSIS — I129 Hypertensive chronic kidney disease with stage 1 through stage 4 chronic kidney disease, or unspecified chronic kidney disease: Secondary | ICD-10-CM | POA: Diagnosis present

## 2019-06-01 DIAGNOSIS — Z9861 Coronary angioplasty status: Secondary | ICD-10-CM | POA: Diagnosis not present

## 2019-06-01 DIAGNOSIS — Z20828 Contact with and (suspected) exposure to other viral communicable diseases: Secondary | ICD-10-CM | POA: Diagnosis present

## 2019-06-01 DIAGNOSIS — J90 Pleural effusion, not elsewhere classified: Secondary | ICD-10-CM

## 2019-06-01 DIAGNOSIS — E669 Obesity, unspecified: Secondary | ICD-10-CM | POA: Diagnosis present

## 2019-06-01 DIAGNOSIS — I10 Essential (primary) hypertension: Secondary | ICD-10-CM | POA: Diagnosis not present

## 2019-06-01 DIAGNOSIS — I4891 Unspecified atrial fibrillation: Secondary | ICD-10-CM | POA: Diagnosis not present

## 2019-06-01 DIAGNOSIS — E785 Hyperlipidemia, unspecified: Secondary | ICD-10-CM | POA: Diagnosis present

## 2019-06-01 DIAGNOSIS — Z791 Long term (current) use of non-steroidal anti-inflammatories (NSAID): Secondary | ICD-10-CM | POA: Diagnosis not present

## 2019-06-01 DIAGNOSIS — K5900 Constipation, unspecified: Secondary | ICD-10-CM | POA: Diagnosis not present

## 2019-06-01 DIAGNOSIS — Z79899 Other long term (current) drug therapy: Secondary | ICD-10-CM | POA: Diagnosis not present

## 2019-06-01 DIAGNOSIS — J939 Pneumothorax, unspecified: Secondary | ICD-10-CM

## 2019-06-01 DIAGNOSIS — I25110 Atherosclerotic heart disease of native coronary artery with unstable angina pectoris: Secondary | ICD-10-CM | POA: Diagnosis present

## 2019-06-01 DIAGNOSIS — Z951 Presence of aortocoronary bypass graft: Secondary | ICD-10-CM | POA: Diagnosis not present

## 2019-06-01 DIAGNOSIS — I255 Ischemic cardiomyopathy: Secondary | ICD-10-CM | POA: Diagnosis present

## 2019-06-01 DIAGNOSIS — I313 Pericardial effusion (noninflammatory): Secondary | ICD-10-CM | POA: Diagnosis present

## 2019-06-01 DIAGNOSIS — Z09 Encounter for follow-up examination after completed treatment for conditions other than malignant neoplasm: Secondary | ICD-10-CM

## 2019-06-01 HISTORY — PX: LEFT HEART CATH AND CORONARY ANGIOGRAPHY: CATH118249

## 2019-06-01 HISTORY — PX: CORONARY/GRAFT ACUTE MI REVASCULARIZATION: CATH118305

## 2019-06-01 LAB — BASIC METABOLIC PANEL
Anion gap: 8 (ref 5–15)
Anion gap: 8 (ref 5–15)
BUN: 31 mg/dL — ABNORMAL HIGH (ref 8–23)
BUN: 23 mg/dL (ref 8–23)
CO2: 22 mmol/L (ref 22–32)
CO2: 19 mmol/L — ABNORMAL LOW (ref 22–32)
Calcium: 9.7 mg/dL (ref 8.9–10.3)
Calcium: 9.1 mg/dL (ref 8.9–10.3)
Chloride: 110 mmol/L (ref 98–111)
Chloride: 109 mmol/L (ref 98–111)
Creatinine, Ser: 1.87 mg/dL — ABNORMAL HIGH (ref 0.61–1.24)
Creatinine, Ser: 1.46 mg/dL — ABNORMAL HIGH (ref 0.61–1.24)
GFR calc Af Amer: 42 mL/min — ABNORMAL LOW (ref >60)
GFR calc Af Amer: 57 mL/min — ABNORMAL LOW (ref >60)
GFR calc non Af Amer: 36 mL/min — ABNORMAL LOW (ref >60)
GFR calc non Af Amer: 49 mL/min — ABNORMAL LOW (ref >60)
Glucose, Bld: 154 mg/dL — ABNORMAL HIGH (ref 70–99)
Glucose, Bld: 101 mg/dL — ABNORMAL HIGH (ref 70–99)
Potassium: 6.1 mmol/L — ABNORMAL HIGH (ref 3.5–5.1)
Potassium: 4.9 mmol/L (ref 3.5–5.1)
Sodium: 140 mmol/L (ref 135–145)
Sodium: 136 mmol/L (ref 135–145)

## 2019-06-01 LAB — CBC
HCT: 46.9 % (ref 39.0–52.0)
HCT: 42.5 % (ref 39.0–52.0)
Hemoglobin: 15.7 g/dL (ref 13.0–17.0)
Hemoglobin: 14.8 g/dL (ref 13.0–17.0)
MCH: 31.2 pg (ref 26.0–34.0)
MCH: 31.9 pg (ref 26.0–34.0)
MCHC: 33.5 g/dL (ref 30.0–36.0)
MCHC: 34.8 g/dL (ref 30.0–36.0)
MCV: 93.1 fL (ref 80.0–100.0)
MCV: 91.6 fL (ref 80.0–100.0)
Platelets: 224 10*3/uL (ref 150–400)
Platelets: 197 10*3/uL (ref 150–400)
RBC: 5.04 MIL/uL (ref 4.22–5.81)
RBC: 4.64 MIL/uL (ref 4.22–5.81)
RDW: 12.7 % (ref 11.5–15.5)
RDW: 12.7 % (ref 11.5–15.5)
WBC: 13 10*3/uL — ABNORMAL HIGH (ref 4.0–10.5)
WBC: 13.6 10*3/uL — ABNORMAL HIGH (ref 4.0–10.5)
nRBC: 0 % (ref 0.0–0.2)
nRBC: 0 % (ref 0.0–0.2)

## 2019-06-01 LAB — TSH: TSH: 2.314 u[IU]/mL (ref 0.350–4.500)

## 2019-06-01 LAB — HEPARIN LEVEL (UNFRACTIONATED): Heparin Unfractionated: <0.1 IU/mL — ABNORMAL LOW (ref 0.30–0.70)

## 2019-06-01 LAB — SARS CORONAVIRUS 2 BY RT PCR (HOSPITAL ORDER, PERFORMED IN ~~LOC~~ HOSPITAL LAB): SARS Coronavirus 2: NEGATIVE

## 2019-06-01 LAB — MAGNESIUM: Magnesium: 1.9 mg/dL (ref 1.7–2.4)

## 2019-06-01 LAB — PROTIME-INR
INR: 1 (ref 0.8–1.2)
Prothrombin Time: 12.9 seconds (ref 11.4–15.2)

## 2019-06-01 LAB — TROPONIN I (HIGH SENSITIVITY)
Troponin I (High Sensitivity): 8 ng/L (ref <18)
Troponin I (High Sensitivity): 2089 ng/L (ref <18)

## 2019-06-01 LAB — HEMOGLOBIN A1C
Hgb A1c MFr Bld: 5.5 % (ref 4.8–5.6)
Mean Plasma Glucose: 111.15 mg/dL

## 2019-06-01 LAB — MRSA PCR SCREENING: MRSA by PCR: NEGATIVE

## 2019-06-01 LAB — POCT ACTIVATED CLOTTING TIME: Activated Clotting Time: 379 seconds

## 2019-06-01 LAB — POTASSIUM: Potassium: 6.2 mmol/L — ABNORMAL HIGH (ref 3.5–5.1)

## 2019-06-01 LAB — BRAIN NATRIURETIC PEPTIDE: B Natriuretic Peptide: 66.6 pg/mL (ref 0.0–100.0)

## 2019-06-01 LAB — APTT: aPTT: 24 seconds (ref 24–36)

## 2019-06-01 SURGERY — CORONARY/GRAFT ACUTE MI REVASCULARIZATION
Anesthesia: Moderate Sedation

## 2019-06-01 MED ORDER — TIROFIBAN HCL IV 12.5 MG/250 ML
INTRAVENOUS | Status: AC | PRN
  Administered 2019-06-01: 0.075 ug/kg/min via INTRAVENOUS

## 2019-06-01 MED ORDER — ONDANSETRON HCL 4 MG/2ML IJ SOLN: 4.0000 mg | Freq: Four times a day (QID) | INTRAMUSCULAR | Status: DC | PRN

## 2019-06-01 MED ORDER — FENTANYL CITRATE (PF) 100 MCG/2ML IJ SOLN
INTRAMUSCULAR | Status: DC | PRN
  Administered 2019-06-01: 25 ug via INTRAVENOUS

## 2019-06-01 MED ORDER — NITROGLYCERIN IN D5W 200-5 MCG/ML-% IV SOLN
0.0000 ug/min | INTRAVENOUS | Status: DC
  Administered 2019-06-01: 10 ug/min via INTRAVENOUS
  Filled 2019-06-01: qty 250

## 2019-06-01 MED ORDER — ORAL CARE MOUTH RINSE
15.0000 mL | Freq: Two times a day (BID) | OROMUCOSAL | Status: DC
  Administered 2019-06-02 – 2019-06-04 (×4): 15 mL via OROMUCOSAL

## 2019-06-01 MED ORDER — TIROFIBAN HCL IV 12.5 MG/250 ML
0.1500 ug/kg/min | INTRAVENOUS | Status: AC
  Administered 2019-06-01 – 2019-06-02 (×2): 0.075 ug/kg/min via INTRAVENOUS
  Filled 2019-06-01 (×2): qty 250

## 2019-06-01 MED ORDER — NITROGLYCERIN 1 MG/10 ML FOR IR/CATH LAB
INTRA_ARTERIAL | Status: AC
  Filled 2019-06-01: qty 10

## 2019-06-01 MED ORDER — ASPIRIN EC 81 MG PO TBEC
81.0000 mg | DELAYED_RELEASE_TABLET | Freq: Every day | ORAL | Status: DC
  Administered 2019-06-02 – 2019-06-04 (×3): 81 mg via ORAL
  Filled 2019-06-01 (×3): qty 1

## 2019-06-01 MED ORDER — BIVALIRUDIN TRIFLUOROACETATE 250 MG IV SOLR
INTRAVENOUS | Status: AC
  Filled 2019-06-01: qty 250

## 2019-06-01 MED ORDER — SODIUM POLYSTYRENE SULFONATE 15 GM/60ML PO SUSP: 30.0000 g | Freq: Once | ORAL | Status: DC

## 2019-06-01 MED ORDER — HEPARIN (PORCINE) 25000 UT/250ML-% IV SOLN
1300.0000 [IU]/h | INTRAVENOUS | Status: DC
  Administered 2019-06-01: 18:00:00 1000 [IU]/h via INTRAVENOUS
  Administered 2019-06-02 – 2019-06-03 (×2): 1200 [IU]/h via INTRAVENOUS
  Administered 2019-06-04: 13:00:00 1300 [IU]/h via INTRAVENOUS
  Filled 2019-06-01 (×3): qty 250

## 2019-06-01 MED ORDER — ASPIRIN 81 MG PO CHEW
243.0000 mg | CHEWABLE_TABLET | Freq: Once | ORAL | Status: AC
  Administered 2019-06-01: 14:00:00 243 mg via ORAL

## 2019-06-01 MED ORDER — MIDAZOLAM HCL 2 MG/2ML IJ SOLN
INTRAMUSCULAR | Status: DC | PRN
  Administered 2019-06-01: 1 mg via INTRAVENOUS

## 2019-06-01 MED ORDER — CHLORHEXIDINE GLUCONATE CLOTH 2 % EX PADS
6.0000 | MEDICATED_PAD | Freq: Every day | CUTANEOUS | Status: DC
  Administered 2019-06-02 – 2019-06-03 (×2): 6 via TOPICAL

## 2019-06-01 MED ORDER — SODIUM CHLORIDE 0.9 % IV SOLN
INTRAVENOUS | Status: DC | PRN
  Administered 2019-06-01: 1.75 mg/kg/h via INTRAVENOUS

## 2019-06-01 MED ORDER — SODIUM CHLORIDE 0.9 % IV SOLN
INTRAVENOUS | Status: DC
  Administered 2019-06-01 – 2019-06-04 (×4): via INTRAVENOUS

## 2019-06-01 MED ORDER — HEPARIN (PORCINE) IN NACL 1000-0.9 UT/500ML-% IV SOLN
INTRAVENOUS | Status: AC
  Filled 2019-06-01: qty 1000

## 2019-06-01 MED ORDER — SODIUM CHLORIDE 0.9 % IV SOLN: 250.0000 mL | INTRAVENOUS | Status: DC | PRN

## 2019-06-01 MED ORDER — MIDAZOLAM HCL 2 MG/2ML IJ SOLN
INTRAMUSCULAR | Status: AC
  Filled 2019-06-01: qty 2

## 2019-06-01 MED ORDER — AMLODIPINE BESYLATE 5 MG PO TABS
5.0000 mg | ORAL_TABLET | Freq: Every day | ORAL | Status: DC
  Administered 2019-06-01 – 2019-06-04 (×3): 5 mg via ORAL
  Filled 2019-06-01 (×3): qty 1

## 2019-06-01 MED ORDER — HEPARIN (PORCINE) 25000 UT/250ML-% IV SOLN
INTRAVENOUS | Status: AC
  Filled 2019-06-01: qty 250

## 2019-06-01 MED ORDER — LABETALOL HCL 5 MG/ML IV SOLN: 10.0000 mg | INTRAVENOUS | Status: AC | PRN

## 2019-06-01 MED ORDER — ATORVASTATIN CALCIUM 80 MG PO TABS
80.0000 mg | ORAL_TABLET | Freq: Every day | ORAL | Status: DC
  Administered 2019-06-01 – 2019-06-10 (×9): 80 mg via ORAL
  Filled 2019-06-01 (×7): qty 1
  Filled 2019-06-01: qty 2
  Filled 2019-06-01: qty 1

## 2019-06-01 MED ORDER — IOHEXOL 300 MG/ML  SOLN
INTRAMUSCULAR | Status: DC | PRN
  Administered 2019-06-01: 240 mL

## 2019-06-01 MED ORDER — SODIUM CHLORIDE 0.9% FLUSH: 3.0000 mL | INTRAVENOUS | Status: DC | PRN

## 2019-06-01 MED ORDER — BIVALIRUDIN BOLUS VIA INFUSION - CUPID
INTRAVENOUS | Status: DC | PRN
  Administered 2019-06-01: 79.95 mg via INTRAVENOUS

## 2019-06-01 MED ORDER — SODIUM CHLORIDE 0.9% FLUSH: 3.0000 mL | Freq: Two times a day (BID) | INTRAVENOUS | Status: DC

## 2019-06-01 MED ORDER — ALUM & MAG HYDROXIDE-SIMETH 200-200-20 MG/5ML PO SUSP
30.0000 mL | ORAL | Status: DC | PRN
  Administered 2019-06-01: 30 mL via ORAL
  Filled 2019-06-01: qty 30

## 2019-06-01 MED ORDER — HEPARIN (PORCINE) IN NACL 1000-0.9 UT/500ML-% IV SOLN
INTRAVENOUS | Status: DC | PRN
  Administered 2019-06-01: 500 mL

## 2019-06-01 MED ORDER — SODIUM CHLORIDE 0.9 % IV BOLUS
500.0000 mL | Freq: Once | INTRAVENOUS | Status: AC
  Administered 2019-06-01: 500 mL via INTRAVENOUS

## 2019-06-01 MED ORDER — TICAGRELOR 90 MG PO TABS
180.0000 mg | ORAL_TABLET | Freq: Once | ORAL | Status: AC
  Administered 2019-06-01: 14:00:00 180 mg via ORAL

## 2019-06-01 MED ORDER — ASPIRIN 81 MG PO CHEW
CHEWABLE_TABLET | ORAL | Status: AC
  Administered 2019-06-01: 243 mg via ORAL
  Filled 2019-06-01: qty 3

## 2019-06-01 MED ORDER — TIROFIBAN (AGGRASTAT) BOLUS VIA INFUSION
INTRAVENOUS | Status: DC | PRN
  Administered 2019-06-01: 2665 ug via INTRAVENOUS

## 2019-06-01 MED ORDER — HEPARIN SODIUM (PORCINE) 5000 UNIT/ML IJ SOLN
4000.0000 [IU] | Freq: Once | INTRAMUSCULAR | Status: AC
  Administered 2019-06-01: 14:00:00 4000 [IU] via INTRAVENOUS
  Filled 2019-06-01: qty 1

## 2019-06-01 MED ORDER — NITROGLYCERIN 0.4 MG SL SUBL: 0.4000 mg | SUBLINGUAL_TABLET | SUBLINGUAL | Status: DC | PRN

## 2019-06-01 MED ORDER — HEPARIN (PORCINE) 25000 UT/250ML-% IV SOLN: 10.0000 [IU]/kg/h | INTRAVENOUS | Status: DC

## 2019-06-01 MED ORDER — ACETAMINOPHEN 325 MG PO TABS
650.0000 mg | ORAL_TABLET | ORAL | Status: DC | PRN
  Administered 2019-06-01: 19:00:00 650 mg via ORAL
  Filled 2019-06-01: qty 2

## 2019-06-01 MED ORDER — CARVEDILOL 25 MG PO TABS
25.0000 mg | ORAL_TABLET | Freq: Two times a day (BID) | ORAL | Status: DC
  Administered 2019-06-01: 25 mg via ORAL
  Filled 2019-06-01: qty 1

## 2019-06-01 MED ORDER — ACETAMINOPHEN 325 MG PO TABS
650.0000 mg | ORAL_TABLET | ORAL | Status: DC | PRN
  Administered 2019-06-02: 650 mg via ORAL
  Filled 2019-06-01: qty 2

## 2019-06-01 MED ORDER — ASPIRIN EC 81 MG PO TBEC: 81.0000 mg | DELAYED_RELEASE_TABLET | Freq: Every day | ORAL | Status: DC

## 2019-06-01 MED ORDER — TICAGRELOR 90 MG PO TABS: 90.0000 mg | ORAL_TABLET | Freq: Two times a day (BID) | ORAL | Status: DC

## 2019-06-01 MED ORDER — HYDRALAZINE HCL 20 MG/ML IJ SOLN: 10.0000 mg | INTRAMUSCULAR | Status: AC | PRN

## 2019-06-01 MED ORDER — HEPARIN (PORCINE) 25000 UT/250ML-% IV SOLN
INTRAVENOUS | Status: AC | PRN
  Administered 2019-06-01: 10 [IU]/kg/h via INTRAVENOUS

## 2019-06-01 MED ORDER — MORPHINE SULFATE (PF) 4 MG/ML IV SOLN
4.0000 mg | Freq: Once | INTRAVENOUS | Status: AC
  Administered 2019-06-01: 14:00:00 4 mg via INTRAVENOUS
  Filled 2019-06-01: qty 1

## 2019-06-01 MED ORDER — HEPARIN (PORCINE) 25000 UT/250ML-% IV SOLN: 1200.0000 [IU]/h | INTRAVENOUS | Status: DC

## 2019-06-01 MED ORDER — FENTANYL CITRATE (PF) 100 MCG/2ML IJ SOLN
INTRAMUSCULAR | Status: AC
  Filled 2019-06-01: qty 2

## 2019-06-01 SURGICAL SUPPLY — 17 items
BALLN IABP SENSA PLUS 8F 50CC (BALLOONS) ×3
BALLOON IABP SENS PLUS 8F 50CC (BALLOONS) IMPLANT
CATH INFINITI 5FR ANG PIGTAIL (CATHETERS) ×2 IMPLANT
CATH INFINITI 5FR JL4 (CATHETERS) ×2 IMPLANT
CATH LAUNCHER 6FR AR1 SH (CATHETERS) ×2 IMPLANT
CATH VISTA GUIDE 6FR JR4 SH (CATHETERS) ×2 IMPLANT
DEVICE CLOSURE MYNXGRIP 6/7F (Vascular Products) IMPLANT
DEVICE INFLAT 30 PLUS (MISCELLANEOUS) ×2 IMPLANT
KIT MANI 3VAL PERCEP (MISCELLANEOUS) ×3 IMPLANT
KIT TRANSPAC II SGL 4260605 (MISCELLANEOUS) ×2 IMPLANT
NDL PERC 18GX7CM (NEEDLE) IMPLANT
NEEDLE PERC 18GX7CM (NEEDLE) ×3 IMPLANT
PACK CARDIAC CATH (CUSTOM PROCEDURE TRAY) ×3 IMPLANT
SHEATH AVANTI 6FR X 11CM (SHEATH) ×2 IMPLANT
SUT SILK 0 FSL (SUTURE) ×2 IMPLANT
WIRE G HI TQ BMW 190 (WIRE) ×2 IMPLANT
WIRE GUIDERIGHT .035X150 (WIRE) ×2 IMPLANT

## 2019-06-01 NOTE — Progress Notes (Addendum)
ANTICOAGULATION CONSULT NOTE - Initial Consult  Pharmacy Consult for heparin + tirofiban Indication: chest pain/ACS + IABP  No Known Allergies  Patient Measurements:   Heparin Dosing Weight: 100kg  Vital Signs: Temp: 98.2 F (36.8 C) (09/05 1317) Temp Source: Oral (09/05 1317) BP: 153/49 (09/05 1606) Pulse Rate: 67 (09/05 1415)  Labs: Recent Labs    06/01/19 1330 06/01/19 1611  HGB 15.7  --   HCT 46.9  --   PLT 224  --   APTT 24  --   LABPROT 12.9  --   INR 1.0  --   CREATININE 1.87*  --   TROPONINIHS 8 2,089*    Estimated Creatinine Clearance: 48.4 mL/min (A) (by C-G formula based on SCr of 1.87 mg/dL (H)).   Medical History: Past Medical History:  Diagnosis Date  . Arthritis   . CAD (coronary artery disease)   . Hyperlipidemia   . Myocardial infarction [facility]) 2005     Assessment: 26 yoM admitted to [facility] as STEMI with failed PCI. Pt now transferred to [facility] s/p IABP placement with plans for staged PCI vs CABG. Pt currently on heparin and tirofiban upon transfer, pharmacy asked to continue. No AC noted PTA, pt received ticagrelor in cath lab, holding for now. CrCl ~48 ml/min.  Goal of Therapy:  Heparin level 0.3-0.5 units/ml Monitor platelets by anticoagulation protocol: Yes   Plan:  -Tirofiban 0.075 mcg/kg/min - per Dr. Tamala Julian, continue indefinitely for now until plans sorted out -Continue heparin 1000 units/hr - check 6hr heparin level from start -Daily heparin level and CBC  ADDENDUM: Initial heparin level undetectable. Per RN, IV site may have been leaking slightly but this has been rectified. Mild oozing at sheath site, no other S/Sx bleeding.   Plan: -Increase heparin to 1200 units/hr -Recheck heparin level in 6 hr with morning labs   Arrie Senate, PharmD, BCPS Clinical Pharmacist [phone] Please check AMION for all [place] numbers 06/01/2019

## 2019-06-01 NOTE — ED Notes (Signed)
MD Rockey Situ, cardiology team arrived at bedside

## 2019-06-01 NOTE — ED Triage Notes (Addendum)
Pt arrived via POV with reports sudden onset of chest pain while cleaning fireplace.  Pt states the pain started about 90 mins PTA.  Pt states the pain is in central chest radiating to left shoulder and left arm.  Pt reports shortness of breath and dizziness when the pain started. Pt states he got nauseous and vomited and also had diarrhea.  Pt states he took 1 dose of NTG that did not relieve the pain.  Pt takes 81mg  ASA daily, took 1 dose this morning.

## 2019-06-01 NOTE — Progress Notes (Signed)
  Subjective: I have reviewed Mr. Warne coronary angiograms demonstrating severe single-vessel CAD with diffuse significant disease of the dominant right coronary artery- the distal posterior descending appears to be in adequate target for surgical revascularization.  Patient presented with chest pain, mild ST segment elevation and had attempted PCI at [facility] which was unsuccessful.  However balloon pump placed at the end of the procedure is stabilized patient's condition and resolved chest pain.  The patient was started on IV Aggrastat for evidence of distal right coronary thromboembolism during the procedure. The patient was also given a loading dose of Brilinta at the time of the attempted PCI.  The patient has surgical anatomy for grafting of the right coronary artery system however with the Brilinta load surgery would not be recommended unless his condition significantly deteriorates.  Would recommend post MI medical support and recovery to allow Brilinta washout and then surgical revascularization in about 4 days.    Objective: Vital signs in last 24 hours: Temp:  [98.2 F (36.8 C)] 98.2 F (36.8 C) (09/05 1317) Pulse Rate:  [61-177] 177 (09/05 1753) Cardiac Rhythm: Other (Comment) (09/05 1348) Resp:  [11-22] 22 (09/05 1753) BP: (95-178)/(49-112) 160/94 (09/05 1723) SpO2:  [97 %-100 %] 99 % (09/05 1753) Weight:  [106.6 kg] 106.6 kg (09/05 1321)  Hemodynamic parameters for last 24 hours:    Intake/Output from previous day: No intake/output data recorded. Intake/Output this shift: No intake/output data recorded.    Lab Results: Recent Labs    06/01/19 1330  WBC 13.0*  HGB 15.7  HCT 46.9  PLT 224   BMET:  Recent Labs    06/01/19 1330 06/01/19 1611  NA 140  --   K 6.1* 6.2*  CL 110  --   CO2 22  --   GLUCOSE 154*  --   BUN 31*  --   CREATININE 1.87*  --   CALCIUM 9.7  --     PT/INR:  Recent Labs    06/01/19 1330  LABPROT 12.9  INR 1.0   ABG No  results found for: PHART, HCO3, TCO2, ACIDBASEDEF, O2SAT CBG (last 3)  No results for input(s): GLUCAP in the last 72 hours.  Assessment/Plan: S/P  ST elevation, mild inferior MI now stable without chest pain on balloon pump Does not meet criteria for emergency CABG after Brilinta load  with stable clinical condition We will follow  LOS: 0 days    Tharon Aquas Trigt III 06/01/2019

## 2019-06-01 NOTE — H&P (Addendum)
Cardiology Admission History and Physical:   Patient ID: Andre Gallegos MRN: [id number]; DOB: [birth year]   Admission date: (Not on file)  Primary Care Provider: Guadalupe Maple, MD Primary Cardiologist: No primary care provider on file. Ubaldo Glassing, [facility]  Primary Electrophysiologist:  None   Chief Complaint:  STEMI  Patient Profile:   Andre Gallegos is a 68 y.o. male with inferior STEMI (inferior myocardial infarction in 2005 treated with mid to distal stent), hypertension, hyperlipidemia, and attempted emergency PCI at [facility] but failed due to inability to cross with wire due to vessel tortuosity..    History of Present Illness:   Andre Gallegos initially admitted to [facility] for chest pain.  Pain started today at about 11:30 AM.  It felt similar to 2005 but also had discomfort radiating into his shoulders.  Prior to that, he was asymptomatic, fully physically active, denying chest pain and dyspnea on exertion.  He is not diabetic.  He does have hypertension and hyperlipidemia.   After arriving in the emergency room at [facility] STEMI activation was set into play.  He was found to have a very calcified and tortuous proximal and mid RCA that prevented PCI ([place]). Balloon pump placed and shipped to [facility] for consideration of emergency surgery.  Patient was met in the ambulance bay.  The team states that when they arrived to pick the patient up he was still having mild chest discomfort.  In route to [facility], the discomfort completely resolved.  Currently the patient is completely pain-free.  He denies dyspnea.  No leg pain.  In the [facility] he received IV bivalirudin, IV Aggrastat, aspirin, and is currently comfortable.   Heart Pathway Score:     Past Medical History:  Diagnosis Date  . Arthritis   . CAD (coronary artery disease)   . Hyperlipidemia   . Myocardial infarction [facility])  2005    Past Surgical History:  Procedure Laterality Date  . APPENDECTOMY    . CARDIAC CATHETERIZATION    . COLONOSCOPY WITH PROPOFOL N/A 11/01/2016   Procedure: COLONOSCOPY WITH PROPOFOL;  Surgeon: Jonathon Bellows, MD;  Location: [facility] ENDOSCOPY;  Service: Endoscopy;  Laterality: N/A;  . CORONARY ANGIOPLASTY       Medications Prior to Admission: Prior to Admission medications   Medication Sig Start Date End Date Taking? Authorizing Provider  amLODipine (NORVASC) 10 MG tablet Take 1 tablet (10 mg total) by mouth daily. 10/15/18   Guadalupe Maple, MD  aspirin (ASPIRIN EC) 81 MG EC tablet Take 81 mg by mouth daily. Swallow whole.    [provider]  atorvastatin (LIPITOR) 40 MG tablet Take 1 tablet (40 mg total) by mouth daily at 6 PM. 10/15/18   Crissman, Jeannette How, MD  carvedilol (COREG) 25 MG tablet Take 1 tablet (25 mg total) by mouth 2 (two) times daily with a meal. 10/15/18   Crissman, Jeannette How, MD  ezetimibe (ZETIA) 10 MG tablet Take 1 tablet (10 mg total) by mouth daily. 10/15/18   Guadalupe Maple, MD  Lactobacillus (PROBIOTIC ACIDOPHILUS PO) Take 1 capsule by mouth.    [provider]  lisinopril (PRINIVIL,ZESTRIL) 40 MG tablet Take 1 tablet (40 mg total) by mouth daily. 10/15/18   Guadalupe Maple, MD  meloxicam (MOBIC) 7.5 MG tablet Take 1 tablet (7.5 mg total) by mouth daily. 10/15/18   Guadalupe Maple, MD     Allergies:   No Known Allergies  Social History:   Social History   Socioeconomic History  . Marital status: Married    Spouse name: Not on file  . Number of children: Not on file  . Years of education: Not on file  . Highest education level: Some college, no degree  Occupational History  . Occupation: retired  Scientific laboratory technician  . Financial resource strain: Not very hard  . Food insecurity    Worry: Never true    Inability: Never true  . Transportation needs    Medical: No    Non-medical: No  Tobacco Use  . Smoking status: Former Smoker    Types:  Cigarettes    Quit date: 1990    Years since quitting: 30.6  . Smokeless tobacco: Former Systems developer    Types: Snuff  . Tobacco comment: quit 30 years ago   Substance and Sexual Activity  . Alcohol use: Yes    Comment: occasionally   . Drug use: No  . Sexual activity: Not on file  Lifestyle  . Physical activity    Days per week: 0 days    Minutes per session: 0 min  . Stress: Not at all  Relationships  . Social connections    Talks on phone: More than three times a week    Gets together: Twice a week    Attends religious service: More than 4 times per year    Active member of club or organization: No    Attends meetings of clubs or organizations: Never    Relationship status: Married  . Intimate partner violence    Fear of current or ex partner: No    Emotionally abused: No    Physically abused: No    Forced sexual activity: No  Other Topics Concern  . Not on file  Social History Narrative  . Not on file    Family History: No family history of vascular disease. The patient's family history includes Lung cancer in his father.    ROS:  Please see the history of present illness.  No history of allergies, bleeding, lung disease, never a significant smoker, previously used chewing tobacco.  All other ROS reviewed and negative.     Physical Exam/Data:  There were no vitals filed for this visit. No intake or output data in the 24 hours ending 06/01/19 1558 Last 3 Weights 06/01/2019 10/31/2018 10/15/2018  Weight (lbs) 235 lb 239 lb 11.2 oz 238 lb  Weight (kg) 106.595 kg 108.727 kg 107.956 kg     There is no height or weight on file to calculate BMI.  General:  Well nourished with moderate obesity.  No acute distress.  Lying flat without difficulty. HEENT: normal Lymph: no adenopathy Neck: no JVD Endocrine:  No thryomegaly Vascular: No carotid bruits; FA pulses 2+ bilaterally without bruits  Cardiac:  normal S1, S2; RRR; no murmur  Lungs:  clear to auscultation bilaterally, no  wheezing, rhonchi or rales  Abd: soft, nontender, no hepatomegaly  Ext: Balloon pump in right femoral.  Right posterior tibial 1-2+.  No edema in either leg.  Left femoral 2+ edema Musculoskeletal:  No deformities, BUE and BLE strength normal and equal Skin: warm and dry  Neuro:  CNs 2-12 intact, no focal abnormalities noted Psych:  Normal affect    EKG:  The ECG that was done At 1:33 PM on 06/01/2019 was personally reviewed and demonstrates inferior ST elevation, inferior Q waves, reciprocal 1 and aVL ST-T abnormality.  Inferior Q waves predated today's admission.  ST  elevation is new.  Relevant CV Studies: Prior coronary angiography from earlier today at [facility] was reviewed and demonstrates globular thrombus in the mid RCA with evidence of embolization to PDA and continuation of right coronary.  The right coronary is heavily calcified, contains a previously placed distal stent, and is very tortuous.  The left main contains distal 40 to 50% stenosis, the proximal to mid LAD contains 40 to 60% stenosis in the region of the first diagonal origin.  The origin of the first diagonal is 90% obstructed.  The diagonal is relatively small in distribution.  The circumflex is small and free of any significant obstruction.  Inferior hypokinesis is noted on ventriculography.  EF is at least 50%.  LVEDP was 11 mmHg in the Cath Lab.  Laboratory Data:  High Sensitivity Troponin:   Recent Labs  Lab 06/01/19 1330  TROPONINIHS 8      Chemistry Recent Labs  Lab 06/01/19 1330  NA 140  K 6.1*  CL 110  CO2 22  GLUCOSE 154*  BUN 31*  CREATININE 1.87*  CALCIUM 9.7  GFRNONAA 36*  GFRAA 42*  ANIONGAP 8    No results for input(s): PROT, ALBUMIN, AST, ALT, ALKPHOS, BILITOT in the last 168 hours. Hematology Recent Labs  Lab 06/01/19 1330  WBC 13.0*  RBC 5.04  HGB 15.7  HCT 46.9  MCV 93.1  MCH 31.2  MCHC 33.5  RDW 12.7  PLT 224   BNPNo results for input(s): BNP, PROBNP  in the last 168 hours.  DDimer No results for input(s): DDIMER in the last 168 hours.   Radiology/Studies:  Dg Chest Port 1 View  Result Date: 06/01/2019 CLINICAL DATA:  Acute onset of chest pain radiating to the left shoulder and arm. EXAM: PORTABLE CHEST 1 VIEW COMPARISON:  None. FINDINGS: Borderline enlarged cardiac silhouette. Atherosclerotic plaque within the thoracic aorta. Pacer pads overlie the left ventricular apex. There is mild elevation/eventration of the right hemidiaphragm. Mild diffuse nodular thickening the pulmonary interstitium, potentially accentuated due to portable technique. No discrete focal airspace opacities. No pleural effusion or pneumothorax. No evidence of edema. No acute osseus abnormalities. IMPRESSION: No acute cardiopulmonary disease on this AP portable examination. Electronically Signed   By: Sandi Mariscal M.D.   On: 06/01/2019 14:04    Assessment and Plan:   1. Acute inferior ST elevation myocardial infarction due to thrombotic 99% occlusion of the mid right coronary with TIMI grade II flow.  Suspect embolization into the PDA and distal RCA continuation from the mid RCA.Marland Kitchen  Acute PCI intervention was attempted at [facility] but was technically not feasible due to tortuosity, calcification, and inability to advance the wire beyond the proximal third of the vessel.  Placement of intra-aortic balloon pump led to resolution of pain. 2. Acute on chronic kidney disease stage III: Gentle hydration 3. Hyperkalemia: Will reassess with repeat blood draw after hydration. 4. Hyperlipidemia: High intensity statin therapy.  RECOMMENDATIONS:   Resolution of chest discomfort after insertion of intra-aortic balloon pump.  It does appear that the patient received Brilinta at [facility].  Brilinta will be discontinued for the time being.  Aggrastat will be continued.  Beta-blocker therapy and high intensity statin therapy will be initiated.   IV heparin without a bolus will also be started.  Suspect the patient will probably need repeat catheterization early next week.  I do believe it would be very difficult to get a reasonable PCI result on the  right coronary.  The left coronary system has moderately disease with 40 to 50% distal left main, 50 to 70% calcified LAD beyond the first diagonal.  Ostial 90% first diagonal.  I did not see significant disease in the circumflex which is small..  Plan heart team approach.  Dr. Prescott Gum knows about the patient and would be willing to operate in several days after he becomes stable.  Continue intra-aortic balloon pump for at least 24 to 36 hours.  IV nitroglycerin will be added.  IV fluids will be given to resolve acute on chronic kidney injury.  Discontinue lisinopril and meloxicam for now.  After IV fluid administration is started, dose of Lasix will be given to hopefully help decrease potassium.  After IV hydration, a dose of IV Lasix may be given if potassium remains high.  May need tolvaptan.  High intensity statin therapy will be started increasing atorvastatin to 80 mg daily..  Severity of Illness: The appropriate patient status for this patient is INPATIENT. Inpatient status is judged to be reasonable and necessary in order to provide the required intensity of service to ensure the patient's safety. The patient's presenting symptoms, physical exam findings, and initial radiographic and laboratory data in the context of their chronic comorbidities is felt to place them at high risk for further clinical deterioration. Furthermore, it is not anticipated that the patient will be medically stable for discharge from the hospital within 2 midnights of admission. The following factors support the patient status of inpatient.   " The patient's presenting symptoms include chest pain. " The worrisome physical exam findings include none. " The initial radiographic and laboratory data are worrisome  because of high-grade thrombotic disease in the right coronary as outlined above. " The chronic co-morbidities include hypertension.   * I certify that at the point of admission it is my clinical judgment that the patient will require inpatient hospital care spanning beyond 2 midnights from the point of admission due to high intensity of service, high risk for further deterioration and high frequency of surveillance required.*    For questions or updates, please contact [place] Please consult [email or web address] for contact info under   CRITICAL CARE TIME: 1 hour and 30 minutes that includes review of digital images, discussion with CV surgeon, history, physical, and medical management strategy of several complex issues including kidney injury, hyperkalemia, and acute thrombotic inferior infarction.    Signed, Sinclair Grooms, MD  06/01/2019 3:58 PM

## 2019-06-01 NOTE — Consult Note (Signed)
ANTICOAGULATION CONSULT NOTE - Initial Consult  Pharmacy Consult for Heparin infusion dosing and monitoring  Indication: chest pain/ACS  No Known Allergies  Patient Measurements: Height: 6' (182.9 cm) Weight: 235 lb (106.6 kg) IBW/kg (Calculated) : 77.6 Heparin Dosing Weight: 99.9 kg  Vital Signs: Temp: 98.2 F (36.8 C) (09/05 1317) Temp Source: Oral (09/05 1317) BP: 135/53 (09/05 1330) Pulse Rate: 76 (09/05 1330)  Labs: Recent Labs    06/01/19 1330  HGB 15.7  HCT 46.9  PLT 224  LABPROT 12.9  INR 1.0    CrCl cannot be calculated (Patient's most recent lab result is older than the maximum 21 days allowed.).   Medical History: Past Medical History:  Diagnosis Date  . Arthritis   . CAD (coronary artery disease)   . Hyperlipidemia   . Myocardial infarction [facility]) 2005    Assessment: Pharmacy consulted for heparin infusion dosing and monitoring for ACS/STEMI  Goal of Therapy:  Heparin level 0.3-0.7 units/ml Monitor platelets by anticoagulation protocol: Yes   Plan:  Baseline labs ordered 4000 units bolus x 1 ordered by ED provider  Start heparin infusion at 1200 units/hr Check anti-Xa level in 6 hours and daily while on heparin Continue to monitor H&H and platelets  Pernell Dupre, PharmD, BCPS Clinical Pharmacist 06/01/2019 2:11 PM

## 2019-06-01 NOTE — ED Notes (Signed)
Son Christean Grief at bedside.

## 2019-06-01 NOTE — ED Provider Notes (Signed)
[facility] Emergency Department Provider Note  ____________________________________________   None    (approximate)  I have reviewed the triage vital signs and the nursing notes.   HISTORY  Chief Complaint Chest Pain    HPI Andre Gallegos is a 68 y.o. male with coronary disease prior stent placement years ago who presents with chest pain.  Patient had sudden onset of severe chest pain in the middle of his abdomen that radiates into his left shoulder.  The pain is constant, nothing makes it better, nothing makes it worse.  He denies any history of bleeding.  Denies any numbness in his arms or legs.  No history of high blood pressure.  This feels similar to his prior heart attack.          Past Medical History:  Diagnosis Date  . Arthritis   . CAD (coronary artery disease)   . Hyperlipidemia   . Myocardial infarction [facility]) 2005    Patient Active Problem List   Diagnosis Date Noted  . Advanced care planning/counseling discussion 10/15/2018  . Dupuytren's contracture of both hands 10/05/2017  . Seborrheic keratosis 10/05/2017  . Benign neoplasm of cecum   . Hx of colonic polyps   . Diverticulosis of large intestine without diverticulitis   . First degree hemorrhoids   . BPH (benign prostatic hyperplasia) 09/07/2016  . Non-ST elevation myocardial infarction (NSTEMI), subendocardial infarction, subsequent episode of care ([place]) 09/10/2015  . Benign essential hypertension 02/12/2015  . LVH (left ventricular hypertrophy) due to hypertensive disease, without heart failure 08/27/2014  . Coronary artery disease 02/07/2014  . Hyperlipemia, mixed 02/07/2014    Past Surgical History:  Procedure Laterality Date  . APPENDECTOMY    . CARDIAC CATHETERIZATION    . COLONOSCOPY WITH PROPOFOL N/A 11/01/2016   Procedure: COLONOSCOPY WITH PROPOFOL;  Surgeon: Jonathon Bellows, MD;  Location: [facility] ENDOSCOPY;  Service: Endoscopy;  Laterality: N/A;  . CORONARY  ANGIOPLASTY      Prior to Admission medications   Medication Sig Start Date End Date Taking? Authorizing Provider  amLODipine (NORVASC) 10 MG tablet Take 1 tablet (10 mg total) by mouth daily. 10/15/18   Guadalupe Maple, MD  aspirin (ASPIRIN EC) 81 MG EC tablet Take 81 mg by mouth daily. Swallow whole.    [provider]  atorvastatin (LIPITOR) 40 MG tablet Take 1 tablet (40 mg total) by mouth daily at 6 PM. 10/15/18   Crissman, Jeannette How, MD  carvedilol (COREG) 25 MG tablet Take 1 tablet (25 mg total) by mouth 2 (two) times daily with a meal. 10/15/18   Crissman, Jeannette How, MD  ezetimibe (ZETIA) 10 MG tablet Take 1 tablet (10 mg total) by mouth daily. 10/15/18   Guadalupe Maple, MD  Lactobacillus (PROBIOTIC ACIDOPHILUS PO) Take 1 capsule by mouth.    [provider]  lisinopril (PRINIVIL,ZESTRIL) 40 MG tablet Take 1 tablet (40 mg total) by mouth daily. 10/15/18   Guadalupe Maple, MD  meloxicam (MOBIC) 7.5 MG tablet Take 1 tablet (7.5 mg total) by mouth daily. 10/15/18   Guadalupe Maple, MD    Allergies Patient has no known allergies.  Family History  Problem Relation Age of Onset  . Lung cancer Father     Social History Social History   Tobacco Use  . Smoking status: Former Smoker    Types: Cigarettes    Quit date: 1990    Years since quitting: 30.6  . Smokeless tobacco: Former Systems developer  Types: Snuff  . Tobacco comment: quit 30 years ago   Substance Use Topics  . Alcohol use: Yes    Comment: occasionally   . Drug use: No      Review of Systems Constitutional: No fever/chills Eyes: No visual changes. ENT: No sore throat. Cardiovascular: Positive chest pain Respiratory: Denies shortness of breath. Gastrointestinal: No abdominal pain.  No nausea, no vomiting.  No diarrhea.  No constipation. Genitourinary: Negative for dysuria. Musculoskeletal: Negative for back pain. Skin: Negative for rash. Neurological: Negative for headaches, focal weakness or numbness.  All other ROS negative ____________________________________________   PHYSICAL EXAM:  VITAL SIGNS: ED Triage Vitals  Enc Vitals Group     BP 06/01/19 1317 130/61     Pulse Rate 06/01/19 1317 61     Resp 06/01/19 1317 20     Temp 06/01/19 1317 98.2 F (36.8 C)     Temp Source 06/01/19 1317 Oral     SpO2 06/01/19 1317 97 %     Weight 06/01/19 1321 235 lb (106.6 kg)     Height 06/01/19 1321 6' (1.829 m)     Head Circumference --      Peak Flow --      Pain Score 06/01/19 1321 6     Pain Loc --      Pain Edu? --      Excl. in [place]? --     Constitutional: Alert and oriented. Well appearing and in no acute distress. Eyes: Conjunctivae are normal. EOMI. Head: Atraumatic. Nose: No congestion/rhinnorhea. Mouth/Throat: Mucous membranes are moist.   Neck: No stridor. Trachea Midline. FROM Cardiovascular: Normal rate, regular rhythm. Grossly normal heart sounds.  Good peripheral circulation. Radial and DP pulses equal  Respiratory: Normal respiratory effort.  No retractions. Lungs CTAB. Gastrointestinal: Soft and nontender. No distention. No abdominal bruits.  Musculoskeletal: No lower extremity tenderness nor edema.  No joint effusions. Neurologic:  Normal speech and language. No gross focal neurologic deficits are appreciated.  Skin:  Skin is warm, dry and intact. No rash noted. Psychiatric: Mood and affect are normal. Speech and behavior are normal. GU: Deferred   ____________________________________________   LABS (all labs ordered are listed, but only abnormal results are displayed)  Labs Reviewed  BASIC METABOLIC PANEL  CBC  TROPONIN I (HIGH SENSITIVITY)   ____________________________________________   ED ECG REPORT I, Vanessa Freeland, the attending physician, personally viewed and interpreted this ECG.  Initial EKG shows ST elevation in leads III, aVF and II with little aVL depression.  Type I AV block.  Normal intervals.  Repeat EKG shows continued ST elevation and  depression in aVL.  Otherwise normal intervals except for type I AV block, T wave version in aVL. ____________________________________________  Per[place], personally viewed and evaluated these images (plain radiographs) as part of my medical decision making, as well as reviewing the written report by the radiologist.  ED MD interpretation:  No widen mediastinum   Official radiology report(s): Dg Chest Port 1 View  Result Date: 06/01/2019 CLINICAL DATA:  Acute onset of chest pain radiating to the left shoulder and arm. EXAM: PORTABLE CHEST 1 VIEW COMPARISON:  None. FINDINGS: Borderline enlarged cardiac silhouette. Atherosclerotic plaque within the thoracic aorta. Pacer pads overlie the left ventricular apex. There is mild elevation/eventration of the right hemidiaphragm. Mild diffuse nodular thickening the pulmonary interstitium, potentially accentuated due to portable technique. No discrete focal airspace opacities. No pleural effusion or pneumothorax. No evidence of edema. No acute osseus  abnormalities. IMPRESSION: No acute cardiopulmonary disease on this AP portable examination. Electronically Signed   By: Sandi Mariscal M.D.   On: 06/01/2019 14:04    ____________________________________________   PROCEDURES  Procedure(s) performed (including Critical Care):  .Critical Care Performed by: Vanessa [place], MD Authorized by: Vanessa [place], MD   Critical care provider statement:    Critical care time (minutes):  30   Critical care was necessary to treat or prevent imminent or life-threatening deterioration of the following conditions:  Cardiac failure   Critical care was time spent personally by me on the following activities:  Discussions with consultants, evaluation of patient's response to treatment, examination of patient, ordering and performing treatments and interventions, ordering and review of laboratory studies, ordering and review of radiographic studies, pulse  oximetry, re-evaluation of patient's condition, obtaining history from patient or surrogate and review of old charts     ____________________________________________   INITIAL IMPRESSION / [place] / ED COURSE   JERAD GARDIPEE was evaluated in Emergency Department on 06/01/2019 for the symptoms described in the history of present illness. He was evaluated in the context of the global COVID-19 pandemic, which necessitated consideration that the patient might be at risk for infection with the SARS-CoV-2 virus that causes COVID-19. Institutional protocols and algorithms that pertain to the evaluation of patients at risk for COVID-19 are in a state of rapid change based on information released by regulatory bodies including the CDC and federal and state organizations. These policies and algorithms were followed during the patient's care in the ED.    Pt presents with EKG consistent with STEMI. Brought back to room immediately.   DDx that was also considered d/t potential to cause harm, but was found less likely based on history and physical (as detailed above): -PNA (no fevers, cough but CXR to evaluate) -PNX (reassured with equal b/l breath sounds, CXR to evaluate) -Symptomatic anemia (will get H&H) -Pulmonary embolism as no sob at rest, not pleuritic in nature, no hypoxia -Aortic Dissection as no tearing pain and no radiation to the mid back, pulses equal -Pericarditis no rub on exam, EKG changes or hx to suggest dx -Tamponade (no notable SOB, tachycardic, hypotensive) -Esophageal rupture (no h/o diffuse vomitting/no crepitus)   Patient EKG is concerning for STEMI.  STEMI alert called.  Patient given full dose aspirin, heparin bolus and morphine.  Will hold off on nitro given concern for possible inferior MI.  Pt taken to cath lab.   Admit to hospital.        ____________________________________________   FINAL CLINICAL IMPRESSION(S) / ED DIAGNOSES   Final  diagnoses:  ST elevation myocardial infarction (STEMI), unspecified artery ([place])     MEDICATIONS GIVEN DURING THIS VISIT:  Medications  aspirin chewable tablet 243 mg (243 mg Oral Given 06/01/19 1344)  heparin injection 4,000 Units (4,000 Units Intravenous Given 06/01/19 1342)  morphine 4 MG/ML injection 4 mg (4 mg Intravenous Given 06/01/19 1339)  ticagrelor (BRILINTA) tablet 180 mg (180 mg Oral Given 06/01/19 1404)  sodium chloride 0.9 % bolus 500 mL (500 mLs Intravenous New Bag/Given 06/01/19 1407)  tirofiban (AGGRASTAT) infusion 50 mcg/mL 250 mL (0.075 mcg/kg/min  106.6 kg Intravenous New Bag/Given 06/01/19 1507)  heparin ADULT infusion 100 units/mL (25000 units/219mL sodium chloride 0.45%) (10 Units/kg/hr  106.6 kg Intravenous New Bag/Given 06/01/19 1558)  midazolam (VERSED) 2 MG/2ML injection (has no administration in time range)  fentaNYL (SUBLIMAZE) 100 MCG/2ML injection (has no administration in time range)  nitroGLYCERIN  100 mcg/mL intra-arterial injection (has no administration in time range)  bivalirudin (ANGIOMAX) 250 MG injection (has no administration in time range)  Heparin (Porcine) in NaCl 1000-0.9 UT/500ML-% SOLN (has no administration in time range)  heparin 25000-0.45 UT/250ML-% infusion (has no administration in time range)     ED Discharge Orders    None       Note:  This document was prepared using Dragon voice recognition software and may include unintentional dictation errors.   Vanessa [place], MD 06/01/19 [phone]

## 2019-06-02 ENCOUNTER — Encounter (HOSPITAL_COMMUNITY): Payer: Self-pay

## 2019-06-02 ENCOUNTER — Inpatient Hospital Stay (HOSPITAL_COMMUNITY): Payer: Medicare Other

## 2019-06-02 DIAGNOSIS — I2510 Atherosclerotic heart disease of native coronary artery without angina pectoris: Secondary | ICD-10-CM

## 2019-06-02 LAB — ECHOCARDIOGRAM COMPLETE
Height: 72 in
Weight: 3763.69 oz

## 2019-06-02 LAB — URINALYSIS, ROUTINE W REFLEX MICROSCOPIC
Bacteria, UA: NONE SEEN
Bilirubin Urine: NEGATIVE
Glucose, UA: 50 mg/dL — AB
Ketones, ur: NEGATIVE mg/dL
Leukocytes,Ua: NEGATIVE
Nitrite: NEGATIVE
Protein, ur: NEGATIVE mg/dL
Specific Gravity, Urine: 1.011 (ref 1.005–1.030)
pH: 5 (ref 5.0–8.0)

## 2019-06-02 LAB — BASIC METABOLIC PANEL
Anion gap: 11 (ref 5–15)
BUN: 25 mg/dL — ABNORMAL HIGH (ref 8–23)
CO2: 19 mmol/L — ABNORMAL LOW (ref 22–32)
Calcium: 8.9 mg/dL (ref 8.9–10.3)
Chloride: 108 mmol/L (ref 98–111)
Creatinine, Ser: 1.46 mg/dL — ABNORMAL HIGH (ref 0.61–1.24)
GFR calc Af Amer: 57 mL/min — ABNORMAL LOW (ref >60)
GFR calc non Af Amer: 49 mL/min — ABNORMAL LOW (ref >60)
Glucose, Bld: 145 mg/dL — ABNORMAL HIGH (ref 70–99)
Potassium: 4.2 mmol/L (ref 3.5–5.1)
Sodium: 138 mmol/L (ref 135–145)

## 2019-06-02 LAB — CBC
HCT: 41.1 % (ref 39.0–52.0)
Hemoglobin: 14.1 g/dL (ref 13.0–17.0)
MCH: 32 pg (ref 26.0–34.0)
MCHC: 34.3 g/dL (ref 30.0–36.0)
MCV: 93.4 fL (ref 80.0–100.0)
Platelets: 197 10*3/uL (ref 150–400)
RBC: 4.4 MIL/uL (ref 4.22–5.81)
RDW: 12.8 % (ref 11.5–15.5)
WBC: 10.9 10*3/uL — ABNORMAL HIGH (ref 4.0–10.5)
nRBC: 0 % (ref 0.0–0.2)

## 2019-06-02 LAB — GLUCOSE, CAPILLARY
Glucose-Capillary: 138 mg/dL — ABNORMAL HIGH (ref 70–99)
Glucose-Capillary: 163 mg/dL — ABNORMAL HIGH (ref 70–99)
Glucose-Capillary: 136 mg/dL — ABNORMAL HIGH (ref 70–99)
Glucose-Capillary: 117 mg/dL — ABNORMAL HIGH (ref 70–99)

## 2019-06-02 LAB — SURGICAL PCR SCREEN
MRSA, PCR: NEGATIVE
Staphylococcus aureus: NEGATIVE

## 2019-06-02 LAB — LIPID PANEL
Cholesterol: 113 mg/dL (ref 0–200)
HDL: 27 mg/dL — ABNORMAL LOW (ref >40)
LDL Cholesterol: 19 mg/dL (ref 0–99)
Total CHOL/HDL Ratio: 4.2 RATIO
Triglycerides: 334 mg/dL — ABNORMAL HIGH (ref <150)
VLDL: 67 mg/dL — ABNORMAL HIGH (ref 0–40)

## 2019-06-02 LAB — HEPARIN LEVEL (UNFRACTIONATED)
Heparin Unfractionated: 0.32 IU/mL (ref 0.30–0.70)
Heparin Unfractionated: 0.32 IU/mL (ref 0.30–0.70)

## 2019-06-02 MED ORDER — PERFLUTREN LIPID MICROSPHERE
1.0000 mL | INTRAVENOUS | Status: AC | PRN
  Administered 2019-06-02: 13:00:00 4 mL via INTRAVENOUS
  Filled 2019-06-02: qty 10

## 2019-06-02 MED ORDER — PERFLUTREN LIPID MICROSPHERE
INTRAVENOUS | Status: AC
  Filled 2019-06-02: qty 10

## 2019-06-02 MED ORDER — CARVEDILOL 12.5 MG PO TABS
12.5000 mg | ORAL_TABLET | Freq: Two times a day (BID) | ORAL | Status: DC
  Administered 2019-06-02: 17:00:00 12.5 mg via ORAL
  Filled 2019-06-02: qty 1

## 2019-06-02 MED ORDER — DIPHENHYDRAMINE HCL 25 MG PO CAPS
25.0000 mg | ORAL_CAPSULE | Freq: Every evening | ORAL | Status: DC | PRN
  Administered 2019-06-02 – 2019-06-03 (×2): 25 mg via ORAL
  Filled 2019-06-02 (×2): qty 1

## 2019-06-02 NOTE — Progress Notes (Signed)
  Subjective: Patient remains stable on balloon pump and IV heparin and Aggrastat Cardiac enzymes remained only minimally elevated Echocardiogram performed today, report pending Plan CABG after Brilinta washout, either Wednesday or Thursday. Objective: Vital signs in last 24 hours: Temp:  [98 F (36.7 C)-98.4 F (36.9 C)] 98 F (36.7 C) (09/06 1152) Pulse Rate:  [61-177] 70 (09/06 1110) Cardiac Rhythm: Normal sinus rhythm (09/06 1100) Resp:  [10-22] 16 (09/06 1110) BP: (95-182)/(47-112) 140/54 (09/06 1110) SpO2:  [92 %-100 %] 98 % (09/06 1110) Arterial Line BP: (140)/(65) 140/65 (09/05 1811) Weight:  [106.6 kg-106.7 kg] 106.7 kg (09/06 0600)  Hemodynamic parameters for last 24 hours:   Stable Intake/Output from previous day: 09/05 0701 - 09/06 0700 In: 1519 [I.V.:1519] Out: 1000 [Urine:1000] Intake/Output this shift: Total I/O In: 416.3 [P.O.:240; I.V.:176.3] Out: 400 [Urine:400]  Alert and comfortable Lungs clear No bleeding from balloon site in right groin Neuro intact No bleeding problems Lab Results: Recent Labs    06/01/19 2102 06/02/19 0706  WBC 13.6* 10.9*  HGB 14.8 14.1  HCT 42.5 41.1  PLT 197 197   BMET:  Recent Labs    06/01/19 2102 06/02/19 0537  NA 136 138  K 4.9 4.2  CL 109 108  CO2 19* 19*  GLUCOSE 101* 145*  BUN 23 25*  CREATININE 1.46* 1.46*  CALCIUM 9.1 8.9    PT/INR:  Recent Labs    06/01/19 1330  LABPROT 12.9  INR 1.0   ABG No results found for: PHART, HCO3, TCO2, ACIDBASEDEF, O2SAT CBG (last 3)  Recent Labs    06/02/19 0607 06/02/19 0826 06/02/19 1149  GLUCAP 138* 163* 136*    Assessment/Plan: S/P  Probably wean and remove balloon pump after Aggrastat is off CABG Wednesday or Thursday next week after Brilinta load on Saturday  LOS: 1 day    Andre Gallegos 06/02/2019

## 2019-06-02 NOTE — Progress Notes (Addendum)
  Echocardiogram 2D Echocardiogram has been performed.  Andre Gallegos 06/02/2019, 1:13 PM

## 2019-06-02 NOTE — Plan of Care (Signed)
Patient progressing 

## 2019-06-02 NOTE — Progress Notes (Addendum)
ANTICOAGULATION CONSULT NOTE - Follow Up Consult  Pharmacy Consult for heparin + tirofiban Indication: chest pain/ACS + IABP  No Known Allergies  Patient Measurements: Weight: 235 lb 3.7 oz (106.7 kg) Heparin Dosing Weight: 100kg  Vital Signs: Temp: 98 F (36.7 C) (09/06 1152) Temp Source: Oral (09/06 1152) BP: 150/49 (09/06 1300) Pulse Rate: 73 (09/06 1200)  Labs: Recent Labs    06/01/19 1330 06/01/19 1611 06/01/19 2102 06/02/19 0537 06/02/19 0706 06/02/19 1303  HGB 15.7  --  14.8  --  14.1  --   HCT 46.9  --  42.5  --  41.1  --   PLT 224  --  197  --  197  --   APTT 24  --   --   --   --   --   LABPROT 12.9  --   --   --   --   --   INR 1.0  --   --   --   --   --   HEPARINUNFRC  --   --  <0.10* 0.32  --  0.32  CREATININE 1.87*  --  1.46* 1.46*  --   --   TROPONINIHS 8 2,089*  --   --   --   --     Estimated Creatinine Clearance: 61.9 mL/min (A) (by C-G formula based on SCr of 1.46 mg/dL (H)).   Medical History: Past Medical History:  Diagnosis Date  . Arthritis   . CAD (coronary artery disease)   . Hyperlipidemia   . Myocardial infarction [facility]) 2005     Assessment: 63 yoM admitted to [facility] as STEMI with failed PCI. Pt now transferred to [facility] s/p IABP placement with plans for CABG on Wednesday or Thursday after Brilinta washout. Pt currently on heparin and tirofiban, pharmacy asked to continue. No AC noted PTA. CrCl improved to >60 ml/min.  Heparin level therapeutic x 2. No further infusion issues noted per RN. CBC wnl. No bleeding noted.  Goal of Therapy:  Heparin level 0.3-0.5 units/ml Monitor platelets by anticoagulation protocol: Yes   Plan:  -Increase tirofiban to 0.15 mcg/kg/min given improvement in renal function -Will follow up with MD about timing to stop tirofiban  -Continue heparin 1200 units/hr -Daily heparin level and CBC   Addendum: -Dr. Percival Spanish recommending a total of 24h of tirofiban, then stop -Tirofiban was loaded and gtt started  yesterday at 1506 per cath procedure log -Will add stop time for today at [place], PharmD PGY2 Cardiology Pharmacy Resident Phone [phone] 06/02/2019       1:33 PM  Please check [email or web address] for unit-specific pharmacist phone numbers

## 2019-06-02 NOTE — Progress Notes (Addendum)
ANTICOAGULATION CONSULT NOTE - Follow Up Consult  Pharmacy Consult for heparin Indication: IABP  Labs: Recent Labs    06/01/19 1330 06/01/19 1611 06/01/19 2102 06/02/19 0537  HGB 15.7  --  14.8  --   HCT 46.9  --  42.5  --   PLT 224  --  197  --   APTT 24  --   --   --   LABPROT 12.9  --   --   --   INR 1.0  --   --   --   HEPARINUNFRC  --   --  <0.10* 0.32  CREATININE 1.87*  --  1.46*  --   TROPONINIHS 8 2,089*  --   --      Assessment/Plan:  68yo male therapeutic on heparin after rate change. Will continue gtt at current rate and confirm stable with additional level.   Wynona Neat, PharmD, BCPS  06/02/2019,6:25 AM

## 2019-06-02 NOTE — Progress Notes (Signed)
Progress Note  Patient Name: Andre Gallegos Date of Encounter: 06/02/2019  Primary Cardiologist:   No primary care provider on file.   Subjective   The patient has had no chest pain.  No SOB.    Inpatient Medications    Scheduled Meds: . amLODipine  5 mg Oral Daily  . aspirin EC  81 mg Oral Daily  . atorvastatin  80 mg Oral q1800  . carvedilol  25 mg Oral BID WC  . Chlorhexidine Gluconate Cloth  6 each Topical Daily  . mouth rinse  15 mL Mouth Rinse BID   Continuous Infusions: . sodium chloride 75 mL/hr at 06/02/19 0600  . sodium chloride    . heparin 1,200 Units/hr (06/02/19 0600)  . nitroGLYCERIN Stopped (06/02/19 0018)  . tirofiban 0.075 mcg/kg/min (06/02/19 0600)   PRN Meds: sodium chloride, acetaminophen, alum & mag hydroxide-simeth, nitroGLYCERIN, ondansetron (ZOFRAN) IV, sodium chloride flush   Vital Signs    Vitals:   06/02/19 0400 06/02/19 0500 06/02/19 0600 06/02/19 0700  BP: (!) 127/56 134/65 138/60 (!) 150/61  Pulse: 71 80 75 73  Resp: 20 15 14 15   Temp:      TempSrc:      SpO2: 92% 95% 99% 96%  Weight:   106.7 kg     Intake/Output Summary (Last 24 hours) at 06/02/2019 0757 Last data filed at 06/02/2019 0645 Gross per 24 hour  Intake 1422.5 ml  Output 1000 ml  Net 422.5 ml   Filed Weights   06/02/19 0600  Weight: 106.7 kg    Telemetry    NSR - Personally Reviewed  ECG    NSR, rate 79, Axis WNL, intervals WNL, old inferior MI. No acute ST T wave changes - Personally Reviewed  Physical Exam   GEN: No acute distress.   Neck: No  JVD Cardiac: RRR, no murmurs, rubs, or gallops.  Respiratory: Clear  to auscultation bilaterally. GI: Soft, nontender, non-distended  MS: No  edema; No deformity.  2 plus distal pulses. Right IABP access site without bleeding Neuro:  Nonfocal  Psych: Normal affect   Labs    Chemistry Recent Labs  Lab 06/01/19 1330 06/01/19 1611 06/01/19 2102 06/02/19 0537  NA 140  --  136 138  K 6.1* 6.2* 4.9 4.2   CL 110  --  109 108  CO2 22  --  19* 19*  GLUCOSE 154*  --  101* 145*  BUN 31*  --  23 25*  CREATININE 1.87*  --  1.46* 1.46*  CALCIUM 9.7  --  9.1 8.9  GFRNONAA 36*  --  49* 49*  GFRAA 42*  --  57* 57*  ANIONGAP 8  --  8 11     Hematology Recent Labs  Lab 06/01/19 1330 06/01/19 2102 06/02/19 0706  WBC 13.0* 13.6* 10.9*  RBC 5.04 4.64 4.40  HGB 15.7 14.8 14.1  HCT 46.9 42.5 41.1  MCV 93.1 91.6 93.4  MCH 31.2 31.9 32.0  MCHC 33.5 34.8 34.3  RDW 12.7 12.7 12.8  PLT 224 197 197    Cardiac EnzymesNo results for input(s): TROPONINI in the last 168 hours. No results for input(s): TROPIPOC in the last 168 hours.   BNP Recent Labs  Lab 06/01/19 2102  BNP 66.6     DDimer No results for input(s): DDIMER in the last 168 hours.   Radiology    Dg Chest Port 1 View  Result Date: 06/01/2019 CLINICAL DATA:  Acute onset of chest pain radiating  to the left shoulder and arm. EXAM: PORTABLE CHEST 1 VIEW COMPARISON:  None. FINDINGS: Borderline enlarged cardiac silhouette. Atherosclerotic plaque within the thoracic aorta. Pacer pads overlie the left ventricular apex. There is mild elevation/eventration of the right hemidiaphragm. Mild diffuse nodular thickening the pulmonary interstitium, potentially accentuated due to portable technique. No discrete focal airspace opacities. No pleural effusion or pneumothorax. No evidence of edema. No acute osseus abnormalities. IMPRESSION: No acute cardiopulmonary disease on this AP portable examination. Electronically Signed   By: Sandi Mariscal M.D.   On: 06/01/2019 14:04    Cardiac Studies    Prox RCA lesion is 50% stenosed.  Mid RCA lesion is 99% stenosed.  RPDA lesion is 99% stenosed.  RPAV lesion is 95% stenosed.  Prox LAD lesion is 60% stenosed.  1st Diag lesion is 90% stenosed.  Ost Cx to Prox Cx lesion is 50% stenosed.   Conclusion Mildly depressed left ventricular function with inferior hypokinesis ejection fraction around 45%  Left coronary system with moderate to severe coronary disease TIMI-3 flow RCA with severe disease in the mid and distal RCA tortuous heavily calcified vessel TIMI-3 flow Intervention Unsuccessful PCI and stent of mid RCA and distal lesion unable to traverse with the wire port guide support tortuous calcified vessel Successful placement of intra-aortic balloon pump to help with stabilization and symptoms Patient transferred to [facility] to Dr. Daneen Schick for evaluation of possible complex high risk intervention of RCA versus coronary bypass surgery   Patient Profile     68 y.o. male with inferior STEMI (inferior myocardial infarction in 2005 treated with mid to distal stent), hypertension, hyperlipidemia, and attempted emergency PCI at [facility] but failed due to inability to cross with wire due to vessel tortuosity..    Assessment & Plan    CAD:  Plan for surgical intervention in about 4 days when Brilinta has washed out.    Acute ST elevation MI.  Failed PCI of the RCA with distal embolization of the RCA.  On Aggrastat with balloon pump in place.  I will continue the balloon pump today and this could likely be taken out tomorrow per Dr. Thompson Caul suggestion.  Echo pending.     CKD III:  Holding ARB and meloxicam.   Creat is improved.     DYSLIPIDEMIA:   Lipitor 80 mg started.     HYPERKALEMIA:   Resolved.      ISCHEMIC CARDIOMYOPATHY:  EF appears to be around 45%.  Will KVO fluid.    For questions or updates, please contact [place] Please consult [email or web address] for contact info under Cardiology/STEMI.   Signed, Minus Breeding, MD  06/02/2019, 7:57 AM

## 2019-06-03 ENCOUNTER — Other Ambulatory Visit: Payer: Self-pay | Admitting: *Deleted

## 2019-06-03 ENCOUNTER — Inpatient Hospital Stay (HOSPITAL_COMMUNITY): Payer: Medicare Other

## 2019-06-03 DIAGNOSIS — I2510 Atherosclerotic heart disease of native coronary artery without angina pectoris: Secondary | ICD-10-CM

## 2019-06-03 LAB — COMPREHENSIVE METABOLIC PANEL
ALT: 37 U/L (ref 0–44)
AST: 63 U/L — ABNORMAL HIGH (ref 15–41)
Albumin: 3.6 g/dL (ref 3.5–5.0)
Alkaline Phosphatase: 52 U/L (ref 38–126)
Anion gap: 8 (ref 5–15)
BUN: 21 mg/dL (ref 8–23)
CO2: 21 mmol/L — ABNORMAL LOW (ref 22–32)
Calcium: 9 mg/dL (ref 8.9–10.3)
Chloride: 108 mmol/L (ref 98–111)
Creatinine, Ser: 1.56 mg/dL — ABNORMAL HIGH (ref 0.61–1.24)
GFR calc Af Amer: 52 mL/min — ABNORMAL LOW (ref >60)
GFR calc non Af Amer: 45 mL/min — ABNORMAL LOW (ref >60)
Glucose, Bld: 114 mg/dL — ABNORMAL HIGH (ref 70–99)
Potassium: 4.4 mmol/L (ref 3.5–5.1)
Sodium: 137 mmol/L (ref 135–145)
Total Bilirubin: 1.1 mg/dL (ref 0.3–1.2)
Total Protein: 5.9 g/dL — ABNORMAL LOW (ref 6.5–8.1)

## 2019-06-03 LAB — PROTIME-INR
INR: 1.1 (ref 0.8–1.2)
Prothrombin Time: 13.7 seconds (ref 11.4–15.2)

## 2019-06-03 LAB — CBC
HCT: 40.2 % (ref 39.0–52.0)
HCT: 43.1 % (ref 39.0–52.0)
Hemoglobin: 13.9 g/dL (ref 13.0–17.0)
Hemoglobin: 14.8 g/dL (ref 13.0–17.0)
MCH: 31.8 pg (ref 26.0–34.0)
MCH: 31.6 pg (ref 26.0–34.0)
MCHC: 34.6 g/dL (ref 30.0–36.0)
MCHC: 34.3 g/dL (ref 30.0–36.0)
MCV: 92 fL (ref 80.0–100.0)
MCV: 92.1 fL (ref 80.0–100.0)
Platelets: 162 K/uL (ref 150–400)
Platelets: 167 10*3/uL (ref 150–400)
RBC: 4.37 MIL/uL (ref 4.22–5.81)
RBC: 4.68 MIL/uL (ref 4.22–5.81)
RDW: 12.6 % (ref 11.5–15.5)
RDW: 12.6 % (ref 11.5–15.5)
WBC: 9.8 K/uL (ref 4.0–10.5)
WBC: 11.2 10*3/uL — ABNORMAL HIGH (ref 4.0–10.5)
nRBC: 0 % (ref 0.0–0.2)
nRBC: 0 % (ref 0.0–0.2)

## 2019-06-03 LAB — TSH: TSH: 5.052 u[IU]/mL — ABNORMAL HIGH (ref 0.350–4.500)

## 2019-06-03 LAB — PLATELET INHIBITION P2Y12: Platelet Function  P2Y12: 160 [PRU] — ABNORMAL LOW (ref 182–335)

## 2019-06-03 LAB — HEMOGLOBIN A1C
Hgb A1c MFr Bld: 5.8 % — ABNORMAL HIGH (ref 4.8–5.6)
Mean Plasma Glucose: 119.76 mg/dL

## 2019-06-03 LAB — HEPARIN LEVEL (UNFRACTIONATED)
Heparin Unfractionated: 0.38 [IU]/mL (ref 0.30–0.70)
Heparin Unfractionated: 0.15 IU/mL — ABNORMAL LOW (ref 0.30–0.70)

## 2019-06-03 LAB — GLUCOSE, CAPILLARY
Glucose-Capillary: 154 mg/dL — ABNORMAL HIGH (ref 70–99)
Glucose-Capillary: 123 mg/dL — ABNORMAL HIGH (ref 70–99)
Glucose-Capillary: 133 mg/dL — ABNORMAL HIGH (ref 70–99)

## 2019-06-03 LAB — TROPONIN I (HIGH SENSITIVITY): Troponin I (High Sensitivity): 7033 ng/L (ref <18)

## 2019-06-03 LAB — POCT ACTIVATED CLOTTING TIME: Activated Clotting Time: 114 seconds

## 2019-06-03 MED ORDER — CARVEDILOL 25 MG PO TABS
25.0000 mg | ORAL_TABLET | Freq: Two times a day (BID) | ORAL | Status: DC
  Administered 2019-06-03 – 2019-06-04 (×4): 25 mg via ORAL
  Filled 2019-06-03 (×4): qty 1

## 2019-06-03 MED ORDER — CARVEDILOL 25 MG PO TABS: 25.0000 mg | ORAL_TABLET | Freq: Two times a day (BID) | ORAL | Status: DC

## 2019-06-03 MED ORDER — CHLORHEXIDINE GLUCONATE CLOTH 2 % EX PADS
6.0000 | MEDICATED_PAD | Freq: Every day | CUTANEOUS | Status: DC
  Administered 2019-06-04 (×2): 6 via TOPICAL

## 2019-06-03 NOTE — Progress Notes (Signed)
Procedure(s) (LRB): CORONARY ARTERY BYPASS GRAFTING (CABG) (N/A) TRANSESOPHAGEAL ECHOCARDIOGRAM (TEE) (N/A) Subjective: Patient remained stable after non-STEMI Balloon pump being weaned and will be removed today P2 Y 12 pending after Aggrastat stopped 24 hours ago Plan multivessel CABG on Wednesday, September 9 Plan for CABG discussed with patient Objective: Vital signs in last 24 hours: Temp:  [97.8 F (36.6 C)-98.5 F (36.9 C)] 98.5 F (36.9 C) (09/07 0758) Pulse Rate:  [34-158] 124 (09/07 1100) Cardiac Rhythm: Normal sinus rhythm (09/07 0800) Resp:  [11-18] 15 (09/07 1100) BP: (110-165)/(49-84) 110/66 (09/07 1100) SpO2:  [92 %-98 %] 95 % (09/07 1100)  Hemodynamic parameters for last 24 hours:    Intake/Output from previous day: 09/06 0701 - 09/07 0700 In: 1425.1 [P.O.:720; I.V.:705.1] Out: 2300 [Urine:2300] Intake/Output this shift: Total I/O In: -  Out: 800 [Urine:800] Alert and responsive Lungs clear Sinus rhythm No bleeding from groin  Lab Results: Recent Labs    06/02/19 0706 06/03/19 0246  WBC 10.9* 9.8  HGB 14.1 13.9  HCT 41.1 40.2  PLT 197 162   BMET:  Recent Labs    06/02/19 0537 06/03/19 0246  NA 138 137  K 4.2 4.4  CL 108 108  CO2 19* 21*  GLUCOSE 145* 114*  BUN 25* 21  CREATININE 1.46* 1.56*  CALCIUM 8.9 9.0    PT/INR:  Recent Labs    06/03/19 0246  LABPROT 13.7  INR 1.1   ABG No results found for: PHART, HCO3, TCO2, ACIDBASEDEF, O2SAT CBG (last 3)  Recent Labs    06/02/19 0826 06/02/19 1149 06/02/19 1614  GLUCAP 163* 136* 117*    Assessment/Plan: S/P Procedure(s) (LRB): CORONARY ARTERY BYPASS GRAFTING (CABG) (N/A) TRANSESOPHAGEAL ECHOCARDIOGRAM (TEE) (N/A) CABG on Wednesday   LOS: 2 days    Tharon Aquas Trigt III 06/03/2019

## 2019-06-03 NOTE — Progress Notes (Signed)
ANTICOAGULATION CONSULT NOTE - Follow Up Consult  Pharmacy Consult for heparin Indication: chest pain/ACS + IABP  No Known Allergies  Patient Measurements: Height: 6' (182.9 cm) Weight: 235 lb 3.7 oz (106.7 kg) IBW/kg (Calculated) : 77.6 Heparin Dosing Weight: 100kg  Vital Signs: Temp: 97.8 F (36.6 C) (09/07 0400) Temp Source: Oral (09/07 0400) BP: 147/60 (09/07 0700) Pulse Rate: 63 (09/07 0700)  Labs: Recent Labs    06/01/19 1330 06/01/19 1611  06/01/19 2102 06/02/19 0537 06/02/19 0706 06/02/19 1303 06/03/19 0246  HGB 15.7  --   --  14.8  --  14.1  --  13.9  HCT 46.9  --   --  42.5  --  41.1  --  40.2  PLT 224  --   --  197  --  197  --  162  APTT 24  --   --   --   --   --   --   --   LABPROT 12.9  --   --   --   --   --   --  13.7  INR 1.0  --   --   --   --   --   --  1.1  HEPARINUNFRC  --   --    < > <0.10* 0.32  --  0.32 0.38  CREATININE 1.87*  --   --  1.46* 1.46*  --   --  1.56*  TROPONINIHS 8 2,089*  --   --   --   --   --   --    < > = values in this interval not displayed.    Estimated Creatinine Clearance: 58 mL/min (A) (by C-G formula based on SCr of 1.56 mg/dL (H)).   Medical History: Past Medical History:  Diagnosis Date  . Arthritis   . CAD (coronary artery disease)   . Hyperlipidemia   . Myocardial infarction [facility]) 2005     Assessment: 18 yoM admitted to [facility] as STEMI with failed PCI. Pt now transferred to [facility] s/p IABP placement with plans for CABG on Wednesday or Thursday after Brilinta washout. Pt transferred on heparin and tirofiban, pharmacy asked to continue. No AC noted PTA.  Heparin level therapeutic at 0.38. Now off tirofiban. CBC wnl. No bleeding noted.  Goal of Therapy:  Heparin level 0.3-0.5 units/ml Monitor platelets by anticoagulation protocol: Yes   Plan:  -Continue heparin at 1200 units/hr -Daily heparin level and CBC  -Monitor for bleeding  Vertis Kelch, PharmD PGY2 Cardiology Pharmacy Resident Phone [phone] 06/03/2019       7:41 AM  Please check [email or web address] for unit-specific pharmacist phone numbers

## 2019-06-03 NOTE — Progress Notes (Addendum)
Progress Note  Patient Name: Andre Gallegos Date of Encounter: 06/03/2019  Primary Cardiologist: Dr. Wallie Char  Subjective   He is doing well this morning.  No chest discomfort.  He has not had chest discomfort in over 24 hours.  He is lying flat on his back.  The bed is in Trendelenburg.  He is a candidate for bypass surgery based upon the assessment of Dr. Tharon Aquas Trigt.  Inpatient Medications    Scheduled Meds: . amLODipine  5 mg Oral Daily  . aspirin EC  81 mg Oral Daily  . atorvastatin  80 mg Oral q1800  . carvedilol  12.5 mg Oral BID WC  . Chlorhexidine Gluconate Cloth  6 each Topical Daily  . mouth rinse  15 mL Mouth Rinse BID   Continuous Infusions: . sodium chloride 10 mL/hr at 06/03/19 0700  . sodium chloride    . heparin 1,200 Units/hr (06/03/19 0700)  . nitroGLYCERIN Stopped (06/02/19 0018)   PRN Meds: sodium chloride, acetaminophen, alum & mag hydroxide-simeth, diphenhydrAMINE, nitroGLYCERIN, ondansetron (ZOFRAN) IV, sodium chloride flush   Vital Signs    Vitals:   06/03/19 0600 06/03/19 0700 06/03/19 0758 06/03/19 0800  BP: (!) 165/64 (!) 147/60  (!) 150/59  Pulse: (!) 158 63  72  Resp: 15 16  16   Temp:   98.5 F (36.9 C)   TempSrc:   Oral   SpO2: 95% 96%  95%  Weight:      Height:        Intake/Output Summary (Last 24 hours) at 06/03/2019 0835 Last data filed at 06/03/2019 0700 Gross per 24 hour  Intake 1088.58 ml  Output 2300 ml  Net -1211.42 ml   Last 3 Weights 06/02/2019 06/01/2019 10/31/2018  Weight (lbs) 235 lb 3.7 oz 235 lb 239 lb 11.2 oz  Weight (kg) 106.7 kg 106.595 kg 108.727 kg      Telemetry    Normal sinus rhythm without significant ectopy- Personally Reviewed  ECG    NSR with small inferior Q waves and no acute STTWC. Performed 06/03/2019 - Personally Reviewed  Physical Exam  Moderate obesity GEN: No acute distress.   Neck: No JVD Cardiac: RRR, no murmurs, rubs, or gallops.  Respiratory: Clear to auscultation  bilaterally. GI: Soft, nontender, non-distended  MS: No edema; No deformity. Neuro:  Nonfocal  Psych: Normal affect   Labs    High Sensitivity Troponin:   Recent Labs  Lab 06/01/19 1330 06/01/19 1611  TROPONINIHS 8 2,089*      Chemistry Recent Labs  Lab 06/01/19 2102 06/02/19 0537 06/03/19 0246  NA 136 138 137  K 4.9 4.2 4.4  CL 109 108 108  CO2 19* 19* 21*  GLUCOSE 101* 145* 114*  BUN 23 25* 21  CREATININE 1.46* 1.46* 1.56*  CALCIUM 9.1 8.9 9.0  PROT  --   --  5.9*  ALBUMIN  --   --  3.6  AST  --   --  63*  ALT  --   --  37  ALKPHOS  --   --  52  BILITOT  --   --  1.1  GFRNONAA 49* 49* 45*  GFRAA 57* 57* 52*  ANIONGAP 8 11 8      Hematology Recent Labs  Lab 06/01/19 2102 06/02/19 0706 06/03/19 0246  WBC 13.6* 10.9* 9.8  RBC 4.64 4.40 4.37  HGB 14.8 14.1 13.9  HCT 42.5 41.1 40.2  MCV 91.6 93.4 92.0  MCH 31.9 32.0 31.8  MCHC 34.8 34.3 34.6  RDW 12.7 12.8 12.6  PLT 197 197 162    BNP Recent Labs  Lab 06/01/19 2102  BNP 66.6     DDimer No results for input(s): DDIMER in the last 168 hours.   Radiology    Dg Chest Port 1 View  Result Date: 06/01/2019 CLINICAL DATA:  Acute onset of chest pain radiating to the left shoulder and arm. EXAM: PORTABLE CHEST 1 VIEW COMPARISON:  None. FINDINGS: Borderline enlarged cardiac silhouette. Atherosclerotic plaque within the thoracic aorta. Pacer pads overlie the left ventricular apex. There is mild elevation/eventration of the right hemidiaphragm. Mild diffuse nodular thickening the pulmonary interstitium, potentially accentuated due to portable technique. No discrete focal airspace opacities. No pleural effusion or pneumothorax. No evidence of edema. No acute osseus abnormalities. IMPRESSION: No acute cardiopulmonary disease on this AP portable examination. Electronically Signed   By: Sandi Mariscal M.D.   On: 06/01/2019 14:04    Cardiac Studies   ECHOCARDIOGRAM 2020:  IMPRESSIONS    1. The left ventricle  has mild-moderately reduced systolic function, with an ejection fraction of 40-45%. Akinesis of basal to apical inferior wall.The cavity size was normal. There is mild concentric left ventricular hypertrophy. Left ventricular  diastolic parameters were normal.  2. The right ventricle has normal systolic function. The cavity was normal. There is no increase in right ventricular wall thickness.  3. Small pericardial effusion, primarily adjacent to RV free wall   Patient Profile     68 y.o. male with inferior STEMI (inferior myocardial infarction in 2005 treated with mid to distal stent), hypertension, hyperlipidemia, and attempted emergency PCI at [facility] but failed due to inability to cross with wire due to vessel tortuosity.  At angiography the right coronary was patent but had TIMI grade II flow due to thrombosis in the mid vessel with distal embolization.  Intra-aortic balloon pump led to stabilization, resolution of pain and EKG changes.  Assessment & Plan   1. Acute coronary syndrome with aborted inferior STEMI 2. Acute on chronic kidney disease stage III 3. Hyperkalemia --> resolved 4. Hypertension: Increasing carvedilol dose this morning back to usual 25 mg twice daily dose. 5. Hyperlipidemia   PLAN:  1. Remove Balloon pump later this morning.  Balloon is placed on 2:1 pumping.  If no chest discomfort and hemodynamically stable, will pull the balloon pump prior to noon time. Check P2Y12 inhibition. Discontinued Aggrastat yesterday due to bleeding. Hold heparin while balloon being pulled. IV NTG has been discontinued.  Carvedilol increased to 25 mg twice daily. 2. Increase IV fluid administration due to acute on chronic kidney injury. 3. Previously on ACE inhibitor therapy which was discontinued on admission due to hyperkalemia. 4. Systolic hypertension continues. 5. Continue high intensity statin therapy.  The patient is being followed by Dr. Tharon Aquas Trigt  and will have surgery later this week.  For questions or updates, please contact [place] Please consult [email or web address] for contact info under        Signed, Sinclair Grooms, MD  06/03/2019, 8:36 AM

## 2019-06-03 NOTE — Progress Notes (Signed)
ANTICOAGULATION CONSULT NOTE - Follow Up Consult  Pharmacy Consult for heparin Indication: chest pain/ACS + IABP  No Known Allergies  Patient Measurements: Height: 6' (182.9 cm) Weight: 235 lb 3.7 oz (106.7 kg) IBW/kg (Calculated) : 77.6 Heparin Dosing Weight: 100kg  Vital Signs: Temp: 98.5 F (36.9 C) (09/07 0758) Temp Source: Oral (09/07 0758) BP: 149/84 (09/07 0957) Pulse Rate: 74 (09/07 0908)  Labs: Recent Labs    06/01/19 1330 06/01/19 1611  06/01/19 2102 06/02/19 0537 06/02/19 0706 06/02/19 1303 06/03/19 0246  HGB 15.7  --   --  14.8  --  14.1  --  13.9  HCT 46.9  --   --  42.5  --  41.1  --  40.2  PLT 224  --   --  197  --  197  --  162  APTT 24  --   --   --   --   --   --   --   LABPROT 12.9  --   --   --   --   --   --  13.7  INR 1.0  --   --   --   --   --   --  1.1  HEPARINUNFRC  --   --    < > <0.10* 0.32  --  0.32 0.38  CREATININE 1.87*  --   --  1.46* 1.46*  --   --  1.56*  TROPONINIHS 8 2,089*  --   --   --   --   --   --    < > = values in this interval not displayed.    Estimated Creatinine Clearance: 58 mL/min (A) (by C-G formula based on SCr of 1.56 mg/dL (H)).   Medical History: Past Medical History:  Diagnosis Date  . Arthritis   . CAD (coronary artery disease)   . Hyperlipidemia   . Myocardial infarction [facility]) 2005     Assessment: 50 yoM admitted to [facility] as STEMI with failed PCI. Pt now transferred to [facility] s/p IABP placement with plans for CABG on Wednesday or Thursday after Brilinta washout. Pt transferred on heparin and tirofiban, pharmacy asked to continue. No AC noted PTA.  Heparin drip now paused and RN monitoring ACT's based on protocol for timing to remove IABP.   Dr. Tamala Julian would like to resume heparin after IABP pulled in anticipation for CABG.   Goal of Therapy:  Heparin level 0.3-0.5 units/ml Monitor platelets by anticoagulation protocol: Yes   Plan:  -Continue to hold heparin until IABP pulled -Resume heparin at 1200  units/hr preferably 4 hours after IABP pulled, but can initiate cautiously within 2-4 hours after IABP pull as long as no or minimal bleeding from sheath site (would not recommend initiating heparin within 2 hours of IABP pull)  -Check 6 hour heparin level after drip resumes -Daily heparin level and CBC  -Monitor for bleeding  Vertis Kelch, PharmD PGY2 Cardiology Pharmacy Resident Phone [phone] 06/03/2019       10:12 AM  Please check [email or web address] for unit-specific pharmacist phone numbers

## 2019-06-03 NOTE — Progress Notes (Signed)
ANTICOAGULATION CONSULT NOTE - Follow Up Consult  Pharmacy Consult for heparin Indication: chest pain/ACS  No Known Allergies  Patient Measurements: Height: 6' (182.9 cm) Weight: 235 lb 3.7 oz (106.7 kg) IBW/kg (Calculated) : 77.6 Heparin Dosing Weight: 100kg  Vital Signs: Temp: 98.4 F (36.9 C) (09/07 2000) Temp Source: Oral (09/07 2000) BP: 130/69 (09/07 1800) Pulse Rate: 80 (09/07 1800)  Labs: Recent Labs    06/01/19 1330 06/01/19 1611  06/01/19 2102 06/02/19 0537 06/02/19 0706 06/02/19 1303 06/03/19 0246 06/03/19 1435 06/03/19 2013  HGB 15.7  --   --  14.8  --  14.1  --  13.9  --  14.8  HCT 46.9  --   --  42.5  --  41.1  --  40.2  --  43.1  PLT 224  --   --  197  --  197  --  162  --  167  APTT 24  --   --   --   --   --   --   --   --   --   LABPROT 12.9  --   --   --   --   --   --  13.7  --   --   INR 1.0  --   --   --   --   --   --  1.1  --   --   HEPARINUNFRC  --   --    < > <0.10* 0.32  --  0.32 0.38  --  0.15*  CREATININE 1.87*  --   --  1.46* 1.46*  --   --  1.56*  --   --   TROPONINIHS 8 2,089*  --   --   --   --   --   --  7,033*  --    < > = values in this interval not displayed.    Estimated Creatinine Clearance: 58 mL/min (A) (by C-G formula based on SCr of 1.56 mg/dL (H)).   Medical History: Past Medical History:  Diagnosis Date  . Arthritis   . CAD (coronary artery disease)   . Hyperlipidemia   . Myocardial infarction [facility]) 2005     Assessment: 68 yo M admitted to [facility] as STEMI with failed PCI. Pt now transferred to [facility] s/p IABP placement with plans for CABG on Wednesday or Thursday after Brilinta washout. Pt transferred on heparin and tirofiban (now off). No anticoag noted PTA.  Heparin drip now paused and RN monitoring ACT's based on protocol for timing to remove IABP. IABP removed ~ 1130.  Heparin restarted ~1300 at previously therapeutic rate.  Heparin level this PM subtherapeutic.  Per RN, heparin IV site was infusing poorly when  he assessed the patient at shift change.  New IV site placed and heparin restarted.  This could account for subtherapeutic level given patient was previously therapeutic on this rate.  Will increase slightly.  Goal of Therapy:  Heparin level 0.3-0.7 units/ml Monitor platelets by anticoagulation protocol: Yes   Plan:  -Increase heparin to 1300 units/hr. -Next heparin level with AM labs. -Daily heparin level and CBC  -Monitor for bleeding  [employer], Pharm.D., BCPS Clinical Pharmacist  **Pharmacist phone directory can now be found on [email or web address] ([email or web address]).  Listed under [place].  06/03/2019 9:55 PM

## 2019-06-03 NOTE — Progress Notes (Signed)
Pt. Hold unremarkable,dry sterile dressing applied to rfa site; post hold instructions given to patient: if laugh, cough, sneeze, or bears down must hold pressure to arteriotomy site.Pt. Hand guided to rfa site for appropriate positioning. Pt verbally acknowledged understanding as he repeated instructions back to RNs.  Andre Gallegos and protege)the patient. Pt. Left in stable condition with out complaints of pain, groin level 0.

## 2019-06-04 ENCOUNTER — Inpatient Hospital Stay (HOSPITAL_COMMUNITY): Payer: Medicare Other

## 2019-06-04 ENCOUNTER — Encounter (HOSPITAL_COMMUNITY): Payer: Medicare Other

## 2019-06-04 ENCOUNTER — Encounter: Payer: Self-pay | Admitting: Internal Medicine

## 2019-06-04 ENCOUNTER — Other Ambulatory Visit (HOSPITAL_COMMUNITY): Payer: Self-pay | Admitting: Respiratory Therapy

## 2019-06-04 DIAGNOSIS — I2510 Atherosclerotic heart disease of native coronary artery without angina pectoris: Secondary | ICD-10-CM

## 2019-06-04 DIAGNOSIS — Z01810 Encounter for preprocedural cardiovascular examination: Secondary | ICD-10-CM

## 2019-06-04 LAB — COMPREHENSIVE METABOLIC PANEL
ALT: 39 U/L (ref 0–44)
AST: 38 U/L (ref 15–41)
Albumin: 3.6 g/dL (ref 3.5–5.0)
Alkaline Phosphatase: 53 U/L (ref 38–126)
Anion gap: 8 (ref 5–15)
BUN: 23 mg/dL (ref 8–23)
CO2: 22 mmol/L (ref 22–32)
Calcium: 8.9 mg/dL (ref 8.9–10.3)
Chloride: 107 mmol/L (ref 98–111)
Creatinine, Ser: 1.45 mg/dL — ABNORMAL HIGH (ref 0.61–1.24)
GFR calc Af Amer: 57 mL/min — ABNORMAL LOW (ref >60)
GFR calc non Af Amer: 49 mL/min — ABNORMAL LOW (ref >60)
Glucose, Bld: 113 mg/dL — ABNORMAL HIGH (ref 70–99)
Potassium: 4.3 mmol/L (ref 3.5–5.1)
Sodium: 137 mmol/L (ref 135–145)
Total Bilirubin: 0.8 mg/dL (ref 0.3–1.2)
Total Protein: 6 g/dL — ABNORMAL LOW (ref 6.5–8.1)

## 2019-06-04 LAB — GLUCOSE, CAPILLARY
Glucose-Capillary: 200 mg/dL — ABNORMAL HIGH (ref 70–99)
Glucose-Capillary: 91 mg/dL (ref 70–99)
Glucose-Capillary: 97 mg/dL (ref 70–99)

## 2019-06-04 LAB — PULMONARY FUNCTION TEST
FEF 25-75 Pre: 2.04 L/sec
FEF2575-%Pred-Pre: 72 %
FEV1-%Pred-Pre: 76 %
FEV1-Pre: 2.78 L
FEV1FVC-%Pred-Pre: 100 %
FEV6-%Pred-Pre: 78 %
FEV6-Pre: 3.64 L
FEV6FVC-%Pred-Pre: 103 %
FVC-%Pred-Pre: 76 %
FVC-Pre: 3.71 L
Pre FEV1/FVC ratio: 75 %
Pre FEV6/FVC Ratio: 98 %

## 2019-06-04 LAB — CBC
HCT: 39.1 % (ref 39.0–52.0)
Hemoglobin: 13.4 g/dL (ref 13.0–17.0)
MCH: 31.8 pg (ref 26.0–34.0)
MCHC: 34.3 g/dL (ref 30.0–36.0)
MCV: 92.9 fL (ref 80.0–100.0)
Platelets: 142 10*3/uL — ABNORMAL LOW (ref 150–400)
RBC: 4.21 MIL/uL — ABNORMAL LOW (ref 4.22–5.81)
RDW: 12.5 % (ref 11.5–15.5)
WBC: 9.4 10*3/uL (ref 4.0–10.5)
nRBC: 0 % (ref 0.0–0.2)

## 2019-06-04 LAB — HEPARIN LEVEL (UNFRACTIONATED): Heparin Unfractionated: 0.32 IU/mL (ref 0.30–0.70)

## 2019-06-04 LAB — ABO/RH: ABO/RH(D): A POS

## 2019-06-04 LAB — PLATELET INHIBITION P2Y12: Platelet Function  P2Y12: 227 [PRU] (ref 182–335)

## 2019-06-04 LAB — PREPARE RBC (CROSSMATCH)

## 2019-06-04 MED ORDER — TRANEXAMIC ACID (OHS) BOLUS VIA INFUSION
15.0000 mg/kg | INTRAVENOUS | Status: AC
  Administered 2019-06-05: 1597.5 mg via INTRAVENOUS
  Filled 2019-06-04: qty 1598

## 2019-06-04 MED ORDER — DEXMEDETOMIDINE HCL IN NACL 400 MCG/100ML IV SOLN
0.1000 ug/kg/h | INTRAVENOUS | Status: AC
  Administered 2019-06-05: 11:00:00 .5 ug/kg/h via INTRAVENOUS
  Filled 2019-06-04: qty 100

## 2019-06-04 MED ORDER — EPINEPHRINE HCL 5 MG/250ML IV SOLN IN NS
0.0000 ug/min | INTRAVENOUS | Status: DC
  Filled 2019-06-04: qty 250

## 2019-06-04 MED ORDER — SODIUM CHLORIDE 0.9 % IV SOLN
750.0000 mg | INTRAVENOUS | Status: AC
  Administered 2019-06-05: 750 mg via INTRAVENOUS
  Filled 2019-06-04: qty 750

## 2019-06-04 MED ORDER — TEMAZEPAM 15 MG PO CAPS
15.0000 mg | ORAL_CAPSULE | Freq: Once | ORAL | Status: AC | PRN
  Administered 2019-06-04: 23:00:00 15 mg via ORAL
  Filled 2019-06-04: qty 1

## 2019-06-04 MED ORDER — MILRINONE LACTATE IN DEXTROSE 20-5 MG/100ML-% IV SOLN
0.3000 ug/kg/min | INTRAVENOUS | Status: AC
  Administered 2019-06-05: .25 ug/kg/min via INTRAVENOUS
  Filled 2019-06-04: qty 100

## 2019-06-04 MED ORDER — METOPROLOL TARTRATE 12.5 MG HALF TABLET
12.5000 mg | ORAL_TABLET | Freq: Once | ORAL | Status: AC
  Administered 2019-06-05: 06:00:00 12.5 mg via ORAL
  Filled 2019-06-04: qty 1

## 2019-06-04 MED ORDER — PLASMA-LYTE 148 IV SOLN
INTRAVENOUS | Status: DC
  Filled 2019-06-04: qty 2.5

## 2019-06-04 MED ORDER — POLYETHYLENE GLYCOL 3350 17 G PO PACK
17.0000 g | PACK | Freq: Two times a day (BID) | ORAL | Status: DC
  Administered 2019-06-04 (×2): 17 g via ORAL
  Filled 2019-06-04 (×2): qty 1

## 2019-06-04 MED ORDER — CHLORHEXIDINE GLUCONATE 4 % EX LIQD
60.0000 mL | Freq: Once | CUTANEOUS | Status: AC
  Administered 2019-06-05: 4 via TOPICAL
  Filled 2019-06-04: qty 60

## 2019-06-04 MED ORDER — DOPAMINE-DEXTROSE 3.2-5 MG/ML-% IV SOLN
0.0000 ug/kg/min | INTRAVENOUS | Status: DC
  Filled 2019-06-04: qty 250

## 2019-06-04 MED ORDER — DIAZEPAM 5 MG PO TABS
5.0000 mg | ORAL_TABLET | Freq: Once | ORAL | Status: AC
  Administered 2019-06-05: 06:00:00 5 mg via ORAL
  Filled 2019-06-04: qty 1

## 2019-06-04 MED ORDER — NOREPINEPHRINE 4 MG/250ML-% IV SOLN
0.0000 ug/min | INTRAVENOUS | Status: DC
  Filled 2019-06-04: qty 250

## 2019-06-04 MED ORDER — NITROGLYCERIN IN D5W 200-5 MCG/ML-% IV SOLN
2.0000 ug/min | INTRAVENOUS | Status: DC
  Filled 2019-06-04: qty 250

## 2019-06-04 MED ORDER — SODIUM CHLORIDE 0.9 % IV SOLN
INTRAVENOUS | Status: DC
  Filled 2019-06-04: qty 30

## 2019-06-04 MED ORDER — BISACODYL 5 MG PO TBEC
5.0000 mg | DELAYED_RELEASE_TABLET | Freq: Once | ORAL | Status: AC
  Administered 2019-06-04: 17:00:00 5 mg via ORAL
  Filled 2019-06-04: qty 1

## 2019-06-04 MED ORDER — CHLORHEXIDINE GLUCONATE 4 % EX LIQD
60.0000 mL | Freq: Once | CUTANEOUS | Status: AC
  Administered 2019-06-04: 4 via TOPICAL
  Filled 2019-06-04: qty 60

## 2019-06-04 MED ORDER — TRANEXAMIC ACID 1000 MG/10ML IV SOLN
1.5000 mg/kg/h | INTRAVENOUS | Status: AC
  Administered 2019-06-05: 1.5 mg/kg/h via INTRAVENOUS
  Filled 2019-06-04: qty 25

## 2019-06-04 MED ORDER — POTASSIUM CHLORIDE 2 MEQ/ML IV SOLN
80.0000 meq | INTRAVENOUS | Status: DC
  Filled 2019-06-04: qty 40

## 2019-06-04 MED ORDER — INSULIN REGULAR(HUMAN) IN NACL 100-0.9 UT/100ML-% IV SOLN
INTRAVENOUS | Status: AC
  Administered 2019-06-05: 10:00:00 1.1 [IU]/h via INTRAVENOUS
  Filled 2019-06-04: qty 100

## 2019-06-04 MED ORDER — TRANEXAMIC ACID (OHS) PUMP PRIME SOLUTION
2.0000 mg/kg | INTRAVENOUS | Status: DC
  Filled 2019-06-04: qty 2.13

## 2019-06-04 MED ORDER — VANCOMYCIN HCL 10 G IV SOLR
1500.0000 mg | INTRAVENOUS | Status: AC
  Administered 2019-06-05: 09:00:00 1500 mg via INTRAVENOUS
  Filled 2019-06-04: qty 1500

## 2019-06-04 MED ORDER — CHLORHEXIDINE GLUCONATE 0.12 % MT SOLN
15.0000 mL | Freq: Once | OROMUCOSAL | Status: AC
  Administered 2019-06-05: 05:00:00 15 mL via OROMUCOSAL
  Filled 2019-06-04: qty 15

## 2019-06-04 MED ORDER — MAGNESIUM SULFATE 50 % IJ SOLN
40.0000 meq | INTRAMUSCULAR | Status: DC
  Filled 2019-06-04: qty 9.85

## 2019-06-04 MED ORDER — SODIUM CHLORIDE 0.9 % IV SOLN
1.5000 g | INTRAVENOUS | Status: AC
  Administered 2019-06-05: 1.5 g via INTRAVENOUS
  Filled 2019-06-04: qty 1.5

## 2019-06-04 MED ORDER — PHENYLEPHRINE HCL-NACL 20-0.9 MG/250ML-% IV SOLN
30.0000 ug/min | INTRAVENOUS | Status: AC
  Administered 2019-06-05: 20 ug/min via INTRAVENOUS
  Filled 2019-06-04: qty 250

## 2019-06-04 NOTE — Progress Notes (Signed)
Pt sitting up in recliner, looks good. Discussed sternal precautions, IS (+2500 mL), mobility post op, and d/c planning. Very receptive, appropriate questions. Gave educational materials for him to review today. His wife can be with him at d/c.  [place] CES, ACSM 9:23 AM 06/04/2019

## 2019-06-04 NOTE — Progress Notes (Signed)
Procedure(s) (LRB): CORONARY ARTERY BYPASS GRAFTING (CABG) (N/A) TRANSESOPHAGEAL ECHOCARDIOGRAM (TEE) (N/A) Subjective: Plan CABG in am for previous MI, severe CAD of RCA with unsuccessful PCI attempt at [facility] Platelet fx appears to be adequate for surgery after Brilinta dose 3 days ago  Objective: Vital signs in last 24 hours: Temp:  [97.8 F (36.6 C)-98.4 F (36.9 C)] 98.3 F (36.8 C) (09/08 1553) Pulse Rate:  [41-86] 70 (09/08 1500) Cardiac Rhythm: Normal sinus rhythm (09/08 1200) Resp:  [13-23] 13 (09/08 1500) BP: (118-145)/(68-98) 139/86 (09/08 1500) SpO2:  [92 %-98 %] 98 % (09/08 1500) Weight:  [106.5 kg] 106.5 kg (09/08 0530)  Hemodynamic parameters for last 24 hours:  nsr  Intake/Output from previous day: 09/07 0701 - 09/08 0700 In: 2427 [P.O.:600; I.V.:1827] Out: 2625 [Urine:2625] Intake/Output this shift: Total I/O In: 1003.8 [P.O.:300; I.V.:703.8] Out: 550 [Urine:550]       Exam    General- alert and comfortable    Neck- no JVD, no cervical adenopathy palpable, no carotid bruit   Lungs- clear without rales, wheezes   Cor- regular rate and rhythm, no murmur , gallop   Abdomen- soft, non-tender   Extremities - warm, non-tender, minimal edema   Neuro- oriented, appropriate, no focal weakness   Lab Results: Recent Labs    06/03/19 2013 06/04/19 0233  WBC 11.2* 9.4  HGB 14.8 13.4  HCT 43.1 39.1  PLT 167 142*   BMET:  Recent Labs    06/03/19 0246 06/04/19 0233  NA 137 137  K 4.4 4.3  CL 108 107  CO2 21* 22  GLUCOSE 114* 113*  BUN 21 23  CREATININE 1.56* 1.45*  CALCIUM 9.0 8.9    PT/INR:  Recent Labs    06/03/19 0246  LABPROT 13.7  INR 1.1   ABG No results found for: PHART, HCO3, TCO2, ACIDBASEDEF, O2SAT CBG (last 3)  Recent Labs    06/04/19 0755 06/04/19 1138 06/04/19 1555  GLUCAP 200* 91 97    Assessment/Plan: S/P Procedure(s) (LRB): CORONARY ARTERY BYPASS GRAFTING (CABG) (N/A) TRANSESOPHAGEAL ECHOCARDIOGRAM (TEE)  (N/A)  CABG in am DC heparin on call to OR  LOS: 3 days    Andre Gallegos 06/04/2019

## 2019-06-04 NOTE — Research (Signed)
[facility] Research study protocol presented to patient. Questions encouraged and answered. Left ICF and pamphlet left for patient to review. Returned several hours later and patient declined to participate. I thanked him for allowing me to speak with him and wished him well with his upcoming surgery.

## 2019-06-04 NOTE — Progress Notes (Signed)
Pre-CABG Dopplers completed. Refer to "CV Proc" under chart review to view preliminary results.  06/04/2019 10:54 AM Maudry Mayhew, MHA, RVT, RDCS, RDMS

## 2019-06-04 NOTE — Progress Notes (Signed)
ANTICOAGULATION CONSULT NOTE - Follow Up Consult  Pharmacy Consult for heparin Indication: chest pain/ACS  No Known Allergies  Patient Measurements: Height: 6' (182.9 cm) Weight: 234 lb 12.6 oz (106.5 kg) IBW/kg (Calculated) : 77.6 Heparin Dosing Weight: 100kg  Vital Signs: Temp: 97.8 F (36.6 C) (09/08 1150) Temp Source: Oral (09/08 1150) BP: 138/85 (09/08 0917) Pulse Rate: 76 (09/08 0747)  Labs: Recent Labs    06/01/19 1330 06/01/19 1611  06/02/19 0537  06/03/19 0246 06/03/19 1435 06/03/19 2013 06/04/19 0233  HGB 15.7  --    < >  --    < > 13.9  --  14.8 13.4  HCT 46.9  --    < >  --    < > 40.2  --  43.1 39.1  PLT 224  --    < >  --    < > 162  --  167 142*  APTT 24  --   --   --   --   --   --   --   --   LABPROT 12.9  --   --   --   --  13.7  --   --   --   INR 1.0  --   --   --   --  1.1  --   --   --   HEPARINUNFRC  --   --    < > 0.32   < > 0.38  --  0.15* 0.32  CREATININE 1.87*  --    < > 1.46*  --  1.56*  --   --  1.45*  TROPONINIHS 8 2,089*  --   --   --   --  7,033*  --   --    < > = values in this interval not displayed.    Estimated Creatinine Clearance: 62.4 mL/min (A) (by C-G formula based on SCr of 1.45 mg/dL (H)).   Medical History: Past Medical History:  Diagnosis Date  . Arthritis   . CAD (coronary artery disease)   . Hyperlipidemia   . Myocardial infarction [facility]) 2005     Assessment: 68 yo M admitted to [facility] as STEMI with failed PCI. Pt now transferred to [facility] s/p IABP placement (now removed) with plans for CABG on Wednesday or Thursday after Brilinta washout. Pt transferred on heparin and tirofiban (now off). No anticoag noted PTA.  Heparin level this AM is on the lower end of the therapeutic range, however the level was drawn only 5 hours after heparin was restarted. No issues with infusion or bleeding noted per RN. Hg/Hct is normal and stable. Plts are slightly low but stable.  Goal of Therapy:  Heparin level 0.3-0.7  units/ml Monitor platelets by anticoagulation protocol: Yes   Plan:  -Continue heparin at 1300 units/hr to keep pt in therapeutic range -Next heparin level with AM labs. -Daily heparin level and CBC  -Monitor for bleeding  **Pharmacist phone directory can now be found on [email or web address] ([email or web address]).  Listed under [place].  Sherren Kerns, PharmD PGY1 Acute Care Pharmacy Resident [phone] 06/04/2019 12:32 PM

## 2019-06-04 NOTE — Progress Notes (Signed)
Progress Note  Patient Name: Andre Gallegos Date of Encounter: 06/04/2019  Primary Cardiologist: Dr. Wallie Char  Subjective   No chest pain or dyspnea this am.   Inpatient Medications    Scheduled Meds:  amLODipine  5 mg Oral Daily   aspirin EC  81 mg Oral Daily   atorvastatin  80 mg Oral q1800   carvedilol  25 mg Oral BID WC   Chlorhexidine Gluconate Cloth  6 each Topical Daily   mouth rinse  15 mL Mouth Rinse BID   Continuous Infusions:  sodium chloride 75 mL/hr at 06/04/19 0600   sodium chloride     heparin 1,300 Units/hr (06/04/19 0600)   nitroGLYCERIN Stopped (06/02/19 0018)   PRN Meds: sodium chloride, acetaminophen, alum & mag hydroxide-simeth, diphenhydrAMINE, nitroGLYCERIN, ondansetron (ZOFRAN) IV, sodium chloride flush   Vital Signs    Vitals:   06/04/19 0600 06/04/19 0700 06/04/19 0747 06/04/19 0754  BP: 129/69  124/76   Pulse: 69 73 76   Resp: 16 (!) 21    Temp:   98.3 F (36.8 C) 98.3 F (36.8 C)  TempSrc:   Oral   SpO2: 97% 98%    Weight:      Height:        Intake/Output Summary (Last 24 hours) at 06/04/2019 0805 Last data filed at 06/04/2019 0700 Gross per 24 hour  Intake 2339 ml  Output 2625 ml  Net -286 ml   Last 3 Weights 06/04/2019 06/02/2019 06/01/2019  Weight (lbs) 234 lb 12.6 oz 235 lb 3.7 oz 235 lb  Weight (kg) 106.5 kg 106.7 kg 106.595 kg      Telemetry    Sinus - Personally Reviewed  ECG    No AM EKG - Personally Reviewed  Physical Exam   General: Well developed, well nourished, NAD  HEENT: OP clear, mucus membranes moist  SKIN: warm, dry. No rashes. Neuro: No focal deficits  Musculoskeletal: Muscle strength 5/5 all ext  Psychiatric: Mood and affect normal  Neck: No JVD, no carotid bruits, no thyromegaly, no lymphadenopathy.  Lungs:Clear bilaterally, no wheezes, rhonci, crackles Cardiovascular: Regular rate and rhythm. No murmurs, gallops or rubs. Abdomen:Soft. Bowel sounds present. Non-tender.    Extremities: No lower extremity edema. Pulses are 2 + in the bilateral DP/PT.   Labs    High Sensitivity Troponin:   Recent Labs  Lab 06/01/19 1330 06/01/19 1611 06/03/19 1435  TROPONINIHS 8 2,089* 7,033*      Chemistry Recent Labs  Lab 06/02/19 0537 06/03/19 0246 06/04/19 0233  NA 138 137 137  K 4.2 4.4 4.3  CL 108 108 107  CO2 19* 21* 22  GLUCOSE 145* 114* 113*  BUN 25* 21 23  CREATININE 1.46* 1.56* 1.45*  CALCIUM 8.9 9.0 8.9  PROT  --  5.9* 6.0*  ALBUMIN  --  3.6 3.6  AST  --  63* 38  ALT  --  37 39  ALKPHOS  --  52 53  BILITOT  --  1.1 0.8  GFRNONAA 49* 45* 49*  GFRAA 57* 52* 57*  ANIONGAP 11 8 8      Hematology Recent Labs  Lab 06/03/19 0246 06/03/19 2013 06/04/19 0233  WBC 9.8 11.2* 9.4  RBC 4.37 4.68 4.21*  HGB 13.9 14.8 13.4  HCT 40.2 43.1 39.1  MCV 92.0 92.1 92.9  MCH 31.8 31.6 31.8  MCHC 34.6 34.3 34.3  RDW 12.6 12.6 12.5  PLT 162 167 142*    BNP Recent Labs  Lab  06/01/19 2102  BNP 66.6     DDimer No results for input(s): DDIMER in the last 168 hours.   Radiology    Dg Chest Port 1 View  Result Date: 06/04/2019 CLINICAL DATA:  Preop for coronary artery bypass graft. EXAM: PORTABLE CHEST 1 VIEW COMPARISON:  Radiograph of June 03, 2019. FINDINGS: The heart size and mediastinal contours are within normal limits. Both lungs are clear. Intra-aortic balloon is not clearly visualized currently. Atherosclerosis of thoracic aorta is noted. The visualized skeletal structures are unremarkable. IMPRESSION: No active disease. Aortic Atherosclerosis (ICD10-I70.0). Electronically Signed   By: Marijo Conception M.D.   On: 06/04/2019 07:13   Dg Chest Port 1 View  Result Date: 06/03/2019 CLINICAL DATA:  Pt complained of recent chest pain - due to have open heart surgery this week EXAM: PORTABLE CHEST 1 VIEW COMPARISON:  06/01/2019 FINDINGS: Heart size is normal. Stable elevation of the RIGHT hemidiaphragm. Intra-aortic balloon pump marker projects  over the superior aortic arch. Improved aeration of the lungs. No pulmonary edema. No new consolidation. IMPRESSION: Improved aeration. Interval placement of intra aortic balloon pump. Electronically Signed   By: Nolon Nations M.D.   On: 06/03/2019 09:18    Cardiac Studies   ECHOCARDIOGRAM 2020:  1. The left ventricle has mild-moderately reduced systolic function, with an ejection fraction of 40-45%. Akinesis of basal to apical inferior wall.The cavity size was normal. There is mild concentric left ventricular hypertrophy. Left ventricular  diastolic parameters were normal.  2. The right ventricle has normal systolic function. The cavity was normal. There is no increase in right ventricular wall thickness.  3. Small pericardial effusion, primarily adjacent to RV free wall   Patient Profile     68 y.o. male with inferior STEMI (inferior myocardial infarction in 2005 treated with mid to distal stent), hypertension, hyperlipidemia, and attempted emergency PCI at [facility] but failed due to inability to cross with wire due to vessel tortuosity.  At angiography the right coronary was patent but had TIMI grade II flow due to thrombosis in the mid vessel with distal embolization.  Intra-aortic balloon pump led to stabilization, resolution of pain and EKG changes.  Assessment & Plan   1. CAD/Aborted inferior STEMI: He is having no chest pain this am. Awaiting CABG tomorrow. Continue ASA, statin and beta blocker.   2. HTN: BP is well controlled.   3. Chronic kidney disease, stage 3: Renal function stable Ace-inh on hold due to acute worsening of renal function.   For questions or updates, please contact [place] Please consult [email or web address] for contact info under        Signed, Lauree Chandler, MD  06/04/2019, 8:05 AM

## 2019-06-04 NOTE — Anesthesia Preprocedure Evaluation (Addendum)
Anesthesia Evaluation  Patient identified by MRN, date of birth, ID band Patient awake    Reviewed: Allergy & Precautions, NPO status , Patient's Chart, lab work & pertinent test results, reviewed documented beta blocker date and time   History of Anesthesia Complications Negative for: history of anesthetic complications  Airway Mallampati: II  TM Distance: >3 FB Neck ROM: Full    Dental  (+) Dental Advisory Given   Pulmonary neg pulmonary ROS, former smoker,    Pulmonary exam normal        Cardiovascular hypertension, Pt. on medications and Pt. on home beta blockers + CAD, + Past MI and + Cardiac Stents  Normal cardiovascular exam   '20 Carotid US - 1-39% b/l ICAS  '20 TTE - EF 40-45%. Mild concentric left ventricular hypertrophy. Definity contrast agent was given IV to delineate the left ventricular endocardial borders. A small pericardial effusion is present.   '20 Cath - Prox RCA lesion is 50% stenosed. Mid RCA lesion is 99% stenosed. RPDA lesion is 99% stenosed. RPAV lesion is 95% stenosed. Prox LAD lesion is 60% stenosed. 1st Diag lesion is 90% stenosed. Ost Cx to Prox Cx lesion is 50% stenosed.    Neuro/Psych negative neurological ROS  negative psych ROS   GI/Hepatic negative GI ROS, Neg liver ROS,   Endo/Other   Obesity   Renal/GU CRFRenal disease     Musculoskeletal  (+) Arthritis ,   Abdominal   Peds  Hematology  Thrombocytopenia    Anesthesia Other Findings   Reproductive/Obstetrics                            Anesthesia Physical Anesthesia Plan  ASA: IV  Anesthesia Plan: General   Post-op Pain Management:    Induction: Intravenous  PONV Risk Score and Plan: 2 and Treatment may vary due to age or medical condition  Airway Management Planned: Oral ETT  Additional Equipment: Arterial line, CVP, PA Cath, TEE and Ultrasound Guidance Line  Placement  Intra-op Plan:   Post-operative Plan: Post-operative intubation/ventilation  Informed Consent: I have reviewed the patients History and Physical, chart, labs and discussed the procedure including the risks, benefits and alternatives for the proposed anesthesia with the patient or authorized representative who has indicated his/her understanding and acceptance.     Dental advisory given  Plan Discussed with: CRNA and Anesthesiologist  Anesthesia Plan Comments:        Anesthesia Quick Evaluation

## 2019-06-05 ENCOUNTER — Inpatient Hospital Stay (HOSPITAL_COMMUNITY): Payer: Medicare Other | Admitting: Anesthesiology

## 2019-06-05 ENCOUNTER — Encounter (HOSPITAL_COMMUNITY): Payer: Self-pay | Admitting: Certified Registered Nurse Anesthetist

## 2019-06-05 ENCOUNTER — Inpatient Hospital Stay (HOSPITAL_COMMUNITY): Payer: Medicare Other

## 2019-06-05 ENCOUNTER — Inpatient Hospital Stay (HOSPITAL_COMMUNITY)
Disposition: A | Discharge status: Home / Self Care | Payer: Self-pay | Source: Other Acute Inpatient Hospital | Attending: Cardiothoracic Surgery

## 2019-06-05 DIAGNOSIS — I25110 Atherosclerotic heart disease of native coronary artery with unstable angina pectoris: Secondary | ICD-10-CM

## 2019-06-05 DIAGNOSIS — R072 Precordial pain: Secondary | ICD-10-CM

## 2019-06-05 DIAGNOSIS — Z951 Presence of aortocoronary bypass graft: Secondary | ICD-10-CM

## 2019-06-05 HISTORY — PX: TEE WITHOUT CARDIOVERSION: SHX5443

## 2019-06-05 HISTORY — PX: CORONARY ARTERY BYPASS GRAFT: SHX141

## 2019-06-05 LAB — BASIC METABOLIC PANEL
Anion gap: 7 (ref 5–15)
Anion gap: 7 (ref 5–15)
BUN: 20 mg/dL (ref 8–23)
BUN: 16 mg/dL (ref 8–23)
CO2: 22 mmol/L (ref 22–32)
CO2: 22 mmol/L (ref 22–32)
Calcium: 8.8 mg/dL — ABNORMAL LOW (ref 8.9–10.3)
Calcium: 8.4 mg/dL — ABNORMAL LOW (ref 8.9–10.3)
Chloride: 108 mmol/L (ref 98–111)
Chloride: 108 mmol/L (ref 98–111)
Creatinine, Ser: 1.35 mg/dL — ABNORMAL HIGH (ref 0.61–1.24)
Creatinine, Ser: 1.35 mg/dL — ABNORMAL HIGH (ref 0.61–1.24)
GFR calc Af Amer: >60 mL/min (ref >60)
GFR calc Af Amer: >60 mL/min (ref >60)
GFR calc non Af Amer: 54 mL/min — ABNORMAL LOW (ref >60)
GFR calc non Af Amer: 54 mL/min — ABNORMAL LOW (ref >60)
Glucose, Bld: 114 mg/dL — ABNORMAL HIGH (ref 70–99)
Glucose, Bld: 143 mg/dL — ABNORMAL HIGH (ref 70–99)
Potassium: 4.4 mmol/L (ref 3.5–5.1)
Potassium: 4.7 mmol/L (ref 3.5–5.1)
Sodium: 137 mmol/L (ref 135–145)
Sodium: 137 mmol/L (ref 135–145)

## 2019-06-05 LAB — POCT I-STAT 7, (LYTES, BLD GAS, ICA,H+H)
Acid-Base Excess: 1 mmol/L (ref 0.0–2.0)
Acid-base deficit: 3 mmol/L — ABNORMAL HIGH (ref 0.0–2.0)
Acid-base deficit: 4 mmol/L — ABNORMAL HIGH (ref 0.0–2.0)
Acid-base deficit: 5 mmol/L — ABNORMAL HIGH (ref 0.0–2.0)
Acid-base deficit: 2 mmol/L (ref 0.0–2.0)
Bicarbonate: 26.9 mmol/L (ref 20.0–28.0)
Bicarbonate: 22.6 mmol/L (ref 20.0–28.0)
Bicarbonate: 22 mmol/L (ref 20.0–28.0)
Bicarbonate: 20.4 mmol/L (ref 20.0–28.0)
Bicarbonate: 22.1 mmol/L (ref 20.0–28.0)
Calcium, Ion: 1.06 mmol/L — ABNORMAL LOW (ref 1.15–1.40)
Calcium, Ion: 1.46 mmol/L — ABNORMAL HIGH (ref 1.15–1.40)
Calcium, Ion: 1.27 mmol/L (ref 1.15–1.40)
Calcium, Ion: 1.26 mmol/L (ref 1.15–1.40)
Calcium, Ion: 1.22 mmol/L (ref 1.15–1.40)
HCT: 28 % — ABNORMAL LOW (ref 39.0–52.0)
HCT: 25 % — ABNORMAL LOW (ref 39.0–52.0)
HCT: 27 % — ABNORMAL LOW (ref 39.0–52.0)
HCT: 27 % — ABNORMAL LOW (ref 39.0–52.0)
HCT: 27 % — ABNORMAL LOW (ref 39.0–52.0)
Hemoglobin: 9.5 g/dL — ABNORMAL LOW (ref 13.0–17.0)
Hemoglobin: 8.5 g/dL — ABNORMAL LOW (ref 13.0–17.0)
Hemoglobin: 9.2 g/dL — ABNORMAL LOW (ref 13.0–17.0)
Hemoglobin: 9.2 g/dL — ABNORMAL LOW (ref 13.0–17.0)
Hemoglobin: 9.2 g/dL — ABNORMAL LOW (ref 13.0–17.0)
O2 Saturation: 100 %
O2 Saturation: 99 %
O2 Saturation: 96 %
O2 Saturation: 94 %
O2 Saturation: 93 %
Patient temperature: 35.8
Patient temperature: 37.2
Patient temperature: 37.9
Potassium: 5 mmol/L (ref 3.5–5.1)
Potassium: 4.8 mmol/L (ref 3.5–5.1)
Potassium: 4.2 mmol/L (ref 3.5–5.1)
Potassium: 4.7 mmol/L (ref 3.5–5.1)
Potassium: 4.7 mmol/L (ref 3.5–5.1)
Sodium: 140 mmol/L (ref 135–145)
Sodium: 139 mmol/L (ref 135–145)
Sodium: 142 mmol/L (ref 135–145)
Sodium: 139 mmol/L (ref 135–145)
Sodium: 139 mmol/L (ref 135–145)
TCO2: 28 mmol/L (ref 22–32)
TCO2: 24 mmol/L (ref 22–32)
TCO2: 23 mmol/L (ref 22–32)
TCO2: 22 mmol/L (ref 22–32)
TCO2: 23 mmol/L (ref 22–32)
pCO2 arterial: 49.8 mmHg — ABNORMAL HIGH (ref 32.0–48.0)
pCO2 arterial: 44.2 mmHg (ref 32.0–48.0)
pCO2 arterial: 39.8 mmHg (ref 32.0–48.0)
pCO2 arterial: 37.5 mmHg (ref 32.0–48.0)
pCO2 arterial: 37.7 mmHg (ref 32.0–48.0)
pH, Arterial: 7.341 — ABNORMAL LOW (ref 7.350–7.450)
pH, Arterial: 7.316 — ABNORMAL LOW (ref 7.350–7.450)
pH, Arterial: 7.345 — ABNORMAL LOW (ref 7.350–7.450)
pH, Arterial: 7.345 — ABNORMAL LOW (ref 7.350–7.450)
pH, Arterial: 7.381 (ref 7.350–7.450)
pO2, Arterial: 411 mmHg — ABNORMAL HIGH (ref 83.0–108.0)
pO2, Arterial: 139 mmHg — ABNORMAL HIGH (ref 83.0–108.0)
pO2, Arterial: 82 mmHg — ABNORMAL LOW (ref 83.0–108.0)
pO2, Arterial: 75 mmHg — ABNORMAL LOW (ref 83.0–108.0)
pO2, Arterial: 71 mmHg — ABNORMAL LOW (ref 83.0–108.0)

## 2019-06-05 LAB — GLUCOSE, CAPILLARY
Glucose-Capillary: 116 mg/dL — ABNORMAL HIGH (ref 70–99)
Glucose-Capillary: 113 mg/dL — ABNORMAL HIGH (ref 70–99)
Glucose-Capillary: 126 mg/dL — ABNORMAL HIGH (ref 70–99)
Glucose-Capillary: 121 mg/dL — ABNORMAL HIGH (ref 70–99)
Glucose-Capillary: 134 mg/dL — ABNORMAL HIGH (ref 70–99)
Glucose-Capillary: 146 mg/dL — ABNORMAL HIGH (ref 70–99)
Glucose-Capillary: 171 mg/dL — ABNORMAL HIGH (ref 70–99)
Glucose-Capillary: 150 mg/dL — ABNORMAL HIGH (ref 70–99)

## 2019-06-05 LAB — PREPARE RBC (CROSSMATCH)

## 2019-06-05 LAB — POCT I-STAT 4, (NA,K, GLUC, HGB,HCT)
Glucose, Bld: 115 mg/dL — ABNORMAL HIGH (ref 70–99)
Glucose, Bld: 111 mg/dL — ABNORMAL HIGH (ref 70–99)
Glucose, Bld: 97 mg/dL (ref 70–99)
Glucose, Bld: 128 mg/dL — ABNORMAL HIGH (ref 70–99)
Glucose, Bld: 144 mg/dL — ABNORMAL HIGH (ref 70–99)
Glucose, Bld: 137 mg/dL — ABNORMAL HIGH (ref 70–99)
Glucose, Bld: 184 mg/dL — ABNORMAL HIGH (ref 70–99)
Glucose, Bld: 95 mg/dL (ref 70–99)
HCT: 34 % — ABNORMAL LOW (ref 39.0–52.0)
HCT: 30 % — ABNORMAL LOW (ref 39.0–52.0)
HCT: 34 % — ABNORMAL LOW (ref 39.0–52.0)
HCT: 26 % — ABNORMAL LOW (ref 39.0–52.0)
HCT: 27 % — ABNORMAL LOW (ref 39.0–52.0)
HCT: 24 % — ABNORMAL LOW (ref 39.0–52.0)
HCT: 27 % — ABNORMAL LOW (ref 39.0–52.0)
HCT: 27 % — ABNORMAL LOW (ref 39.0–52.0)
Hemoglobin: 11.6 g/dL — ABNORMAL LOW (ref 13.0–17.0)
Hemoglobin: 10.2 g/dL — ABNORMAL LOW (ref 13.0–17.0)
Hemoglobin: 11.6 g/dL — ABNORMAL LOW (ref 13.0–17.0)
Hemoglobin: 8.8 g/dL — ABNORMAL LOW (ref 13.0–17.0)
Hemoglobin: 9.2 g/dL — ABNORMAL LOW (ref 13.0–17.0)
Hemoglobin: 8.2 g/dL — ABNORMAL LOW (ref 13.0–17.0)
Hemoglobin: 9.2 g/dL — ABNORMAL LOW (ref 13.0–17.0)
Hemoglobin: 9.2 g/dL — ABNORMAL LOW (ref 13.0–17.0)
Potassium: 4.8 mmol/L (ref 3.5–5.1)
Potassium: 4.9 mmol/L (ref 3.5–5.1)
Potassium: 5 mmol/L (ref 3.5–5.1)
Potassium: 6.1 mmol/L — ABNORMAL HIGH (ref 3.5–5.1)
Potassium: 6.9 mmol/L (ref 3.5–5.1)
Potassium: 4.8 mmol/L (ref 3.5–5.1)
Potassium: 6.4 mmol/L (ref 3.5–5.1)
Potassium: 4.3 mmol/L (ref 3.5–5.1)
Sodium: 138 mmol/L (ref 135–145)
Sodium: 139 mmol/L (ref 135–145)
Sodium: 140 mmol/L (ref 135–145)
Sodium: 133 mmol/L — ABNORMAL LOW (ref 135–145)
Sodium: 134 mmol/L — ABNORMAL LOW (ref 135–145)
Sodium: 132 mmol/L — ABNORMAL LOW (ref 135–145)
Sodium: 137 mmol/L (ref 135–145)
Sodium: 139 mmol/L (ref 135–145)

## 2019-06-05 LAB — CBC
HCT: 37.2 % — ABNORMAL LOW (ref 39.0–52.0)
HCT: 38.2 % — ABNORMAL LOW (ref 39.0–52.0)
HCT: 28.6 % — ABNORMAL LOW (ref 39.0–52.0)
HCT: 28.6 % — ABNORMAL LOW (ref 39.0–52.0)
Hemoglobin: 13.2 g/dL (ref 13.0–17.0)
Hemoglobin: 13.3 g/dL (ref 13.0–17.0)
Hemoglobin: 9.7 g/dL — ABNORMAL LOW (ref 13.0–17.0)
Hemoglobin: 10.1 g/dL — ABNORMAL LOW (ref 13.0–17.0)
MCH: 32 pg (ref 26.0–34.0)
MCH: 32 pg (ref 26.0–34.0)
MCH: 31.7 pg (ref 26.0–34.0)
MCH: 32.3 pg (ref 26.0–34.0)
MCHC: 35.5 g/dL (ref 30.0–36.0)
MCHC: 34.8 g/dL (ref 30.0–36.0)
MCHC: 33.9 g/dL (ref 30.0–36.0)
MCHC: 35.3 g/dL (ref 30.0–36.0)
MCV: 90.1 fL (ref 80.0–100.0)
MCV: 92 fL (ref 80.0–100.0)
MCV: 93.5 fL (ref 80.0–100.0)
MCV: 91.4 fL (ref 80.0–100.0)
Platelets: 144 10*3/uL — ABNORMAL LOW (ref 150–400)
Platelets: 163 10*3/uL (ref 150–400)
Platelets: 110 10*3/uL — ABNORMAL LOW (ref 150–400)
Platelets: 115 10*3/uL — ABNORMAL LOW (ref 150–400)
RBC: 4.13 MIL/uL — ABNORMAL LOW (ref 4.22–5.81)
RBC: 4.15 MIL/uL — ABNORMAL LOW (ref 4.22–5.81)
RBC: 3.06 MIL/uL — ABNORMAL LOW (ref 4.22–5.81)
RBC: 3.13 MIL/uL — ABNORMAL LOW (ref 4.22–5.81)
RDW: 12.4 % (ref 11.5–15.5)
RDW: 12.5 % (ref 11.5–15.5)
RDW: 12.4 % (ref 11.5–15.5)
RDW: 12.5 % (ref 11.5–15.5)
WBC: 9.4 10*3/uL (ref 4.0–10.5)
WBC: 10.6 10*3/uL — ABNORMAL HIGH (ref 4.0–10.5)
WBC: 15.3 10*3/uL — ABNORMAL HIGH (ref 4.0–10.5)
WBC: 12.2 10*3/uL — ABNORMAL HIGH (ref 4.0–10.5)
nRBC: 0 % (ref 0.0–0.2)
nRBC: 0 % (ref 0.0–0.2)
nRBC: 0 % (ref 0.0–0.2)
nRBC: 0 % (ref 0.0–0.2)

## 2019-06-05 LAB — MAGNESIUM: Magnesium: 2.7 mg/dL — ABNORMAL HIGH (ref 1.7–2.4)

## 2019-06-05 LAB — HEPARIN LEVEL (UNFRACTIONATED): Heparin Unfractionated: 0.39 IU/mL (ref 0.30–0.70)

## 2019-06-05 LAB — ECHO INTRAOPERATIVE TEE
Height: 72 in
Weight: 3756.64 oz

## 2019-06-05 LAB — PROTIME-INR
INR: 1.4 — ABNORMAL HIGH (ref 0.8–1.2)
Prothrombin Time: 17.1 seconds — ABNORMAL HIGH (ref 11.4–15.2)

## 2019-06-05 LAB — APTT: aPTT: 27 seconds (ref 24–36)

## 2019-06-05 LAB — HEMOGLOBIN AND HEMATOCRIT, BLOOD
HCT: 27.4 % — ABNORMAL LOW (ref 39.0–52.0)
Hemoglobin: 9.4 g/dL — ABNORMAL LOW (ref 13.0–17.0)

## 2019-06-05 LAB — PLATELET COUNT: Platelets: 125 10*3/uL — ABNORMAL LOW (ref 150–400)

## 2019-06-05 SURGERY — CORONARY ARTERY BYPASS GRAFTING (CABG)
Anesthesia: General | Site: Chest

## 2019-06-05 MED ORDER — LACTATED RINGERS IV SOLN: 500.0000 mL | Freq: Once | INTRAVENOUS | Status: DC | PRN

## 2019-06-05 MED ORDER — DEXMEDETOMIDINE HCL IN NACL 200 MCG/50ML IV SOLN
0.0000 ug/kg/h | INTRAVENOUS | Status: DC
  Administered 2019-06-05: 0.7 ug/kg/h via INTRAVENOUS
  Administered 2019-06-06: 0.2 ug/kg/h via INTRAVENOUS
  Filled 2019-06-05 (×2): qty 50

## 2019-06-05 MED ORDER — ORAL CARE MOUTH RINSE
15.0000 mL | OROMUCOSAL | Status: DC
  Administered 2019-06-05: 18:00:00 15 mL via OROMUCOSAL

## 2019-06-05 MED ORDER — MIDAZOLAM HCL 2 MG/2ML IJ SOLN: 2.0000 mg | INTRAMUSCULAR | Status: DC | PRN

## 2019-06-05 MED ORDER — PHENYLEPHRINE HCL-NACL 20-0.9 MG/250ML-% IV SOLN: 0.0000 ug/min | INTRAVENOUS | Status: DC

## 2019-06-05 MED ORDER — MAGNESIUM SULFATE 4 GM/100ML IV SOLN
4.0000 g | Freq: Once | INTRAVENOUS | Status: AC
  Administered 2019-06-05: 4 g via INTRAVENOUS
  Filled 2019-06-05: qty 100

## 2019-06-05 MED ORDER — ASPIRIN EC 325 MG PO TBEC
325.0000 mg | DELAYED_RELEASE_TABLET | Freq: Every day | ORAL | Status: DC
  Administered 2019-06-06 – 2019-06-09 (×4): 325 mg via ORAL
  Filled 2019-06-05 (×4): qty 1

## 2019-06-05 MED ORDER — CALCIUM CHLORIDE 10 % IV SOLN
INTRAVENOUS | Status: DC | PRN
  Administered 2019-06-05 (×3): 200 mg via INTRAVENOUS
  Administered 2019-06-05: 300 mg via INTRAVENOUS
  Administered 2019-06-05: 100 mg via INTRAVENOUS
  Administered 2019-06-05: 200 mg via INTRAVENOUS

## 2019-06-05 MED ORDER — LACTATED RINGERS IV SOLN
INTRAVENOUS | Status: DC | PRN
  Administered 2019-06-05: 08:00:00 via INTRAVENOUS

## 2019-06-05 MED ORDER — PHENYLEPHRINE 40 MCG/ML (10ML) SYRINGE FOR IV PUSH (FOR BLOOD PRESSURE SUPPORT)
PREFILLED_SYRINGE | INTRAVENOUS | Status: AC
  Filled 2019-06-05: qty 10

## 2019-06-05 MED ORDER — ROCURONIUM BROMIDE 10 MG/ML (PF) SYRINGE
PREFILLED_SYRINGE | INTRAVENOUS | Status: AC
  Filled 2019-06-05: qty 30

## 2019-06-05 MED ORDER — POTASSIUM CHLORIDE 10 MEQ/50ML IV SOLN: 10.0000 meq | INTRAVENOUS | Status: AC

## 2019-06-05 MED ORDER — FENTANYL CITRATE (PF) 250 MCG/5ML IJ SOLN
INTRAMUSCULAR | Status: DC | PRN
  Administered 2019-06-05: 100 ug via INTRAVENOUS
  Administered 2019-06-05: 50 ug via INTRAVENOUS
  Administered 2019-06-05: 100 ug via INTRAVENOUS
  Administered 2019-06-05: 50 ug via INTRAVENOUS
  Administered 2019-06-05: 100 ug via INTRAVENOUS
  Administered 2019-06-05: 150 ug via INTRAVENOUS
  Administered 2019-06-05: 50 ug via INTRAVENOUS
  Administered 2019-06-05: 150 ug via INTRAVENOUS
  Administered 2019-06-05: 50 ug via INTRAVENOUS
  Administered 2019-06-05: 150 ug via INTRAVENOUS
  Administered 2019-06-05: 200 ug via INTRAVENOUS
  Administered 2019-06-05: 50 ug via INTRAVENOUS

## 2019-06-05 MED ORDER — ACETAMINOPHEN 500 MG PO TABS
1000.0000 mg | ORAL_TABLET | Freq: Four times a day (QID) | ORAL | Status: AC
  Administered 2019-06-06 – 2019-06-10 (×17): 1000 mg via ORAL
  Filled 2019-06-05 (×17): qty 2

## 2019-06-05 MED ORDER — LACTATED RINGERS IV SOLN: INTRAVENOUS | Status: DC

## 2019-06-05 MED ORDER — FUROSEMIDE 10 MG/ML IJ SOLN
INTRAMUSCULAR | Status: AC
  Filled 2019-06-05: qty 4

## 2019-06-05 MED ORDER — SODIUM CHLORIDE 0.9 % IV SOLN
INTRAVENOUS | Status: DC
  Administered 2019-06-05: 15:00:00 via INTRAVENOUS

## 2019-06-05 MED ORDER — FENTANYL CITRATE (PF) 250 MCG/5ML IJ SOLN
INTRAMUSCULAR | Status: AC
  Filled 2019-06-05: qty 25

## 2019-06-05 MED ORDER — SODIUM CHLORIDE 0.9% FLUSH: 3.0000 mL | INTRAVENOUS | Status: DC | PRN

## 2019-06-05 MED ORDER — SODIUM CHLORIDE (PF) 0.9 % IJ SOLN
INTRAMUSCULAR | Status: AC
  Filled 2019-06-05: qty 10

## 2019-06-05 MED ORDER — MIDAZOLAM HCL (PF) 10 MG/2ML IJ SOLN
INTRAMUSCULAR | Status: AC
  Filled 2019-06-05: qty 2

## 2019-06-05 MED ORDER — ACETAMINOPHEN 160 MG/5ML PO SOLN: 650.0000 mg | Freq: Once | ORAL | Status: AC

## 2019-06-05 MED ORDER — INSULIN REGULAR(HUMAN) IN NACL 100-0.9 UT/100ML-% IV SOLN
INTRAVENOUS | Status: DC
  Filled 2019-06-05: qty 100

## 2019-06-05 MED ORDER — PROTAMINE SULFATE 10 MG/ML IV SOLN
INTRAVENOUS | Status: AC
  Filled 2019-06-05: qty 10

## 2019-06-05 MED ORDER — MILRINONE LACTATE IN DEXTROSE 20-5 MG/100ML-% IV SOLN: 0.2500 ug/kg/min | INTRAVENOUS | Status: AC

## 2019-06-05 MED ORDER — DEXTROSE 50 % IV SOLN
INTRAVENOUS | Status: DC | PRN
  Administered 2019-06-05: 12.5 g via INTRAVENOUS

## 2019-06-05 MED ORDER — SODIUM CHLORIDE 0.9 % IV SOLN
INTRAVENOUS | Status: DC | PRN
  Administered 2019-06-05: 14:00:00 via INTRAVENOUS

## 2019-06-05 MED ORDER — ONDANSETRON HCL 4 MG/2ML IJ SOLN
4.0000 mg | Freq: Four times a day (QID) | INTRAMUSCULAR | Status: DC | PRN
  Administered 2019-06-06 – 2019-06-07 (×3): 4 mg via INTRAVENOUS
  Filled 2019-06-05 (×4): qty 2

## 2019-06-05 MED ORDER — CHLORHEXIDINE GLUCONATE 0.12 % MT SOLN
15.0000 mL | OROMUCOSAL | Status: AC
  Administered 2019-06-05: 15:00:00 15 mL via OROMUCOSAL

## 2019-06-05 MED ORDER — PROTAMINE SULFATE 10 MG/ML IV SOLN
INTRAVENOUS | Status: DC | PRN
  Administered 2019-06-05: 30 mg via INTRAVENOUS
  Administered 2019-06-05: 350 mg via INTRAVENOUS

## 2019-06-05 MED ORDER — OXYCODONE HCL 5 MG PO TABS
5.0000 mg | ORAL_TABLET | ORAL | Status: DC | PRN
  Administered 2019-06-05: 21:00:00 5 mg via ORAL
  Administered 2019-06-06 (×3): 10 mg via ORAL
  Administered 2019-06-07 – 2019-06-11 (×7): 5 mg via ORAL
  Filled 2019-06-05: qty 2
  Filled 2019-06-05 (×3): qty 1
  Filled 2019-06-05: qty 2
  Filled 2019-06-05 (×5): qty 1
  Filled 2019-06-05: qty 2

## 2019-06-05 MED ORDER — SODIUM CHLORIDE 0.9% FLUSH: 10.0000 mL | INTRAVENOUS | Status: DC | PRN

## 2019-06-05 MED ORDER — ALBUMIN HUMAN 5 % IV SOLN
INTRAVENOUS | Status: DC | PRN
  Administered 2019-06-05 (×2): via INTRAVENOUS

## 2019-06-05 MED ORDER — INSULIN REGULAR BOLUS VIA INFUSION
0.0000 [IU] | Freq: Three times a day (TID) | INTRAVENOUS | Status: DC
  Filled 2019-06-05: qty 10

## 2019-06-05 MED ORDER — PROPOFOL 10 MG/ML IV BOLUS
INTRAVENOUS | Status: AC
  Filled 2019-06-05: qty 20

## 2019-06-05 MED ORDER — METOPROLOL TARTRATE 12.5 MG HALF TABLET
12.5000 mg | ORAL_TABLET | Freq: Two times a day (BID) | ORAL | Status: DC
  Administered 2019-06-06 (×2): 12.5 mg via ORAL
  Filled 2019-06-05 (×3): qty 1

## 2019-06-05 MED ORDER — SODIUM CHLORIDE 0.9 % IV SOLN
1.5000 g | Freq: Two times a day (BID) | INTRAVENOUS | Status: AC
  Administered 2019-06-05 – 2019-06-07 (×4): 1.5 g via INTRAVENOUS
  Filled 2019-06-05 (×4): qty 1.5

## 2019-06-05 MED ORDER — CHLORHEXIDINE GLUCONATE 0.12% ORAL RINSE (MEDLINE KIT)
15.0000 mL | Freq: Two times a day (BID) | OROMUCOSAL | Status: DC
  Administered 2019-06-05: 20:00:00 15 mL via OROMUCOSAL

## 2019-06-05 MED ORDER — PLASMA-LYTE 148 IV SOLN
INTRAVENOUS | Status: DC | PRN
  Administered 2019-06-05: 500 mL via INTRAVASCULAR

## 2019-06-05 MED ORDER — HEPARIN SODIUM (PORCINE) 1000 UNIT/ML IJ SOLN
INTRAMUSCULAR | Status: AC
  Filled 2019-06-05: qty 1

## 2019-06-05 MED ORDER — BISACODYL 5 MG PO TBEC
10.0000 mg | DELAYED_RELEASE_TABLET | Freq: Every day | ORAL | Status: DC
  Administered 2019-06-06 – 2019-06-10 (×4): 10 mg via ORAL
  Filled 2019-06-05 (×6): qty 2

## 2019-06-05 MED ORDER — ACETAMINOPHEN 650 MG RE SUPP
650.0000 mg | Freq: Once | RECTAL | Status: AC
  Administered 2019-06-05: 15:00:00 650 mg via RECTAL

## 2019-06-05 MED ORDER — METOPROLOL TARTRATE 25 MG/10 ML ORAL SUSPENSION: 12.5000 mg | Freq: Two times a day (BID) | ORAL | Status: DC

## 2019-06-05 MED ORDER — 0.9 % SODIUM CHLORIDE (POUR BTL) OPTIME
TOPICAL | Status: DC | PRN
  Administered 2019-06-05: 5000 mL

## 2019-06-05 MED ORDER — NITROGLYCERIN IN D5W 200-5 MCG/ML-% IV SOLN: 0.0000 ug/min | INTRAVENOUS | Status: DC

## 2019-06-05 MED ORDER — ALBUTEROL SULFATE HFA 108 (90 BASE) MCG/ACT IN AERS
INHALATION_SPRAY | RESPIRATORY_TRACT | Status: DC | PRN
  Administered 2019-06-05: 8 via RESPIRATORY_TRACT

## 2019-06-05 MED ORDER — BISACODYL 10 MG RE SUPP
10.0000 mg | Freq: Every day | RECTAL | Status: DC
  Filled 2019-06-05: qty 1

## 2019-06-05 MED ORDER — CHLORHEXIDINE GLUCONATE CLOTH 2 % EX PADS
6.0000 | MEDICATED_PAD | Freq: Every day | CUTANEOUS | Status: DC
  Administered 2019-06-05 – 2019-06-07 (×3): 6 via TOPICAL

## 2019-06-05 MED ORDER — DOCUSATE SODIUM 100 MG PO CAPS
200.0000 mg | ORAL_CAPSULE | Freq: Every day | ORAL | Status: DC
  Administered 2019-06-06 – 2019-06-10 (×5): 200 mg via ORAL
  Filled 2019-06-05 (×6): qty 2

## 2019-06-05 MED ORDER — HEPARIN SODIUM (PORCINE) 1000 UNIT/ML IJ SOLN
INTRAMUSCULAR | Status: DC | PRN
  Administered 2019-06-05: 36000 [IU] via INTRAVENOUS
  Administered 2019-06-05: 2000 [IU] via INTRAVENOUS

## 2019-06-05 MED ORDER — PROPOFOL 10 MG/ML IV BOLUS
INTRAVENOUS | Status: DC | PRN
  Administered 2019-06-05: 50 mg via INTRAVENOUS
  Administered 2019-06-05: 30 mg via INTRAVENOUS
  Administered 2019-06-05 (×2): 20 mg via INTRAVENOUS

## 2019-06-05 MED ORDER — PROTAMINE SULFATE 10 MG/ML IV SOLN
INTRAVENOUS | Status: AC
  Filled 2019-06-05: qty 25

## 2019-06-05 MED ORDER — ROCURONIUM BROMIDE 10 MG/ML (PF) SYRINGE
PREFILLED_SYRINGE | INTRAVENOUS | Status: DC | PRN
  Administered 2019-06-05: 100 mg via INTRAVENOUS
  Administered 2019-06-05: 50 mg via INTRAVENOUS
  Administered 2019-06-05 (×2): 30 mg via INTRAVENOUS
  Administered 2019-06-05: 70 mg via INTRAVENOUS

## 2019-06-05 MED ORDER — SODIUM CHLORIDE 0.9% FLUSH
3.0000 mL | Freq: Two times a day (BID) | INTRAVENOUS | Status: DC
  Administered 2019-06-06 – 2019-06-10 (×5): 3 mL via INTRAVENOUS

## 2019-06-05 MED ORDER — SODIUM BICARBONATE 8.4 % IV SOLN
50.0000 meq | Freq: Once | INTRAVENOUS | Status: AC
  Administered 2019-06-05: 19:00:00 50 meq via INTRAVENOUS

## 2019-06-05 MED ORDER — MORPHINE SULFATE (PF) 2 MG/ML IV SOLN
1.0000 mg | INTRAVENOUS | Status: DC | PRN
  Administered 2019-06-05 – 2019-06-06 (×5): 2 mg via INTRAVENOUS
  Filled 2019-06-05: qty 1
  Filled 2019-06-05: qty 2
  Filled 2019-06-05 (×2): qty 1

## 2019-06-05 MED ORDER — ACETAMINOPHEN 160 MG/5ML PO SOLN
1000.0000 mg | Freq: Four times a day (QID) | ORAL | Status: AC
  Filled 2019-06-05: qty 40.6

## 2019-06-05 MED ORDER — INSULIN ASPART 100 UNIT/ML ~~LOC~~ SOLN
SUBCUTANEOUS | Status: DC | PRN
  Administered 2019-06-05: 10 [IU] via INTRAVENOUS
  Administered 2019-06-05: 10 [IU] via SUBCUTANEOUS

## 2019-06-05 MED ORDER — VANCOMYCIN HCL IN DEXTROSE 1-5 GM/200ML-% IV SOLN
1000.0000 mg | Freq: Once | INTRAVENOUS | Status: AC
  Administered 2019-06-05: 20:00:00 1000 mg via INTRAVENOUS
  Filled 2019-06-05: qty 200

## 2019-06-05 MED ORDER — HEMOSTATIC AGENTS (NO CHARGE) OPTIME
TOPICAL | Status: DC | PRN
  Administered 2019-06-05 (×2): 1 via TOPICAL

## 2019-06-05 MED ORDER — SODIUM BICARBONATE 8.4 % IV SOLN
INTRAVENOUS | Status: DC | PRN
  Administered 2019-06-05: 25 meq via INTRAVENOUS

## 2019-06-05 MED ORDER — TRAMADOL HCL 50 MG PO TABS
50.0000 mg | ORAL_TABLET | ORAL | Status: DC | PRN
  Administered 2019-06-05 – 2019-06-07 (×3): 100 mg via ORAL
  Filled 2019-06-05 (×3): qty 2

## 2019-06-05 MED ORDER — ALBUMIN HUMAN 5 % IV SOLN
250.0000 mL | INTRAVENOUS | Status: AC | PRN
  Administered 2019-06-05 (×2): 12.5 g via INTRAVENOUS

## 2019-06-05 MED ORDER — ASPIRIN 81 MG PO CHEW
324.0000 mg | CHEWABLE_TABLET | Freq: Every day | ORAL | Status: DC
  Administered 2019-06-08: 09:00:00 324 mg
  Filled 2019-06-05: qty 4

## 2019-06-05 MED ORDER — METOPROLOL TARTRATE 5 MG/5ML IV SOLN
2.5000 mg | INTRAVENOUS | Status: DC | PRN
  Administered 2019-06-06: 17:00:00 2.5 mg via INTRAVENOUS
  Administered 2019-06-07: 5 mg via INTRAVENOUS
  Filled 2019-06-05: qty 5

## 2019-06-05 MED ORDER — MILRINONE LACTATE IN DEXTROSE 20-5 MG/100ML-% IV SOLN
0.1250 ug/kg/min | INTRAVENOUS | Status: DC
  Administered 2019-06-05: 0.25 ug/kg/min via INTRAVENOUS
  Administered 2019-06-06: 12:00:00 0.125 ug/kg/min via INTRAVENOUS
  Filled 2019-06-05 (×2): qty 100

## 2019-06-05 MED ORDER — SODIUM CHLORIDE (PF) 0.9 % IJ SOLN
OROMUCOSAL | Status: DC | PRN
  Administered 2019-06-05 (×3): 4 mL via TOPICAL

## 2019-06-05 MED ORDER — SODIUM CHLORIDE 0.9 % IV SOLN
20.0000 ug | Freq: Once | INTRAVENOUS | Status: AC
  Administered 2019-06-05: 20 ug via INTRAVENOUS
  Filled 2019-06-05: qty 5

## 2019-06-05 MED ORDER — PANTOPRAZOLE SODIUM 40 MG PO TBEC
40.0000 mg | DELAYED_RELEASE_TABLET | Freq: Every day | ORAL | Status: DC
  Administered 2019-06-07 – 2019-06-11 (×5): 40 mg via ORAL
  Filled 2019-06-05 (×5): qty 1

## 2019-06-05 MED ORDER — PHENYLEPHRINE 40 MCG/ML (10ML) SYRINGE FOR IV PUSH (FOR BLOOD PRESSURE SUPPORT)
PREFILLED_SYRINGE | INTRAVENOUS | Status: DC | PRN
  Administered 2019-06-05 (×4): 80 ug via INTRAVENOUS

## 2019-06-05 MED ORDER — SODIUM CHLORIDE 0.9 % IV SOLN: 250.0000 mL | INTRAVENOUS | Status: DC

## 2019-06-05 MED ORDER — MIDAZOLAM HCL 2 MG/2ML IJ SOLN
INTRAMUSCULAR | Status: DC | PRN
  Administered 2019-06-05: 3 mg via INTRAVENOUS
  Administered 2019-06-05: 2 mg via INTRAVENOUS
  Administered 2019-06-05 (×2): 1 mg via INTRAVENOUS
  Administered 2019-06-05: 3 mg via INTRAVENOUS

## 2019-06-05 MED ORDER — SODIUM CHLORIDE 0.9% FLUSH
10.0000 mL | Freq: Two times a day (BID) | INTRAVENOUS | Status: DC
  Administered 2019-06-05 – 2019-06-06 (×2): 10 mL
  Administered 2019-06-06: 30 mL
  Administered 2019-06-07 – 2019-06-10 (×4): 10 mL

## 2019-06-05 MED ORDER — FAMOTIDINE IN NACL 20-0.9 MG/50ML-% IV SOLN
20.0000 mg | Freq: Two times a day (BID) | INTRAVENOUS | Status: AC
  Administered 2019-06-05: 15:00:00 20 mg via INTRAVENOUS
  Filled 2019-06-05: qty 50

## 2019-06-05 MED ORDER — SODIUM CHLORIDE 0.45 % IV SOLN
INTRAVENOUS | Status: DC | PRN
  Administered 2019-06-05: 15:00:00 via INTRAVENOUS

## 2019-06-05 SURGICAL SUPPLY — 102 items
ADAPTER CARDIO PERF ANTE/RETRO (ADAPTER) ×4 IMPLANT
ADPR PRFSN 84XANTGRD RTRGD (ADAPTER) ×2
AGENT HMST KT MTR STRL THRMB (HEMOSTASIS) ×2
BAG DECANTER FOR FLEXI CONT (MISCELLANEOUS) ×4 IMPLANT
BASKET HEART  (ORDER IN 25'S) (MISCELLANEOUS) ×1
BASKET HEART (ORDER IN 25'S) (MISCELLANEOUS) ×1
BASKET HEART (ORDER IN 25S) (MISCELLANEOUS) ×2 IMPLANT
BLADE CLIPPER SURG (BLADE) ×4 IMPLANT
BLADE STERNUM SYSTEM 6 (BLADE) ×4 IMPLANT
BLADE SURG 12 STRL SS (BLADE) ×4 IMPLANT
BNDG ELASTIC 4X5.8 VLCR STR LF (GAUZE/BANDAGES/DRESSINGS) ×4 IMPLANT
BNDG ELASTIC 6X5.8 VLCR STR LF (GAUZE/BANDAGES/DRESSINGS) ×4 IMPLANT
BNDG GAUZE ELAST 4 BULKY (GAUZE/BANDAGES/DRESSINGS) ×4 IMPLANT
CANISTER SUCT 3000ML PPV (MISCELLANEOUS) ×4 IMPLANT
CANNULA ARTERIAL NVNT 3/8 22FR (MISCELLANEOUS) ×2 IMPLANT
CANNULA GUNDRY RCSP 15FR (MISCELLANEOUS) ×4 IMPLANT
CATH CPB KIT VANTRIGT (MISCELLANEOUS) ×4 IMPLANT
CATH ROBINSON RED A/P 18FR (CATHETERS) ×12 IMPLANT
CATH THORACIC 36FR RT ANG (CATHETERS) ×4 IMPLANT
COVER WAND RF STERILE (DRAPES) ×2 IMPLANT
DRAIN CHANNEL 32F RND 10.7 FF (WOUND CARE) ×4 IMPLANT
DRAPE CARDIOVASCULAR INCISE (DRAPES) ×4
DRAPE SLUSH/WARMER DISC (DRAPES) ×4 IMPLANT
DRAPE SRG 135X102X78XABS (DRAPES) ×2 IMPLANT
DRSG AQUACEL AG ADV 3.5X14 (GAUZE/BANDAGES/DRESSINGS) ×4 IMPLANT
ELECT BLADE 4.0 EZ CLEAN MEGAD (MISCELLANEOUS) ×4
ELECT BLADE 6.5 EXT (BLADE) ×4 IMPLANT
ELECT CAUTERY BLADE 6.4 (BLADE) ×4 IMPLANT
ELECT REM PT RETURN 9FT ADLT (ELECTROSURGICAL) ×8
ELECTRODE BLDE 4.0 EZ CLN MEGD (MISCELLANEOUS) ×2 IMPLANT
ELECTRODE REM PT RTRN 9FT ADLT (ELECTROSURGICAL) ×4 IMPLANT
FELT TEFLON 1X6 (MISCELLANEOUS) ×6 IMPLANT
GAUZE SPONGE 4X4 12PLY STRL (GAUZE/BANDAGES/DRESSINGS) ×8 IMPLANT
GAUZE SPONGE 4X4 12PLY STRL LF (GAUZE/BANDAGES/DRESSINGS) ×4 IMPLANT
GLOVE BIO SURGEON STRL SZ 6 (GLOVE) ×6 IMPLANT
GLOVE BIO SURGEON STRL SZ 6.5 (GLOVE) ×3 IMPLANT
GLOVE BIO SURGEON STRL SZ7.5 (GLOVE) ×14 IMPLANT
GLOVE BIO SURGEONS STRL SZ 6.5 (GLOVE) ×3
GLOVE BIOGEL PI IND STRL 6 (GLOVE) IMPLANT
GLOVE BIOGEL PI IND STRL 6.5 (GLOVE) IMPLANT
GLOVE BIOGEL PI INDICATOR 6 (GLOVE) ×6
GLOVE BIOGEL PI INDICATOR 6.5 (GLOVE) ×8
GOWN STRL REUS W/ TWL LRG LVL3 (GOWN DISPOSABLE) ×8 IMPLANT
GOWN STRL REUS W/TWL LRG LVL3 (GOWN DISPOSABLE) ×20
HEMOSTAT POWDER SURGIFOAM 1G (HEMOSTASIS) ×12 IMPLANT
HEMOSTAT SURGICEL 2X14 (HEMOSTASIS) ×4 IMPLANT
INSERT FOGARTY XLG (MISCELLANEOUS) IMPLANT
KIT BASIN OR (CUSTOM PROCEDURE TRAY) ×4 IMPLANT
KIT SUCTION CATH 14FR (SUCTIONS) ×4 IMPLANT
KIT TURNOVER KIT B (KITS) ×4 IMPLANT
KIT VASOVIEW HEMOPRO 2 VH 4000 (KITS) ×4 IMPLANT
LEAD PACING MYOCARDI (MISCELLANEOUS) ×4 IMPLANT
MARKER GRAFT CORONARY BYPASS (MISCELLANEOUS) ×12 IMPLANT
NS IRRIG 1000ML POUR BTL (IV SOLUTION) ×20 IMPLANT
PACK E OPEN HEART (SUTURE) ×4 IMPLANT
PACK OPEN HEART (CUSTOM PROCEDURE TRAY) ×4 IMPLANT
PAD ARMBOARD 7.5X6 YLW CONV (MISCELLANEOUS) ×4 IMPLANT
PAD ELECT DEFIB RADIOL ZOLL (MISCELLANEOUS) ×4 IMPLANT
PENCIL BUTTON HOLSTER BLD 10FT (ELECTRODE) ×4 IMPLANT
POSITIONER HEAD DONUT 9IN (MISCELLANEOUS) ×4 IMPLANT
PUNCH AORTIC ROTATE  4.5MM 8IN (MISCELLANEOUS) ×2 IMPLANT
PUNCH AORTIC ROTATE 4.0MM (MISCELLANEOUS) IMPLANT
PUNCH AORTIC ROTATE 4.5MM 8IN (MISCELLANEOUS) IMPLANT
PUNCH AORTIC ROTATE 5MM 8IN (MISCELLANEOUS) IMPLANT
SET CARDIOPLEGIA MPS 5001102 (MISCELLANEOUS) ×2 IMPLANT
SPONGE LAP 4X18 RFD (DISPOSABLE) ×2 IMPLANT
SURGIFLO W/THROMBIN 8M KIT (HEMOSTASIS) ×4 IMPLANT
SUT BONE WAX W31G (SUTURE) ×4 IMPLANT
SUT MNCRL AB 4-0 PS2 18 (SUTURE) IMPLANT
SUT PROLENE 3 0 SH DA (SUTURE) ×4 IMPLANT
SUT PROLENE 3 0 SH1 36 (SUTURE) IMPLANT
SUT PROLENE 4 0 RB 1 (SUTURE) ×4
SUT PROLENE 4 0 SH DA (SUTURE) ×4 IMPLANT
SUT PROLENE 4-0 RB1 .5 CRCL 36 (SUTURE) ×2 IMPLANT
SUT PROLENE 5 0 C 1 36 (SUTURE) IMPLANT
SUT PROLENE 6 0 C 1 30 (SUTURE) ×4 IMPLANT
SUT PROLENE 6 0 CC (SUTURE) ×12 IMPLANT
SUT PROLENE 8 0 BV175 6 (SUTURE) ×2 IMPLANT
SUT PROLENE BLUE 7 0 (SUTURE) ×6 IMPLANT
SUT SILK  1 MH (SUTURE)
SUT SILK 1 MH (SUTURE) IMPLANT
SUT SILK 2 0 SH CR/8 (SUTURE) ×2 IMPLANT
SUT SILK 3 0 SH CR/8 (SUTURE) IMPLANT
SUT STEEL 6MS V (SUTURE) ×8 IMPLANT
SUT STEEL STERNAL CCS#1 18IN (SUTURE) ×2 IMPLANT
SUT STEEL SZ 6 DBL 3X14 BALL (SUTURE) ×6 IMPLANT
SUT VIC AB 1 CTX 36 (SUTURE) ×12
SUT VIC AB 1 CTX36XBRD ANBCTR (SUTURE) ×4 IMPLANT
SUT VIC AB 2-0 CT1 27 (SUTURE) ×4
SUT VIC AB 2-0 CT1 TAPERPNT 27 (SUTURE) IMPLANT
SUT VIC AB 2-0 CTX 27 (SUTURE) IMPLANT
SUT VIC AB 3-0 X1 27 (SUTURE) IMPLANT
SUT VICRYL 3 0 (SUTURE) ×2 IMPLANT
SYSTEM SAHARA CHEST DRAIN ATS (WOUND CARE) ×4 IMPLANT
TAPE CLOTH SURG 4X10 WHT LF (GAUZE/BANDAGES/DRESSINGS) ×2 IMPLANT
TAPE PAPER 2X10 WHT MICROPORE (GAUZE/BANDAGES/DRESSINGS) ×2 IMPLANT
TOWEL GREEN STERILE (TOWEL DISPOSABLE) ×4 IMPLANT
TOWEL GREEN STERILE FF (TOWEL DISPOSABLE) ×4 IMPLANT
TRAY FOLEY SLVR 16FR TEMP STAT (SET/KITS/TRAYS/PACK) ×4 IMPLANT
TUBING LAP HI FLOW INSUFFLATIO (TUBING) ×4 IMPLANT
UNDERPAD 30X30 (UNDERPADS AND DIAPERS) ×4 IMPLANT
WATER STERILE IRR 1000ML POUR (IV SOLUTION) ×8 IMPLANT

## 2019-06-05 NOTE — Anesthesia Procedure Notes (Signed)
Central Venous Catheter Insertion Performed by: Audry Pili, MD, anesthesiologist Start/End9/05/2019 8:07 AM, 06/05/2019 8:19 AM Patient location: Pre-op. Preanesthetic checklist: patient identified, IV checked, risks and benefits discussed, surgical consent, monitors and equipment checked, pre-op evaluation, timeout performed and anesthesia consent Position: Trendelenburg Lidocaine 1% used for infiltration and patient sedated Hand hygiene performed , maximum sterile barriers used  and Seldinger technique used Catheter size: 8.5 Fr Central line was placed.MAC introducer Procedure performed using ultrasound guided technique. Ultrasound Notes:anatomy identified, needle tip was noted to be adjacent to the nerve/plexus identified, no ultrasound evidence of intravascular and/or intraneural injection and image(s) printed for medical record Attempts: 1 Following insertion, line sutured, dressing applied and Biopatch. Post procedure assessment: blood return through all ports, no air and free fluid flow  Patient tolerated the procedure well with no immediate complications.

## 2019-06-05 NOTE — Progress Notes (Signed)
Echocardiogram Echocardiogram Transesophageal has been performed.  Andre Gallegos [name] 06/05/2019, 2:20 PM

## 2019-06-05 NOTE — Anesthesia Procedure Notes (Signed)
Procedure Name: Intubation Date/Time: 06/05/2019 9:00 AM Performed by: Elayne Snare, CRNA Pre-anesthesia Checklist: Patient identified, Emergency Drugs available, Suction available and Patient being monitored Patient Re-evaluated:Patient Re-evaluated prior to induction Oxygen Delivery Method: Circle System Utilized Preoxygenation: Pre-oxygenation with 100% oxygen Induction Type: IV induction Ventilation: Mask ventilation without difficulty and Oral airway inserted - appropriate to patient size Laryngoscope Size: Mac and 4 Grade View: Grade I Tube type: Oral Tube size: 8.0 mm Number of attempts: 1 Airway Equipment and Method: Stylet and Oral airway Placement Confirmation: ETT inserted through vocal cords under direct vision,  positive ETCO2 and breath sounds checked- equal and bilateral Secured at: 23 cm Tube secured with: Tape Dental Injury: Teeth and Oropharynx as per pre-operative assessment

## 2019-06-05 NOTE — Anesthesia Procedure Notes (Signed)
Central Venous Catheter Insertion Performed by: Audry Pili, MD, anesthesiologist Start/End9/05/2019 8:17 AM, 06/05/2019 8:19 AM Patient location: Pre-op. Preanesthetic checklist: patient identified, IV checked, risks and benefits discussed, surgical consent, monitors and equipment checked, pre-op evaluation, timeout performed and anesthesia consent Position: Trendelenburg Hand hygiene performed  and maximum sterile barriers used  Total catheter length 10. PA cath was placed.Swan type:thermodilution PA Cath depth:50 Procedure performed without using ultrasound guided technique. Attempts: 1 Patient tolerated the procedure well with no immediate complications.

## 2019-06-05 NOTE — Transfer of Care (Signed)
Immediate Anesthesia Transfer of Care Note  Patient: Andre Gallegos  Procedure(s) Performed: CORONARY ARTERY BYPASS GRAFTING (CABG) x 3 WITH ENDOSCOPIC HARVESTING OF RIGHT GREATER SAPHENOUS VEIN. LIMA TO LAD. (N/A Chest) TRANSESOPHAGEAL ECHOCARDIOGRAM (TEE) (N/A )  Patient Location: ICU  Anesthesia Type:General  Level of Consciousness: Patient remains intubated per anesthesia plan  Airway & Oxygen Therapy: Patient remains intubated per anesthesia plan and Patient placed on Ventilator (see vital sign flow sheet for setting)  Post-op Assessment: Report given to RN and Post -op Vital signs reviewed and stable  Post vital signs: Reviewed and stable  Last Vitals:  Vitals Value Taken Time  BP    Temp 35.9 C 06/05/19 1429  Pulse 89 06/05/19 1429  Resp 14 06/05/19 1429  SpO2 98 % 06/05/19 1429  Vitals shown include unvalidated device data.  Last Pain:  Vitals:   06/05/19 0400  TempSrc:   PainSc: 0-No pain      Patients Stated Pain Goal: 0 ([id number])  Complications: No apparent anesthesia complications

## 2019-06-05 NOTE — Progress Notes (Signed)
Dr. Roxy Manns notified of post wean ABG results. Stated to give an amp of bicarb and proceed with extubation.  Joellen Jersey, RN

## 2019-06-05 NOTE — Brief Op Note (Signed)
06/01/2019 - 06/05/2019  12:16 PM  PATIENT:  Andre Gallegos  68 y.o. male  PRE-OPERATIVE DIAGNOSIS:  CAD  POST-OPERATIVE DIAGNOSIS:  CAD  PROCEDURE:  Procedure(s): CORONARY ARTERY BYPASS GRAFTING (CABG) x 3 WITH ENDOSCOPIC HARVESTING OF RIGHT GREATER SAPHENOUS VEIN. LIMA TO LAD. (N/A) TRANSESOPHAGEAL ECHOCARDIOGRAM (TEE) (N/A) SVG-DIAG SVG-PD LIMA-LAD   SURGEON:  Surgeon(s) and Role:    Ivin Poot, MD - Primary  PHYSICIAN ASSISTANT: WAYNE GOLD PA-C  ANESTHESIA:   general  EBL:  460ml  BLOOD ADMINISTERED:none  DRAINS: PLEURAL AND PERICARDIAL CHEST TUBES   LOCAL MEDICATIONS USED:  NONE  SPECIMEN:  No Specimen  DISPOSITION OF SPECIMEN:  N/A  COUNTS:  YES  TOURNIQUET:  * No tourniquets in log *  DICTATION: .Other Dictation: Dictation Number PENDING  PLAN OF CARE: Admit to inpatient   PATIENT DISPOSITION:  ICU - intubated and hemodynamically stable.   Delay start of Pharmacological VTE agent (>24hrs) due to surgical blood loss or risk of bleeding: yes  COMPLICATIONS: NO KNOWN

## 2019-06-05 NOTE — Procedures (Signed)
Extubation Procedure Note  Patient Details:   Name: GARRIN EPPS DOB: June 21, 1951 MRN: [id number]   Airway Documentation:    Vent end date: 06/05/19 Vent end time: 1850   Evaluation  O2 sats: stable throughout Complications: No apparent complications Patient did tolerate procedure well. Bilateral Breath Sounds: Clear   Yes   Pt was extubated to 3L [place] at 1850. Pt NIF was -25 and VC was 1000. Pt was suctioned and had a positive cuff leak prior to extubation. Pt was able to say his name and date of birth after extubation. Pt saturations are 95-96%. RT will continue to monitor.    Ahijah Devery A Gilad Dugger 06/05/2019, 6:57 PM

## 2019-06-05 NOTE — Progress Notes (Signed)
[facility] BRIEF SICU PROGRESS NOTE  Day of Surgery  S/P Procedure(s) (LRB): CORONARY ARTERY BYPASS GRAFTING (CABG) x 3 WITH ENDOSCOPIC HARVESTING OF RIGHT GREATER SAPHENOUS VEIN. LIMA TO LAD. (N/A) TRANSESOPHAGEAL ECHOCARDIOGRAM (TEE) (N/A)   Starting to wake on vent NSR w/ stable hemodynamics on low dose milrinone and Neo drips O2 sats 98-100% Chest tube output low UOP adequate Labs okay  Plan: Continue routine early postop  Rexene Alberts, MD 06/05/2019 6:16 PM

## 2019-06-05 NOTE — Anesthesia Procedure Notes (Signed)
Arterial Line Insertion Start/End9/05/2019 8:15 AM, 06/05/2019 8:25 AM Performed by: Moshe Salisbury, CRNA, CRNA  Patient location: Pre-op. Preanesthetic checklist: patient identified, IV checked, site marked, risks and benefits discussed, surgical consent, monitors and equipment checked, pre-op evaluation, timeout performed and anesthesia consent Lidocaine 1% used for infiltration and patient sedated Right, radial was placed Catheter size: 20 Fr Hand hygiene performed , maximum sterile barriers used  and Seldinger technique used Allen's test indicative of satisfactory collateral circulation Attempts: 1 Procedure performed without using ultrasound guided technique. Following insertion, dressing applied. Post procedure assessment: normal and unchanged

## 2019-06-05 NOTE — Progress Notes (Signed)
Pre Procedure note for inpatients:   Andre Gallegos has been scheduled for Procedure(s): CORONARY ARTERY BYPASS GRAFTING (CABG) (N/A) TRANSESOPHAGEAL ECHOCARDIOGRAM (TEE) (N/A) today. The various methods of treatment have been discussed with the patient. After consideration of the risks, benefits and treatment options the patient has consented to the planned procedure.   The patient has been seen and labs reviewed. There are no changes in the patient's condition to prevent proceeding with the planned procedure today.  Recent labs:  Lab Results  Component Value Date   WBC 10.6 (H) 06/05/2019   HGB 13.3 06/05/2019   HCT 38.2 (L) 06/05/2019   PLT 163 06/05/2019   GLUCOSE 114 (H) 06/05/2019   CHOL 113 06/02/2019   TRIG 334 (H) 06/02/2019   HDL 27 (L) 06/02/2019   LDLCALC 19 06/02/2019   ALT 39 06/04/2019   AST 38 06/04/2019   NA 137 06/05/2019   K 4.4 06/05/2019   CL 108 06/05/2019   CREATININE 1.35 (H) 06/05/2019   BUN 20 06/05/2019   CO2 22 06/05/2019   TSH 5.052 (H) 06/03/2019   INR 1.1 06/03/2019   HGBA1C 5.8 (H) 06/03/2019    Len Childs, MD 06/05/2019 6:58 AM

## 2019-06-06 ENCOUNTER — Encounter (HOSPITAL_COMMUNITY): Payer: Self-pay | Admitting: Cardiothoracic Surgery

## 2019-06-06 ENCOUNTER — Inpatient Hospital Stay (HOSPITAL_COMMUNITY): Payer: Medicare Other

## 2019-06-06 LAB — CBC
HCT: 27.1 % — ABNORMAL LOW (ref 39.0–52.0)
HCT: 29.2 % — ABNORMAL LOW (ref 39.0–52.0)
Hemoglobin: 9.2 g/dL — ABNORMAL LOW (ref 13.0–17.0)
Hemoglobin: 10.1 g/dL — ABNORMAL LOW (ref 13.0–17.0)
MCH: 31.9 pg (ref 26.0–34.0)
MCH: 32.6 pg (ref 26.0–34.0)
MCHC: 33.9 g/dL (ref 30.0–36.0)
MCHC: 34.6 g/dL (ref 30.0–36.0)
MCV: 94.1 fL (ref 80.0–100.0)
MCV: 94.2 fL (ref 80.0–100.0)
Platelets: 106 10*3/uL — ABNORMAL LOW (ref 150–400)
Platelets: 131 10*3/uL — ABNORMAL LOW (ref 150–400)
RBC: 2.88 MIL/uL — ABNORMAL LOW (ref 4.22–5.81)
RBC: 3.1 MIL/uL — ABNORMAL LOW (ref 4.22–5.81)
RDW: 12.6 % (ref 11.5–15.5)
RDW: 13 % (ref 11.5–15.5)
WBC: 11.5 10*3/uL — ABNORMAL HIGH (ref 4.0–10.5)
WBC: 19.3 10*3/uL — ABNORMAL HIGH (ref 4.0–10.5)
nRBC: 0 % (ref 0.0–0.2)
nRBC: 0 % (ref 0.0–0.2)

## 2019-06-06 LAB — GLUCOSE, CAPILLARY
Glucose-Capillary: 112 mg/dL — ABNORMAL HIGH (ref 70–99)
Glucose-Capillary: 114 mg/dL — ABNORMAL HIGH (ref 70–99)
Glucose-Capillary: 116 mg/dL — ABNORMAL HIGH (ref 70–99)
Glucose-Capillary: 119 mg/dL — ABNORMAL HIGH (ref 70–99)
Glucose-Capillary: 120 mg/dL — ABNORMAL HIGH (ref 70–99)
Glucose-Capillary: 121 mg/dL — ABNORMAL HIGH (ref 70–99)
Glucose-Capillary: 125 mg/dL — ABNORMAL HIGH (ref 70–99)
Glucose-Capillary: 125 mg/dL — ABNORMAL HIGH (ref 70–99)
Glucose-Capillary: 127 mg/dL — ABNORMAL HIGH (ref 70–99)
Glucose-Capillary: 130 mg/dL — ABNORMAL HIGH (ref 70–99)
Glucose-Capillary: 132 mg/dL — ABNORMAL HIGH (ref 70–99)
Glucose-Capillary: 135 mg/dL — ABNORMAL HIGH (ref 70–99)
Glucose-Capillary: 136 mg/dL — ABNORMAL HIGH (ref 70–99)
Glucose-Capillary: 140 mg/dL — ABNORMAL HIGH (ref 70–99)
Glucose-Capillary: 158 mg/dL — ABNORMAL HIGH (ref 70–99)

## 2019-06-06 LAB — BASIC METABOLIC PANEL
Anion gap: 8 (ref 5–15)
Anion gap: 10 (ref 5–15)
BUN: 19 mg/dL (ref 8–23)
BUN: 23 mg/dL (ref 8–23)
CO2: 22 mmol/L (ref 22–32)
CO2: 23 mmol/L (ref 22–32)
Calcium: 8.1 mg/dL — ABNORMAL LOW (ref 8.9–10.3)
Calcium: 8.2 mg/dL — ABNORMAL LOW (ref 8.9–10.3)
Chloride: 105 mmol/L (ref 98–111)
Chloride: 100 mmol/L (ref 98–111)
Creatinine, Ser: 1.68 mg/dL — ABNORMAL HIGH (ref 0.61–1.24)
Creatinine, Ser: 1.76 mg/dL — ABNORMAL HIGH (ref 0.61–1.24)
GFR calc Af Amer: 48 mL/min — ABNORMAL LOW (ref >60)
GFR calc Af Amer: 45 mL/min — ABNORMAL LOW (ref >60)
GFR calc non Af Amer: 41 mL/min — ABNORMAL LOW (ref >60)
GFR calc non Af Amer: 39 mL/min — ABNORMAL LOW (ref >60)
Glucose, Bld: 114 mg/dL — ABNORMAL HIGH (ref 70–99)
Glucose, Bld: 152 mg/dL — ABNORMAL HIGH (ref 70–99)
Potassium: 4.5 mmol/L (ref 3.5–5.1)
Potassium: 4.8 mmol/L (ref 3.5–5.1)
Sodium: 135 mmol/L (ref 135–145)
Sodium: 133 mmol/L — ABNORMAL LOW (ref 135–145)

## 2019-06-06 LAB — MAGNESIUM
Magnesium: 2.6 mg/dL — ABNORMAL HIGH (ref 1.7–2.4)
Magnesium: 2.5 mg/dL — ABNORMAL HIGH (ref 1.7–2.4)

## 2019-06-06 MED ORDER — INSULIN ASPART 100 UNIT/ML ~~LOC~~ SOLN
0.0000 [IU] | SUBCUTANEOUS | Status: DC
  Administered 2019-06-06 – 2019-06-07 (×5): 2 [IU] via SUBCUTANEOUS

## 2019-06-06 MED ORDER — FUROSEMIDE 10 MG/ML IJ SOLN
20.0000 mg | Freq: Two times a day (BID) | INTRAMUSCULAR | Status: DC
  Administered 2019-06-06: 20 mg via INTRAVENOUS
  Filled 2019-06-06 (×2): qty 2

## 2019-06-06 MED ORDER — INFLUENZA VAC A&B SA ADJ QUAD 0.5 ML IM PRSY: 0.5000 mL | PREFILLED_SYRINGE | INTRAMUSCULAR | Status: DC | PRN

## 2019-06-06 MED ORDER — INSULIN GLARGINE 100 UNIT/ML ~~LOC~~ SOLN
10.0000 [IU] | Freq: Two times a day (BID) | SUBCUTANEOUS | Status: DC
  Administered 2019-06-06 – 2019-06-08 (×4): 10 [IU] via SUBCUTANEOUS
  Filled 2019-06-06 (×7): qty 0.1

## 2019-06-06 MED ORDER — MILRINONE LACTATE IN DEXTROSE 20-5 MG/100ML-% IV SOLN
0.1250 ug/kg/min | INTRAVENOUS | Status: DC
  Filled 2019-06-06 (×2): qty 100

## 2019-06-06 NOTE — Progress Notes (Signed)
Progress Note  Patient Name: Andre Gallegos Date of Encounter: 06/06/2019  Primary Cardiologist: No primary care provider on file.   Subjective   Complaining of expected soreness today.  Otherwise no specific complaints.  Inpatient Medications    Scheduled Meds: . acetaminophen  1,000 mg Oral Q6H   Or  . acetaminophen (TYLENOL) oral liquid 160 mg/5 mL  1,000 mg Per Tube Q6H  . aspirin EC  325 mg Oral Daily   Or  . aspirin  324 mg Per Tube Daily  . atorvastatin  80 mg Oral q1800  . bisacodyl  10 mg Oral Daily   Or  . bisacodyl  10 mg Rectal Daily  . Chlorhexidine Gluconate Cloth  6 each Topical Daily  . docusate sodium  200 mg Oral Daily  . insulin regular  0-10 Units Intravenous TID WC  . metoprolol tartrate  12.5 mg Oral BID   Or  . metoprolol tartrate  12.5 mg Per Tube BID  . [START ON 06/07/2019] pantoprazole  40 mg Oral Daily  . sodium chloride flush  10-40 mL Intracatheter Q12H  . sodium chloride flush  3 mL Intravenous Q12H   Continuous Infusions: . sodium chloride Stopped (06/05/19 2011)  . sodium chloride    . sodium chloride 20 mL/hr at 06/05/19 1500  . albumin human 12.5 g (06/05/19 1657)  . cefUROXime (ZINACEF)  IV 1.5 g (06/06/19 0416)  . dexmedetomidine (PRECEDEX) IV infusion Stopped (06/06/19 0617)  . famotidine (PEPCID) IV Stopped (06/05/19 1530)  . insulin 2.1 mL/hr at 06/06/19 0700  . lactated ringers    . lactated ringers    . lactated ringers 10 mL/hr at 06/06/19 0700  . milrinone 0.25 mcg/kg/min (06/06/19 0700)  . nitroGLYCERIN 40 mcg/min (06/06/19 0700)  . phenylephrine (NEO-SYNEPHRINE) Adult infusion Stopped (06/05/19 2002)   PRN Meds: sodium chloride, albumin human, lactated ringers, metoprolol tartrate, midazolam, morphine injection, ondansetron (ZOFRAN) IV, oxyCODONE, sodium chloride flush, sodium chloride flush, traMADol   Vital Signs    Vitals:   06/06/19 0400 06/06/19 0500 06/06/19 0600 06/06/19 0700  BP: 108/64 115/64 124/64  120/61  Pulse: 81 80 80 83  Resp: 11 16 18  (!) 21  Temp: 99 F (37.2 C) 99.3 F (37.4 C) 99.5 F (37.5 C) 99.9 F (37.7 C)  TempSrc: Core     SpO2: 95% 95% 94% 93%  Weight:  98.7 kg    Height:        Intake/Output Summary (Last 24 hours) at 06/06/2019 0748 Last data filed at 06/06/2019 0700 Gross per 24 hour  Intake 4801.17 ml  Output 3645 ml  Net 1156.17 ml   Last 3 Weights 06/06/2019 06/04/2019 06/02/2019  Weight (lbs) 217 lb 9.6 oz 234 lb 12.6 oz 235 lb 3.7 oz  Weight (kg) 98.703 kg 106.5 kg 106.7 kg      Telemetry    Sinus rhythm with few PVCs - Personally Reviewed  ECG    Normal sinus rhythm 82 bpm, age-indeterminate inferior infarct, mild diffuse ST elevation consider pericarditis - Personally Reviewed  Physical Exam  Alert, oriented, in no distress GEN: No acute distress.   Neck: No JVD, Swan-Ganz catheter in place  Cardiac: RRR, no murmurs, rubs, or gallops.  Respiratory: Clear to auscultation bilaterally. GI: Soft, nontender, non-distended  MS:  Mild diffuse edema; No deformity. Neuro:  Nonfocal  Psych: Normal affect   Labs    High Sensitivity Troponin:   Recent Labs  Lab 06/01/19 1330 06/01/19 1611 06/03/19 1435  TROPONINIHS 8 2,089* 7,033*      Chemistry Recent Labs  Lab 06/03/19 0246 06/04/19 0233 06/05/19 0541  06/05/19 1436  06/05/19 2003 06/05/19 2010 06/06/19 0422  NA 137 137 137   < > 139   < > 137 139 135  K 4.4 4.3 4.4   < > 4.3   < > 4.7 4.7 4.5  CL 108 107 108  --   --   --  108  --  105  CO2 21* 22 22  --   --   --  22  --  22  GLUCOSE 114* 113* 114*   < > 95  --  143*  --  114*  BUN 21 23 20   --   --   --  16  --  19  CREATININE 1.56* 1.45* 1.35*  --   --   --  1.35*  --  1.68*  CALCIUM 9.0 8.9 8.8*  --   --   --  8.4*  --  8.1*  PROT 5.9* 6.0*  --   --   --   --   --   --   --   ALBUMIN 3.6 3.6  --   --   --   --   --   --   --   AST 63* 38  --   --   --   --   --   --   --   ALT 37 39  --   --   --   --   --   --   --    ALKPHOS 52 53  --   --   --   --   --   --   --   BILITOT 1.1 0.8  --   --   --   --   --   --   --   GFRNONAA 45* 49* 54*  --   --   --  54*  --  41*  GFRAA 52* 57* >60  --   --   --  >60  --  48*  ANIONGAP 8 8 7   --   --   --  7  --  8   < > = values in this interval not displayed.     Hematology Recent Labs  Lab 06/05/19 1414  06/05/19 2003 06/05/19 2010 06/06/19 0422  WBC 15.3*  --  12.2*  --  11.5*  RBC 3.06*  --  3.13*  --  2.88*  HGB 9.7*   < > 10.1* 9.2* 9.2*  HCT 28.6*   < > 28.6* 27.0* 27.1*  MCV 93.5  --  91.4  --  94.1  MCH 31.7  --  32.3  --  31.9  MCHC 33.9  --  35.3  --  33.9  RDW 12.4  --  12.5  --  12.6  PLT 110*  --  115*  --  106*   < > = values in this interval not displayed.    BNP Recent Labs  Lab 06/01/19 2102  BNP 66.6     DDimer No results for input(s): DDIMER in the last 168 hours.   Radiology    Dg Chest Port 1 View  Result Date: 06/06/2019 CLINICAL DATA:  Follow-up chest tube EXAM: PORTABLE CHEST 1 VIEW COMPARISON:  06/05/2019 FINDINGS: Cardiac shadow is mildly enlarged. Swan-Ganz catheter mediastinal drain and left thoracostomy catheter are again seen. Endotracheal tube and gastric  catheter have been removed in the interval. No pneumothorax is seen. Elevation of the right hemidiaphragm with small right effusion is again noted. No other focal infiltrate is seen. IMPRESSION: Small right-sided effusion with elevation of the hemidiaphragm. Tubes and lines as described. Electronically Signed   By: Inez Catalina M.D.   On: 06/06/2019 07:27   Dg Chest Port 1 View  Result Date: 06/05/2019 CLINICAL DATA:  Postop CABG EXAM: PORTABLE CHEST 1 VIEW COMPARISON:  06/04/2019 FINDINGS: Interval sternotomy changes. Endotracheal tube tip is about 4.9 cm superior to the carina. Esophageal tube tip in the left upper quadrant. Right IJ Swan-Ganz catheter tip over the pulmonary outflow tract. Midline and left-sided chest tubes are in place. No pneumothorax. Suspected  bilateral pleural effusions. Right greater than left basilar airspace disease. Borderline enlargement of the heart size. Mild central congestion. IMPRESSION: 1. Interval sternotomy changes. Placement of support lines and tubes as above. Endotracheal tube tip about 4.9 cm superior to carina 2. Development of suspected bilateral pleural effusions, likely layering on the right, and basilar airspace disease. 3. Borderline cardiomegaly with central vascular congestion. Electronically Signed   By: Donavan Foil M.D.   On: 06/05/2019 14:56   Vas US Doppler Pre Cabg  Result Date: 06/04/2019 PREOPERATIVE VASCULAR EVALUATION  Indications:      Pre-surgical evaluation. Risk Factors:     Hyperlipidemia, past history of smoking, coronary artery                   disease. Limitations:      Exam completed with patient in recliner in ICU Comparison Study: No prior study. Performing Technologist: Maudry Mayhew MHA, RDMS, RVT, RDCS  Examination Guidelines: A complete evaluation includes B-mode imaging, spectral Doppler, color Doppler, and power Doppler as needed of all accessible portions of each vessel. Bilateral testing is considered an integral part of a complete examination. Limited examinations for reoccurring indications may be performed as noted.  Right Carotid Findings: +----------+-------+-------+--------+---------------------------------+--------+           PSV    EDV    StenosisDescribe                         Comments           cm/s   cm/s                                                     +----------+-------+-------+--------+---------------------------------+--------+ CCA Prox  63     8              smooth, heterogenous and calcific         +----------+-------+-------+--------+---------------------------------+--------+ CCA Distal70     12             smooth, heterogenous and calcific         +----------+-------+-------+--------+---------------------------------+--------+ ICA Prox   72     11             irregular, heterogenous and                                               calcific                                  +----------+-------+-------+--------+---------------------------------+--------+  ICA Distal98     21                                                       +----------+-------+-------+--------+---------------------------------+--------+ ECA       100    7              smooth, heterogenous and calcific         +----------+-------+-------+--------+---------------------------------+--------+ Portions of this table do not appear on this page. +----------+--------+-------+----------------+------------+           PSV cm/sEDV cmsDescribe        Arm Pressure +----------+--------+-------+----------------+------------+ Subclavian77             Multiphasic, [id number]          +----------+--------+-------+----------------+------------+ +---------+--------+--+--------+--+---------+ VertebralPSV cm/s55EDV cm/s12Antegrade +---------+--------+--+--------+--+---------+ Left Carotid Findings: +----------+--------+-------+--------+--------------------------------+--------+           PSV cm/sEDV    StenosisDescribe                        Comments                   cm/s                                                    +----------+--------+-------+--------+--------------------------------+--------+ CCA Prox  116     17             irregular, heterogenous and                                               focal                                    +----------+--------+-------+--------+--------------------------------+--------+ CCA Distal68      16                                                      +----------+--------+-------+--------+--------------------------------+--------+ ICA Prox  48      12             smooth, heterogenous and                                                  calcific                                  +----------+--------+-------+--------+--------------------------------+--------+ ICA Distal84      20                                                      +----------+--------+-------+--------+--------------------------------+--------+  ECA       90      8              smooth, heterogenous and                                                  calcific                                 +----------+--------+-------+--------+--------------------------------+--------+ +----------+--------+--------+----------------+------------+ SubclavianPSV cm/sEDV cm/sDescribe        Arm Pressure +----------+--------+--------+----------------+------------+           128             Multiphasic, WNL             +----------+--------+--------+----------------+------------+ +---------+--------+--+--------+--+---------+ VertebralPSV cm/s56EDV cm/s13Antegrade +---------+--------+--+--------+--+---------+  ABI Findings: +--------+------------------+-----+---------+--------+ Right   Rt Pressure (mmHg)IndexWaveform Comment  +--------+------------------+-----+---------+--------+ [id number]                    triphasic         +--------+------------------+-----+---------+--------+ PTA                            triphasic         +--------+------------------+-----+---------+--------+ DP                             triphasic         +--------+------------------+-----+---------+--------+ +--------+------------------+-----+---------+----------------------------------+ Left    Lt Pressure (mmHg)IndexWaveform Comment                            +--------+------------------+-----+---------+----------------------------------+ Brachial                       triphasicUnable to obtain pressure due to                                           IV location                        +--------+------------------+-----+---------+----------------------------------+ PTA                             biphasic                                    +--------+------------------+-----+---------+----------------------------------+ DP                             triphasic                                   +--------+------------------+-----+---------+----------------------------------+  Right Doppler Findings: +-----------+--------+-----+---------+-----------------------------------------+ Site       PressureIndexDoppler  Comments                                  +-----------+--------+-----+---------+-----------------------------------------+  Brachial   148          triphasic                                          +-----------+--------+-----+---------+-----------------------------------------+ Radial                  triphasic                                          +-----------+--------+-----+---------+-----------------------------------------+ Ulnar                   triphasic                                          +-----------+--------+-----+---------+-----------------------------------------+ Palmar Arch                      Doppler waveforms remain within normal                                     limits with right radial compression and                                   right ulnar compression.                  +-----------+--------+-----+---------+-----------------------------------------+  Left Doppler Findings: +-----------+--------+-----+---------+-----------------------------------------+ Site       PressureIndexDoppler  Comments                                  +-----------+--------+-----+---------+-----------------------------------------+ Brachial                triphasicUnable to obtain pressure due to IV                                        location                                  +-----------+--------+-----+---------+-----------------------------------------+ Radial                  triphasic                                           +-----------+--------+-----+---------+-----------------------------------------+ Ulnar                   triphasic                                          +-----------+--------+-----+---------+-----------------------------------------+ Palmar Arch                      Doppler waveforms remain  within normal                                     limits with left radial compression.                                       Doppler waveform obliterates with left                                     ulnar compression.                        +-----------+--------+-----+---------+-----------------------------------------+  Summary: Right Carotid: Velocities in the right ICA are consistent with a 1-39% stenosis. Left Carotid: Velocities in the left ICA are consistent with a 1-39% stenosis. Vertebrals:  Bilateral vertebral arteries demonstrate antegrade flow. Subclavians: Normal flow hemodynamics were seen in bilateral subclavian              arteries. No significant arterial obstruction detected in the right upper extremity. No significant arterial obstruction detected in the left upper extremity.  Electronically signed by Servando Snare MD on 06/04/2019 at 1:10:33 PM.    Final      Patient Profile     68 y.o. male with inferior STEMI, initial attempts at PCI unsuccessful, transferred to [facility] for CABG.  Patient underwent multivessel CABG 06/05/2019  Assessment & Plan    1.  Inferior STEMI with multivessel CAD: Now status post CABG postoperative day #1.  Patient hemodynamically stable, progressing well in the early postoperative period, would anticipate starting Plavix once all of his lines are out.  2.  Hypertension: Currently on IV nitroglycerin for blood pressure control.  Once he is taking p.o. would institute oral regimen  3.  AKI on CKD stage III: Maintaining adequate urine output  4.  Expected postoperative blood loss anemia  5.  Postoperative  atrial fibrillation: Maintaining normal sinus rhythm on IV amiodarone      For questions or updates, please contact [place] Please consult [email or web address] for contact info under        Signed, Sherren Mocha, MD  06/06/2019, 7:48 AM

## 2019-06-06 NOTE — Progress Notes (Addendum)
[address]       [place] [address]             [phone]      1 Day Post-Op Procedure(s) (LRB): CORONARY ARTERY BYPASS GRAFTING (CABG) x 3 WITH ENDOSCOPIC HARVESTING OF RIGHT GREATER SAPHENOUS VEIN. LIMA TO LAD. (N/A) TRANSESOPHAGEAL ECHOCARDIOGRAM (TEE) (N/A) Subjective: Minor discomfort but feeling pretty well  Objective: Vital signs in last 24 hours: Temp:  [96.4 F (35.8 C)-100.6 F (38.1 C)] 99.7 F (37.6 C) (09/10 0830) Pulse Rate:  [80-92] 82 (09/10 0900) Cardiac Rhythm: Atrial paced (09/10 0400) Resp:  [10-22] 14 (09/10 0900) BP: (93-124)/(46-70) 114/63 (09/10 0900) SpO2:  [91 %-100 %] 91 % (09/10 0900) Arterial Line BP: (91-182)/(34-60) 150/49 (09/10 0900) FiO2 (%):  [40 %-50 %] 40 % (09/09 1817) Weight:  [98.7 kg] 98.7 kg (09/10 0500)  Hemodynamic parameters for last 24 hours: PAP: (13-42)/(1-26) 28/11 CO:  [5.1 L/min-8.2 L/min] 6.8 L/min CI:  [2.2 L/min/m2-3.6 L/min/m2] 3 L/min/m2  Intake/Output from previous day: 09/09 0701 - 09/10 0700 In: 4801.2 [I.V.:3199.9; Blood:200; IV Piggyback:1401.3] Out: [id number] [Urine:2685; Blood:450; Chest Tube:510] Intake/Output this shift: Total I/O In: 307.8 [P.O.:240; I.V.:67.8] Out: 60 [Urine:60]  General appearance: alert, cooperative and no distress Heart: regular rate and rhythm Lungs: dim right lower fields Abdomen: soft, non-tender Extremities: min edema Wound: dressings CDI  Lab Results: Recent Labs    06/05/19 2003 06/05/19 2010 06/06/19 0422  WBC 12.2*  --  11.5*  HGB 10.1* 9.2* 9.2*  HCT 28.6* 27.0* 27.1*  PLT 115*  --  106*   BMET:  Recent Labs    06/05/19 2003 06/05/19 2010 06/06/19 0422  NA 137 139 135  K 4.7 4.7 4.5  CL 108  --  105  CO2 22  --  22  GLUCOSE 143*  --  114*  BUN 16  --  19  CREATININE 1.35*  --  1.68*  CALCIUM 8.4*  --  8.1*    PT/INR:  Recent Labs    06/05/19 1414  LABPROT 17.1*  INR 1.4*   ABG    Component Value Date/Time   PHART  7.381 06/05/2019 2010   HCO3 22.1 06/05/2019 2010   TCO2 23 06/05/2019 2010   ACIDBASEDEF 2.0 06/05/2019 2010   O2SAT 93.0 06/05/2019 2010   CBG (last 3)  Recent Labs    06/06/19 0406 06/06/19 0507 06/06/19 0731  GLUCAP 116* 112* 125*    Meds Scheduled Meds: . acetaminophen  1,000 mg Oral Q6H   Or  . acetaminophen (TYLENOL) oral liquid 160 mg/5 mL  1,000 mg Per Tube Q6H  . aspirin EC  325 mg Oral Daily   Or  . aspirin  324 mg Per Tube Daily  . atorvastatin  80 mg Oral q1800  . bisacodyl  10 mg Oral Daily   Or  . bisacodyl  10 mg Rectal Daily  . Chlorhexidine Gluconate Cloth  6 each Topical Daily  . docusate sodium  200 mg Oral Daily  . insulin aspart  0-24 Units Subcutaneous Q4H  . insulin glargine  10 Units Subcutaneous BID  . metoprolol tartrate  12.5 mg Oral BID   Or  . metoprolol tartrate  12.5 mg Per Tube BID  . [START ON 06/07/2019] pantoprazole  40 mg Oral Daily  . sodium chloride flush  10-40 mL Intracatheter Q12H  . sodium chloride flush  3 mL Intravenous Q12H   Continuous Infusions: . sodium chloride Stopped (06/05/19 2011)  .  sodium chloride    . sodium chloride 20 mL/hr at 06/05/19 1500  . albumin human 12.5 g (06/05/19 1657)  . cefUROXime (ZINACEF)  IV 1.5 g (06/06/19 0416)  . dexmedetomidine (PRECEDEX) IV infusion Stopped (06/06/19 0617)  . famotidine (PEPCID) IV Stopped (06/05/19 1530)  . insulin 2.8 mL/hr at 06/06/19 0900  . lactated ringers    . lactated ringers    . lactated ringers 10 mL/hr at 06/06/19 0900  . milrinone 0.25 mcg/kg/min (06/06/19 0900)  . nitroGLYCERIN 30 mcg/min (06/06/19 0900)  . phenylephrine (NEO-SYNEPHRINE) Adult infusion Stopped (06/05/19 2002)   PRN Meds:.sodium chloride, albumin human, lactated ringers, metoprolol tartrate, midazolam, morphine injection, ondansetron (ZOFRAN) IV, oxyCODONE, sodium chloride flush, sodium chloride flush, traMADol  Xrays Dg Chest Port 1 View  Result Date: 06/06/2019 CLINICAL DATA:   Follow-up chest tube EXAM: PORTABLE CHEST 1 VIEW COMPARISON:  06/05/2019 FINDINGS: Cardiac shadow is mildly enlarged. Swan-Ganz catheter mediastinal drain and left thoracostomy catheter are again seen. Endotracheal tube and gastric catheter have been removed in the interval. No pneumothorax is seen. Elevation of the right hemidiaphragm with small right effusion is again noted. No other focal infiltrate is seen. IMPRESSION: Small right-sided effusion with elevation of the hemidiaphragm. Tubes and lines as described. Electronically Signed   By: Inez Catalina M.D.   On: 06/06/2019 07:27   Dg Chest Port 1 View  Result Date: 06/05/2019 CLINICAL DATA:  Postop CABG EXAM: PORTABLE CHEST 1 VIEW COMPARISON:  06/04/2019 FINDINGS: Interval sternotomy changes. Endotracheal tube tip is about 4.9 cm superior to the carina. Esophageal tube tip in the left upper quadrant. Right IJ Swan-Ganz catheter tip over the pulmonary outflow tract. Midline and left-sided chest tubes are in place. No pneumothorax. Suspected bilateral pleural effusions. Right greater than left basilar airspace disease. Borderline enlargement of the heart size. Mild central congestion. IMPRESSION: 1. Interval sternotomy changes. Placement of support lines and tubes as above. Endotracheal tube tip about 4.9 cm superior to carina 2. Development of suspected bilateral pleural effusions, likely layering on the right, and basilar airspace disease. 3. Borderline cardiomegaly with central vascular congestion. Electronically Signed   By: Donavan Foil M.D.   On: 06/05/2019 14:56   Vas US Doppler Pre Cabg  Result Date: 06/04/2019 PREOPERATIVE VASCULAR EVALUATION  Indications:      Pre-surgical evaluation. Risk Factors:     Hyperlipidemia, past history of smoking, coronary artery                   disease. Limitations:      Exam completed with patient in recliner in ICU Comparison Study: No prior study. Performing Technologist: Maudry Mayhew MHA, RDMS, RVT, RDCS   Examination Guidelines: A complete evaluation includes B-mode imaging, spectral Doppler, color Doppler, and power Doppler as needed of all accessible portions of each vessel. Bilateral testing is considered an integral part of a complete examination. Limited examinations for reoccurring indications may be performed as noted.  Right Carotid Findings: +----------+-------+-------+--------+---------------------------------+--------+           PSV    EDV    StenosisDescribe                         Comments           cm/s   cm/s                                                     +----------+-------+-------+--------+---------------------------------+--------+  CCA Prox  63     8              smooth, heterogenous and calcific         +----------+-------+-------+--------+---------------------------------+--------+ CCA Distal70     12             smooth, heterogenous and calcific         +----------+-------+-------+--------+---------------------------------+--------+ ICA Prox  72     11             irregular, heterogenous and                                               calcific                                  +----------+-------+-------+--------+---------------------------------+--------+ ICA Distal98     21                                                       +----------+-------+-------+--------+---------------------------------+--------+ ECA       100    7              smooth, heterogenous and calcific         +----------+-------+-------+--------+---------------------------------+--------+ Portions of this table do not appear on this page. +----------+--------+-------+----------------+------------+           PSV cm/sEDV cmsDescribe        Arm Pressure +----------+--------+-------+----------------+------------+ Subclavian77             Multiphasic, [id number]          +----------+--------+-------+----------------+------------+  +---------+--------+--+--------+--+---------+ VertebralPSV cm/s55EDV cm/s12Antegrade +---------+--------+--+--------+--+---------+ Left Carotid Findings: +----------+--------+-------+--------+--------------------------------+--------+           PSV cm/sEDV    StenosisDescribe                        Comments                   cm/s                                                    +----------+--------+-------+--------+--------------------------------+--------+ CCA Prox  116     17             irregular, heterogenous and                                               focal                                    +----------+--------+-------+--------+--------------------------------+--------+ CCA Distal68      16                                                      +----------+--------+-------+--------+--------------------------------+--------+  ICA Prox  48      12             smooth, heterogenous and                                                  calcific                                 +----------+--------+-------+--------+--------------------------------+--------+ ICA Distal84      20                                                      +----------+--------+-------+--------+--------------------------------+--------+ ECA       90      8              smooth, heterogenous and                                                  calcific                                 +----------+--------+-------+--------+--------------------------------+--------+ +----------+--------+--------+----------------+------------+ SubclavianPSV cm/sEDV cm/sDescribe        Arm Pressure +----------+--------+--------+----------------+------------+           128             Multiphasic, WNL             +----------+--------+--------+----------------+------------+ +---------+--------+--+--------+--+---------+ VertebralPSV cm/s56EDV cm/s13Antegrade  +---------+--------+--+--------+--+---------+  ABI Findings: +--------+------------------+-----+---------+--------+ Right   Rt Pressure (mmHg)IndexWaveform Comment  +--------+------------------+-----+---------+--------+ [id number]                    triphasic         +--------+------------------+-----+---------+--------+ PTA                            triphasic         +--------+------------------+-----+---------+--------+ DP                             triphasic         +--------+------------------+-----+---------+--------+ +--------+------------------+-----+---------+----------------------------------+ Left    Lt Pressure (mmHg)IndexWaveform Comment                            +--------+------------------+-----+---------+----------------------------------+ Brachial                       triphasicUnable to obtain pressure due to                                           IV location                        +--------+------------------+-----+---------+----------------------------------+ PTA  biphasic                                    +--------+------------------+-----+---------+----------------------------------+ DP                             triphasic                                   +--------+------------------+-----+---------+----------------------------------+  Right Doppler Findings: +-----------+--------+-----+---------+-----------------------------------------+ Site       PressureIndexDoppler  Comments                                  +-----------+--------+-----+---------+-----------------------------------------+ Brachial   148          triphasic                                          +-----------+--------+-----+---------+-----------------------------------------+ Radial                  triphasic                                           +-----------+--------+-----+---------+-----------------------------------------+ Ulnar                   triphasic                                          +-----------+--------+-----+---------+-----------------------------------------+ Palmar Arch                      Doppler waveforms remain within normal                                     limits with right radial compression and                                   right ulnar compression.                  +-----------+--------+-----+---------+-----------------------------------------+  Left Doppler Findings: +-----------+--------+-----+---------+-----------------------------------------+ Site       PressureIndexDoppler  Comments                                  +-----------+--------+-----+---------+-----------------------------------------+ Brachial                triphasicUnable to obtain pressure due to IV                                        location                                  +-----------+--------+-----+---------+-----------------------------------------+  Radial                  triphasic                                          +-----------+--------+-----+---------+-----------------------------------------+ Ulnar                   triphasic                                          +-----------+--------+-----+---------+-----------------------------------------+ Palmar Arch                      Doppler waveforms remain within normal                                     limits with left radial compression.                                       Doppler waveform obliterates with left                                     ulnar compression.                        +-----------+--------+-----+---------+-----------------------------------------+  Summary: Right Carotid: Velocities in the right ICA are consistent with a 1-39% stenosis. Left Carotid: Velocities in the left ICA are  consistent with a 1-39% stenosis. Vertebrals:  Bilateral vertebral arteries demonstrate antegrade flow. Subclavians: Normal flow hemodynamics were seen in bilateral subclavian              arteries. No significant arterial obstruction detected in the right upper extremity. No significant arterial obstruction detected in the left upper extremity.  Electronically signed by Servando Snare MD on 06/04/2019 at 1:10:33 PM.    Final     Assessment/Plan: S/P Procedure(s) (LRB): CORONARY ARTERY BYPASS GRAFTING (CABG) x 3 WITH ENDOSCOPIC HARVESTING OF RIGHT GREATER SAPHENOUS VEIN. LIMA TO LAD. 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 3 sats good on 4liters, routine pulm toilet 4 BS well controlled- no preop DM meds 5 ABL anemia- monitor, not at transfusion threshhold 6 minor leukocytosis- reactive inflammation  7 thrombocytopenia- fairly stable, monitor 8 sganz removed, aline later today 9 390 cc out fro CT yesterday, 120 so far today- cont to monitor 10 creat rising to 1.68, good UOP, weight not accurate but doesn't appear significantly volume overloaded, will probably need some diuretic Limit nephrotoxic meds 11 see progression orders per MD  LOS: 5 days    John Giovanni PA-C 06/06/2019 Pager [phone]  Mobilize patient, leave chest tubes in today Follow elevated postop creatinine Probably wean off milrinone tomorrow  patient examined and medical record reviewed,agree with above note. Tharon Aquas Trigt III 06/06/2019

## 2019-06-06 NOTE — Op Note (Signed)
NAME: Andre Gallegos, GIAMMANCO MEDICAL RECORD [id number] ACCOUNT [id number] DATE OF BIRTH:[birth year] FACILITY: [facility] LOCATION: [facility] PHYSICIAN:Keona Bilyeu VAN TRIGT III, MD  OPERATIVE REPORT  DATE OF PROCEDURE:  06/05/2019  OPERATION: 1.  Coronary artery bypass grafting x3 (left internal mammary artery to the left anterior descending, saphenous vein graft to diagonal, saphenous vein graft to posterior descending). 2.  Endoscopic harvest of right leg greater saphenous vein.  SURGEON:  Len Childs, MD  ASSISTANT:  Jadene Pierini, PA-C  ANESTHESIA:  General.  PREOPERATIVE DIAGNOSES:  Status post non-ST elevation myocardial infarction with unstable angina, multivessel coronary artery disease.  POSTOPERATIVE DIAGNOSES:  Status post non-ST elevation myocardial infarction with unstable angina, multivessel coronary artery disease.  PROCEDURE:  The patient was brought from preop holding after informed consent was documented and all final issues were addressed.  He is a 68 year old nondiabetic male who presented to [facility] with symptoms of unstable angina and  positive cardiac enzymes.  He underwent urgent cardiac catheterization and was preloaded with Brilinta.  Findings at cath included heavily diseased right coronary artery with high-grade stenosis that could not be opened with PCI.  He was transferred to  this hospital on balloon pump and was managed medically for the next 72 hours.  He stabilized and after Brilinta washout, was prepared for multivessel coronary bypass grafting, which was recommended by his cardiology team.  I discussed the procedure of  CABG in detail with the patient including the benefits and risks, and he understood and agreed to proceed with surgery.  DESCRIPTION OF PROCEDURE:  The patient was placed supine on the operating table.  General anesthesia was induced under invasive hemodynamic monitoring.  He remained stable.  A transesophageal echo probe was  placed by the anesthesia team.  This showed  some inferior wall hypokinesia.  No significant valvular disease.  The patient was prepped and draped as a sterile field.  A proper time-out was performed.  A sternal incision was made as the saphenous vein was harvested endoscopically from the right  leg.  The left internal mammary artery was harvested as a pedicle graft from its origin at the subclavian vessels.  It was a 1.5 mm vessel with good flow.  The sternal retractor was placed using the deep blades because of the patient's obese body  habitus.  The pericardium was suspended, and pursestrings were placed in the ascending aorta and right atrium.  Heparin was administered.  The patient was cannulated and placed on cardiopulmonary bypass.  The coronaries were identified for grafting, and  the mammary artery and vein grafts were prepared for the distal anastomoses.  Cardioplegic cannulas were placed both antegrade and retrograde cold blood cardioplegia, and the patient was cooled to 32 degrees.  The aortic crossclamp was applied, and 1 L  of cold blood cardioplegia was delivered in split doses between the antegrade aortic and retrograde coronary sinus catheters.  Good cardioplegic arrest, and supple temperature dropped less than 12 degrees.  Cardioplegia was delivered every 20 minutes.  The distal coronary anastomoses were performed.  The first distal anastomosis was the mid posterior descending.  Proximally, there was a 99% stenosis.  It was heavily diseased proximally.  A reverse saphenous vein was sewn end-to-side with running 7-0  Prolene with good flow through the graft.  Cardioplegia was redosed.  A second distal anastomosis was placed to the diagonal branch of LAD.  The ostium of the diagonal had a 90% stenosis.  Reverse saphenous vein was sewn end-to-side with  running 7-0 Prolene with good flow through the graft.  Cardioplegia was redosed.  The third distal anastomosis was to the mid-LAD.  This  was a 1.5 mm vessel with proximal 70% stenosis.  The left IMA pedicle was brought through an opening in the left lateral pericardium and was brought down onto the LAD and sewn end-to-side with  running 8-0 Prolene.  There was good flow through the anastomosis after briefly removing the pedicle clamp on the mammary artery.  The bulldog was reapplied and the pedicle secured to the epicardium with 6-0 Prolenes.  Cardioplegia was redosed.  While the cross-clamp was still in place, 2 proximal vein anastomoses were performed on the ascending aorta using a 4.5 mm punch and running 6-0 Prolene.  Prior to tying down the final proximal anastomosis, air was vented from the coronaries with a dose  of retrograde warm blood cardioplegia.  The cross-clamp was removed.  The heart resumed a spontaneous rhythm.  The vein grafts were deaired and opened.  Each had good flow.  Hemostasis was documented at the proximal and distal sites.  The patient was rewarmed and reperfused.  Temporary pacing wires were applied.  The  patient was weaned from cardiopulmonary bypass without difficulty.  He was placed on low-dose milrinone.  Echo showed improvement in global LV function.  Cardiac output was 5 L per minute.  Protamine was administered.  There was a protamine reaction of  systemic hypotension.  The full dose of protamine was not able to be administered because of the transient reactive hypotension.  There was no rash or change in pulmonary artery pressures.  The cannulas were removed.  The mediastinum was irrigated.  The superior pericardial fat was closed over the aorta.  The anterior mediastinum and left pleural chest tubes were placed and brought out through separate incisions.  The sternum was closed  with wire.  He remained stable.  The pectoralis fascia was closed with a running Vicryl.  Subcutaneous and skin layers were closed with running Vicryl.  Total cardiopulmonary bypass time was 104 minutes.  LN/NUANCE   D:06/05/2019 T:06/05/2019 JOB:[id number]

## 2019-06-06 NOTE — Progress Notes (Signed)
EVENING ROUNDS NOTE :     [address]       [place] [address]             [phone]                 1 Day Post-Op Procedure(s) (LRB): CORONARY ARTERY BYPASS GRAFTING (CABG) x 3 WITH ENDOSCOPIC HARVESTING OF RIGHT GREATER SAPHENOUS VEIN. LIMA TO LAD. (N/A) TRANSESOPHAGEAL ECHOCARDIOGRAM (TEE) (N/A)   Total Length of Stay:  LOS: 5 days  Events:  Doing well ambulated earlier today.  Weaning milr.    BP 128/73   Pulse 82   Temp 98.4 F (36.9 C) (Oral)   Resp 15   Ht 6' (1.829 m)   Wt 98.7 kg   SpO2 93%   BMI 29.51 kg/m   PAP: (13-42)/(1-26) 28/11 CO:  [6.7 L/min-8.2 L/min] 6.8 L/min CI:  [2.9 L/min/m2-3.6 L/min/m2] 3 L/min/m2     . cefUROXime (ZINACEF)  IV 1.5 g (06/06/19 1619)  . lactated ringers    . lactated ringers    . lactated ringers 10 mL/hr at 06/06/19 1700  . milrinone 0.125 mcg/kg/min (06/06/19 1700)  . nitroGLYCERIN Stopped (06/06/19 1612)  . phenylephrine (NEO-SYNEPHRINE) Adult infusion Stopped (06/05/19 2002)    I/O last 3 completed shifts: In: 5769.3 [I.V.:4168; Blood:200; IV Piggyback:1401.3] Out: [id number] [Urine:4110; Blood:450; Chest Tube:510]   CBC Latest Ref Rng & Units 06/06/2019 06/06/2019 06/05/2019  WBC 4.0 - 10.5 K/uL 19.3(H) 11.5(H) -  Hemoglobin 13.0 - 17.0 g/dL 10.1(L) 9.2(L) 9.2(L)  Hematocrit 39.0 - 52.0 % 29.2(L) 27.1(L) 27.0(L)  Platelets 150 - 400 K/uL 131(L) 106(L) -    BMP Latest Ref Rng & Units 06/06/2019 06/06/2019 06/05/2019  Glucose 70 - 99 mg/dL 152(H) 114(H) -  BUN 8 - 23 mg/dL 23 19 -  Creatinine 0.61 - 1.24 mg/dL 1.76(H) 1.68(H) -  BUN/Creat Ratio 10 - 24 - - -  Sodium 135 - 145 mmol/L 133(L) 135 139  Potassium 3.5 - 5.1 mmol/L 4.8 4.5 4.7  Chloride 98 - 111 mmol/L 100 105 -  CO2 22 - 32 mmol/L 23 22 -  Calcium 8.9 - 10.3 mg/dL 8.2(L) 8.1(L) -    ABG    Component Value Date/Time   PHART 7.381 06/05/2019 2010   PCO2ART 37.7 06/05/2019 2010   PO2ART 71.0 (L) 06/05/2019 2010   HCO3 22.1 06/05/2019  2010   TCO2 23 06/05/2019 2010   ACIDBASEDEF 2.0 06/05/2019 2010   O2SAT 93.0 06/05/2019 2010       Andre Bouillon, MD 06/06/2019 6:38 PM

## 2019-06-06 NOTE — Anesthesia Postprocedure Evaluation (Signed)
Anesthesia Post Note  Patient: Andre Gallegos  Procedure(s) Performed: CORONARY ARTERY BYPASS GRAFTING (CABG) x 3 WITH ENDOSCOPIC HARVESTING OF RIGHT GREATER SAPHENOUS VEIN. LIMA TO LAD. (N/A Chest) TRANSESOPHAGEAL ECHOCARDIOGRAM (TEE) (N/A )     Patient location during evaluation: PACU Anesthesia Type: General Level of consciousness: awake and alert Pain management: pain level controlled Vital Signs Assessment: post-procedure vital signs reviewed and stable Respiratory status: spontaneous breathing, nonlabored ventilation, respiratory function stable and patient connected to nasal cannula oxygen Cardiovascular status: blood pressure returned to baseline and stable Postop Assessment: no apparent nausea or vomiting Anesthetic complications: no Comments: Extubated POD#0, doing well.    Last Vitals:  Vitals:   06/06/19 0400 06/06/19 0500  BP: 108/64 115/64  Pulse: 81 80  Resp: 11 16  Temp: 37.2 C 37.4 C  SpO2: 95% 95%    Last Pain:  Vitals:   06/06/19 0400  TempSrc: Core  PainSc: [place]

## 2019-06-07 ENCOUNTER — Inpatient Hospital Stay (HOSPITAL_COMMUNITY): Payer: Medicare Other

## 2019-06-07 ENCOUNTER — Inpatient Hospital Stay: Payer: Self-pay

## 2019-06-07 LAB — GLUCOSE, CAPILLARY
Glucose-Capillary: 136 mg/dL — ABNORMAL HIGH (ref 70–99)
Glucose-Capillary: 147 mg/dL — ABNORMAL HIGH (ref 70–99)
Glucose-Capillary: 148 mg/dL — ABNORMAL HIGH (ref 70–99)
Glucose-Capillary: 149 mg/dL — ABNORMAL HIGH (ref 70–99)
Glucose-Capillary: 159 mg/dL — ABNORMAL HIGH (ref 70–99)
Glucose-Capillary: 160 mg/dL — ABNORMAL HIGH (ref 70–99)

## 2019-06-07 LAB — BASIC METABOLIC PANEL
Anion gap: 8 (ref 5–15)
BUN: 28 mg/dL — ABNORMAL HIGH (ref 8–23)
CO2: 24 mmol/L (ref 22–32)
Calcium: 8.4 mg/dL — ABNORMAL LOW (ref 8.9–10.3)
Chloride: 100 mmol/L (ref 98–111)
Creatinine, Ser: 1.81 mg/dL — ABNORMAL HIGH (ref 0.61–1.24)
GFR calc Af Amer: 44 mL/min — ABNORMAL LOW (ref >60)
GFR calc non Af Amer: 38 mL/min — ABNORMAL LOW (ref >60)
Glucose, Bld: 157 mg/dL — ABNORMAL HIGH (ref 70–99)
Potassium: 4.8 mmol/L (ref 3.5–5.1)
Sodium: 132 mmol/L — ABNORMAL LOW (ref 135–145)

## 2019-06-07 LAB — COOXEMETRY PANEL
Carboxyhemoglobin: 1.8 % — ABNORMAL HIGH (ref 0.5–1.5)
Methemoglobin: 1.3 % (ref 0.0–1.5)
O2 Saturation: 54.1 %
Total hemoglobin: 9.8 g/dL — ABNORMAL LOW (ref 12.0–16.0)

## 2019-06-07 LAB — CBC
HCT: 28.2 % — ABNORMAL LOW (ref 39.0–52.0)
Hemoglobin: 9.4 g/dL — ABNORMAL LOW (ref 13.0–17.0)
MCH: 31.9 pg (ref 26.0–34.0)
MCHC: 33.3 g/dL (ref 30.0–36.0)
MCV: 95.6 fL (ref 80.0–100.0)
Platelets: 132 10*3/uL — ABNORMAL LOW (ref 150–400)
RBC: 2.95 MIL/uL — ABNORMAL LOW (ref 4.22–5.81)
RDW: 13.1 % (ref 11.5–15.5)
WBC: 18.5 10*3/uL — ABNORMAL HIGH (ref 4.0–10.5)
nRBC: 0.1 % (ref 0.0–0.2)

## 2019-06-07 MED ORDER — CARVEDILOL 6.25 MG PO TABS: 6.2500 mg | ORAL_TABLET | Freq: Two times a day (BID) | ORAL | Status: DC

## 2019-06-07 MED ORDER — SODIUM CHLORIDE 0.9% FLUSH
10.0000 mL | Freq: Two times a day (BID) | INTRAVENOUS | Status: DC
  Administered 2019-06-07 – 2019-06-10 (×7): 10 mL

## 2019-06-07 MED ORDER — SODIUM CHLORIDE 0.9% FLUSH: 10.0000 mL | INTRAVENOUS | Status: DC | PRN

## 2019-06-07 MED ORDER — LEVALBUTEROL HCL 0.63 MG/3ML IN NEBU
0.6300 mg | INHALATION_SOLUTION | Freq: Three times a day (TID) | RESPIRATORY_TRACT | Status: DC | PRN
  Administered 2019-06-07 – 2019-06-08 (×2): 0.63 mg via RESPIRATORY_TRACT
  Filled 2019-06-07 (×2): qty 3

## 2019-06-07 MED ORDER — EZETIMIBE 10 MG PO TABS
10.0000 mg | ORAL_TABLET | Freq: Every day | ORAL | Status: DC
  Administered 2019-06-07 – 2019-06-10 (×4): 10 mg via ORAL
  Filled 2019-06-07 (×4): qty 1

## 2019-06-07 MED ORDER — CARVEDILOL 12.5 MG PO TABS
12.5000 mg | ORAL_TABLET | Freq: Two times a day (BID) | ORAL | Status: DC
  Administered 2019-06-07 – 2019-06-11 (×9): 12.5 mg via ORAL
  Filled 2019-06-07 (×9): qty 1

## 2019-06-07 MED ORDER — INSULIN ASPART 100 UNIT/ML ~~LOC~~ SOLN
0.0000 [IU] | Freq: Three times a day (TID) | SUBCUTANEOUS | Status: DC
  Administered 2019-06-07: 2 [IU] via SUBCUTANEOUS
  Administered 2019-06-07: 12:00:00 3 [IU] via SUBCUTANEOUS
  Administered 2019-06-08: 07:00:00 2 [IU] via SUBCUTANEOUS

## 2019-06-07 MED ORDER — POLYETHYLENE GLYCOL 3350 17 G PO PACK
17.0000 g | PACK | Freq: Two times a day (BID) | ORAL | Status: DC
  Administered 2019-06-07 – 2019-06-08 (×2): 17 g via ORAL
  Filled 2019-06-07 (×4): qty 1

## 2019-06-07 MED ORDER — DIPHENHYDRAMINE HCL 25 MG PO CAPS
25.0000 mg | ORAL_CAPSULE | Freq: Every evening | ORAL | Status: DC | PRN
  Administered 2019-06-08 – 2019-06-10 (×3): 25 mg via ORAL
  Filled 2019-06-07 (×3): qty 1

## 2019-06-07 MED ORDER — CLOPIDOGREL BISULFATE 75 MG PO TABS
75.0000 mg | ORAL_TABLET | Freq: Once | ORAL | Status: AC
  Filled 2019-06-07: qty 1

## 2019-06-07 MED ORDER — CLOPIDOGREL BISULFATE 75 MG PO TABS
75.0000 mg | ORAL_TABLET | Freq: Every day | ORAL | Status: DC
  Administered 2019-06-07 – 2019-06-11 (×5): 75 mg via ORAL
  Filled 2019-06-07 (×4): qty 1

## 2019-06-07 MED ORDER — INSULIN ASPART 100 UNIT/ML ~~LOC~~ SOLN: 0.0000 [IU] | Freq: Every day | SUBCUTANEOUS | Status: DC

## 2019-06-07 NOTE — Progress Notes (Signed)
Peripherally Inserted Central Catheter/Midline Placement  The IV Nurse has discussed with the patient and/or persons authorized to consent for the patient, the purpose of this procedure and the potential benefits and risks involved with this procedure.  The benefits include less needle sticks, lab draws from the catheter, and the patient may be discharged home with the catheter. Risks include, but not limited to, infection, bleeding, blood clot (thrombus formation), and puncture of an artery; nerve damage and irregular heartbeat and possibility to perform a PICC exchange if needed/ordered by physician.  Alternatives to this procedure were also discussed.  Bard Power PICC patient education guide, fact sheet on infection prevention and patient information card has been provided to patient /or left at bedside.    PICC/Midline Placement Documentation  PICC Double Lumen [id number] PICC Right Basilic 44 cm 0 cm (Active)  Indication for Insertion or Continuance of Line Prolonged intravenous therapies 06/07/19 1818  Exposed Catheter (cm) 0 cm 06/07/19 1818  Site Assessment Clean;Dry;Intact 06/07/19 1818  Lumen #1 Status Flushed;Blood return noted;Saline locked 06/07/19 1818  Lumen #2 Status Flushed;Blood return noted;Saline locked 06/07/19 1818  Dressing Type Transparent 06/07/19 1818  Dressing Status Clean;Dry;Intact;Antimicrobial disc in place 06/07/19 1818  Dressing Change Due 06/14/19 06/07/19 1818       Scotty Court 06/07/2019, 6:19 PM

## 2019-06-07 NOTE — Progress Notes (Addendum)
[address]       [place] [address]             [phone]      2 Days Post-Op Procedure(s) (LRB): CORONARY ARTERY BYPASS GRAFTING (CABG) x 3 WITH ENDOSCOPIC HARVESTING OF RIGHT GREATER SAPHENOUS VEIN. LIMA TO LAD. (N/A) TRANSESOPHAGEAL ECHOCARDIOGRAM (TEE) (N/A) Subjective: Feels pretty well overall  Objective: Vital signs in last 24 hours: Temp:  [98.4 F (36.9 C)-99.7 F (37.6 C)] 98.4 F (36.9 C) (09/11 0800) Pulse Rate:  [80-93] 85 (09/11 0800) Cardiac Rhythm: Normal sinus rhythm (09/11 0800) Resp:  [10-27] 26 (09/11 0800) BP: (114-174)/(59-85) 153/68 (09/11 0800) SpO2:  [87 %-96 %] 95 % (09/11 0800) Arterial Line BP: (133-177)/(44-57) 164/56 (09/10 1300) Weight:  [113.3 kg] 113.3 kg (09/11 0500)  Hemodynamic parameters for last 24 hours: PAP: (28)/(11) 28/11  Intake/Output from previous day: 09/10 0701 - 09/11 0700 In: 773.6 [P.O.:360; I.V.:413.6] Out: 1530 [Urine:1340; Chest Tube:190] Intake/Output this shift: Total I/O In: 1.8 [I.V.:1.8] Out: 90 [Urine:60; Chest Tube:30]  General appearance: alert, cooperative and no distress Heart: regular rate and rhythm Lungs: dim right lower field, fair air exchange otherwise Abdomen: benign Extremities: no significant edema Wound: evh site healing well, chest dressing in place  Lab Results: Recent Labs    06/06/19 1607 06/07/19 0507  WBC 19.3* 18.5*  HGB 10.1* 9.4*  HCT 29.2* 28.2*  PLT 131* 132*   BMET:  Recent Labs    06/06/19 1607 06/07/19 0507  NA 133* 132*  K 4.8 4.8  CL 100 100  CO2 23 24  GLUCOSE 152* 157*  BUN 23 28*  CREATININE 1.76* 1.81*  CALCIUM 8.2* 8.4*    PT/INR:  Recent Labs    06/05/19 1414  LABPROT 17.1*  INR 1.4*   ABG    Component Value Date/Time   PHART 7.381 06/05/2019 2010   HCO3 22.1 06/05/2019 2010   TCO2 23 06/05/2019 2010   ACIDBASEDEF 2.0 06/05/2019 2010   O2SAT 54.1 06/07/2019 0512   CBG (last 3)  Recent Labs    06/06/19 2025  06/07/19 0006 06/07/19 0425  GLUCAP 158* 136* 147*    Meds Scheduled Meds: . acetaminophen  1,000 mg Oral Q6H   Or  . acetaminophen (TYLENOL) oral liquid 160 mg/5 mL  1,000 mg Per Tube Q6H  . aspirin EC  325 mg Oral Daily   Or  . aspirin  324 mg Per Tube Daily  . atorvastatin  80 mg Oral q1800  . bisacodyl  10 mg Oral Daily   Or  . bisacodyl  10 mg Rectal Daily  . carvedilol  12.5 mg Oral BID WC  . Chlorhexidine Gluconate Cloth  6 each Topical Daily  . docusate sodium  200 mg Oral Daily  . ezetimibe  10 mg Oral QHS  . insulin aspart  0-15 Units Subcutaneous TID WC  . insulin aspart  0-5 Units Subcutaneous QHS  . insulin glargine  10 Units Subcutaneous BID  . pantoprazole  40 mg Oral Daily  . polyethylene glycol  17 g Oral BID  . sodium chloride flush  10-40 mL Intracatheter Q12H  . sodium chloride flush  3 mL Intravenous Q12H   Continuous Infusions: . lactated ringers    . lactated ringers Stopped (06/07/19 [id number])  . milrinone 0.125 mcg/kg/min (06/07/19 0800)   PRN Meds:.influenza vaccine adjuvanted, morphine injection, ondansetron (ZOFRAN) IV, oxyCODONE, sodium chloride flush, sodium chloride flush  Xrays Dg Chest [facility] 1 [address]  Result Date: 06/06/2019 CLINICAL DATA:  Follow-up chest tube EXAM: PORTABLE CHEST 1 VIEW COMPARISON:  06/05/2019 FINDINGS: Cardiac shadow is mildly enlarged. Swan-Ganz catheter mediastinal drain and left thoracostomy catheter are again seen. Endotracheal tube and gastric catheter have been removed in the interval. No pneumothorax is seen. Elevation of the right hemidiaphragm with small right effusion is again noted. No other focal infiltrate is seen. IMPRESSION: Small right-sided effusion with elevation of the hemidiaphragm. Tubes and lines as described. Electronically Signed   By: Inez Catalina M.D.   On: 06/06/2019 07:27   Dg Chest Port 1 View  Result Date: 06/05/2019 CLINICAL DATA:  Postop CABG EXAM: PORTABLE CHEST 1 VIEW COMPARISON:  06/04/2019  FINDINGS: Interval sternotomy changes. Endotracheal tube tip is about 4.9 cm superior to the carina. Esophageal tube tip in the left upper quadrant. Right IJ Swan-Ganz catheter tip over the pulmonary outflow tract. Midline and left-sided chest tubes are in place. No pneumothorax. Suspected bilateral pleural effusions. Right greater than left basilar airspace disease. Borderline enlargement of the heart size. Mild central congestion. IMPRESSION: 1. Interval sternotomy changes. Placement of support lines and tubes as above. Endotracheal tube tip about 4.9 cm superior to carina 2. Development of suspected bilateral pleural effusions, likely layering on the right, and basilar airspace disease. 3. Borderline cardiomegaly with central vascular congestion. Electronically Signed   By: Donavan Foil M.D.   On: 06/05/2019 14:56   [place] Ekg Site Rite  Result Date: 06/07/2019 If Site Rite image not attached, placement could not be confirmed due to current cardiac rhythm.   Assessment/Plan: S/P Procedure(s) (LRB): CORONARY ARTERY BYPASS GRAFTING (CABG) x 3 WITH ENDOSCOPIC HARVESTING OF RIGHT GREATER SAPHENOUS VEIN. LIMA TO LAD. (N/A) TRANSESOPHAGEAL ECHOCARDIOGRAM (TEE) (N/A)  1 doing well overall 2 BP is pretty variable, cont coreg, ACE-I on hold for rising creat Co-OX 54 , conts milrinone for now 3 good UOP, monitoring renal fxn  4 CT's , foley to be removed 5 sugars adeq controlled on current RX, no DM hx/meds preop 6 minor pseudo-hyponatremia. Monitor 7 leukocytosis trend improving, tmax 99.7- monitor for clinical evidence of infection 8 poss right elev hemidiaphragm vs effusion, may need sniff test to confirm 9 ABL anemia is pretty stable 10 thrombocytopenia is stable 11 start plavix since tubes being removed 12 routine pulm toilet and rehab  LOS: 6 days    John Giovanni [facility] 06/07/2019 Pager [phone]  With preop MI, low coox and rising creatinine will cont low dose iv milrinone. Transfer to 2  C  patient examined and medical record reviewed,agree with above note. Tharon Aquas Trigt III 06/07/2019

## 2019-06-07 NOTE — Progress Notes (Signed)
Pt transferred to 2C2 with tech, pt arrive safely and was put in bed. Handoff given to RN Ranier [employer]

## 2019-06-07 NOTE — Progress Notes (Signed)
CARDIAC REHAB PHASE I   PRE:  Rate/Rhythm: 85 SR    BP: sitting 163/78    SaO2: 99 4L bed  MODE:  Ambulation: 370 ft   POST:  Rate/Rhythm: 95 SR    BP: sitting 178/73     SaO2: 92 6L  Pt moved to EOB with mod assist. Stood independently. SaO2 decreased for a few minutes standing, 84 4L at lowest. Encouraged deep breathing but ultimately increased O2 to 6L for ambulation.  Pt used EVA and 6L, assist x2 for safety. Pts legs somewhat wobbly at times. SaO2 92-95 6L during walk. Slow pace but increased distance. To recliner, BP elevated. Getting ready to tx to [place]. Encouraged walking x3 qd and also IS. Will f/u tomorrow. [place], ACSM 06/07/2019 2:27 PM

## 2019-06-07 NOTE — Discharge Summary (Addendum)
Physician Discharge Summary  Patient ID: ZYMIRE MIKES MRN: [id number] DOB/AGE: [birth year] 68 y.o.  Admit date: 06/01/2019 Discharge date: 06/13/2019  Admission Diagnoses: Acute STEMI  Discharge Diagnoses:  Principal Problem:   Acute ST elevation myocardial infarction (STEMI) of inferior wall (HCC) Active Problems:   Coronary artery disease   Benign essential hypertension   Hyperlipemia, mixed   Acute kidney injury superimposed on chronic kidney disease (HCC)   Hyperkalemia   S/P CABG x 3   Patient Active Problem List   Diagnosis Date Noted  . S/P CABG x 3 06/05/2019  . Acute ST elevation myocardial infarction (STEMI) of inferior wall ([place]) 06/01/2019  . STEMI involving right coronary artery ([place]) 06/01/2019  . Acute kidney injury superimposed on chronic kidney disease ([place])   . Hyperkalemia   . Advanced care planning/counseling discussion 10/15/2018  . Dupuytren's contracture of both hands 10/05/2017  . Seborrheic keratosis 10/05/2017  . Benign neoplasm of cecum   . Hx of colonic polyps   . Diverticulosis of large intestine without diverticulitis   . First degree hemorrhoids   . BPH (benign prostatic hyperplasia) 09/07/2016  . Non-ST elevation myocardial infarction (NSTEMI), subendocardial infarction, subsequent episode of care ([place]) 09/10/2015  . Benign essential hypertension 02/12/2015  . LVH (left ventricular hypertrophy) due to hypertensive disease, without heart failure 08/27/2014  . Coronary artery disease 02/07/2014  . Hyperlipemia, mixed 02/07/2014   History of the present illness:  Patient is a 68 year old male with history of previous inferior STEMI in 2005 treated at that time with a stent.  He also has a history of hypertension and hyperlipidemia.  He presented to [facility] with chest pain.  This was similar to the symptoms he had had previously in 2005 but also had some radiation to his shoulders.  Prior to that he describes himself as  physically active with no symptoms of chest pain or dyspnea on exertion.  He does not have a history of diabetes.  Upon arrival to [facility] STEMI activation was set into play.  Underwent catheterization was found to have a very calcified and tortuous proximal and mid RCA that prevented PCI.  Balloon pump was placed and he was sent to [facility] for consideration of emergency surgery.  The patient was given in the Cath Lab he received IV bivalirudin, IV Aggrastat, and aspirin.  He was admitted for further evaluation and treatment to include cardiothoracic surgical consultation. Discharged Condition: good  Hospital Course: The patient was admitted in transfer from [facility] regional hospital to the cardiology service and was medically stabilized.  He was seen in cardiothoracic surgical consultation by Dr. Darcey Nora who evaluated the patient and studies and agree with recommendations to proceed with surgical revascularization.  He did have a short-term of Brilinta washout.  But his platelet studies were acceptable to proceed with surgery.  On 06/05/2019 he was taken to the operating room where he underwent the below described procedure.  He tolerated it well and was taken to the surgical intensive care unit in stable condition.  Postoperative hospital course:  The patient has done well.  He was extubated from the ventilator using standard protocols without difficulty.  He has required some inotropic support with milrinone which has been weaned slowly over the first days of  postoperative course.  All routine lines, monitors and drainage devices have been discontinued in the standard fashion.  He has been started on Plavix due to the recent STEMI.  He has had some postoperative hypertension  but is somewhat limited in medications due to acute kidney injury in the setting of chronic kidney disease stage III.  His creatinine has been monitored closely during the postoperative period and urine output has been good.   Additionally, the patient does have an acute blood loss anemia which has stabilized.  He did have an episode of postoperative atrial fibrillation with no recurrence.  Blood sugars have been under good control using standard protocols.  He does not have a history of diabetes and was on no medications preoperatively.  All incisions are noted to be healing well without evidence of infection.  He is tolerating gradually increasing activities using standard cardiac rehab protocols.  Oxygen has been weaned and he maintains good saturations on room air.  At the time of discharge the patient is felt to be quite stable.  Consults: cardiology  Significant Diagnostic Studies: angiography: Cardiac catheterization, echocardiogram.   Treatments: surgery:  OPERATIVE REPORT  DATE OF PROCEDURE:  06/05/2019  OPERATION: 1.  Coronary artery bypass grafting x3 (left internal mammary artery to the left anterior descending, saphenous vein graft to diagonal, saphenous vein graft to posterior descending). 2.  Endoscopic harvest of right leg greater saphenous vein.  SURGEON:  Len Childs, MD  ASSISTANT:  Jadene Pierini, PA-C  ANESTHESIA:  General.  PREOPERATIVE DIAGNOSES:  Status post non-ST elevation myocardial infarction with unstable angina, multivessel coronary artery disease.  POSTOPERATIVE DIAGNOSES:  Status post non-ST elevation myocardial infarction with unstable angina, multivessel coronary artery disease. Discharge Exam: Blood pressure (!) 152/65, pulse 96, temperature 98.8 F (37.1 C), temperature source Oral, resp. rate 17, height 6' (1.829 m), weight 104.6 kg, SpO2 97 %.  General appearance: alert, cooperative and no distress Heart: regular rate and rhythm Lungs: clear to auscultation bilaterally Abdomen: benign Extremities: trace edema Wound: incis healing well  Disposition: Discharge disposition: 01-Home or Self Care      Discharge Instructions    Amb Referral to Cardiac  Rehabilitation   Complete by: As directed    Diagnosis:  CABG STEMI     CABG X ___: 3   After initial evaluation and assessments completed: Virtual Based Care may be provided alone or in conjunction with Phase 2 Cardiac Rehab based on patient barriers.: Yes   Discharge patient   Complete by: As directed    Discharge disposition: 01-Home or Self Care   Discharge patient date: 06/11/2019     Allergies as of 06/11/2019   No Known Allergies     Medication List    STOP taking these medications   diphenhydramine-acetaminophen 25-500 MG Tabs tablet Commonly known as: TYLENOL PM   lisinopril 40 MG tablet Commonly known as: ZESTRIL   meloxicam 7.5 MG tablet Commonly known as: MOBIC   nitroGLYCERIN 0.4 MG SL tablet Commonly known as: NITROSTAT     TAKE these medications   amLODipine 10 MG tablet Commonly known as: NORVASC Take 1 tablet (10 mg total) by mouth daily.   aspirin EC 81 MG EC tablet Generic drug: aspirin Take 81 mg by mouth daily. Swallow whole.   atorvastatin 80 MG tablet Commonly known as: LIPITOR Take 1 tablet (80 mg total) by mouth daily at 6 PM. What changed:   medication strength  how much to take   carvedilol 12.5 MG tablet Commonly known as: COREG Take 1 tablet (12.5 mg total) by mouth 2 (two) times daily with a meal. What changed:   medication strength  how much to take   clopidogrel 75 MG  tablet Commonly known as: PLAVIX Take 1 tablet (75 mg total) by mouth daily.   ezetimibe 10 MG tablet Commonly known as: ZETIA Take 1 tablet (10 mg total) by mouth daily. What changed: when to take this   ferrous sulfate 325 (65 FE) MG tablet Take 1 tablet (325 mg total) by mouth daily with breakfast.   FIBER PO Take 2 tablets by mouth 2 (two) times daily.   folic acid 1 MG tablet Commonly known as: FOLVITE Take 1 tablet (1 mg total) by mouth daily.   oxyCODONE 5 MG immediate release tablet Commonly known as: Oxy IR/ROXICODONE Take 1-2 tablets  (5-10 mg total) by mouth every 6 (six) hours as needed for up to 7 days for severe pain.   PROBIOTIC ACIDOPHILUS PO Take 1 capsule by mouth at bedtime.      Follow-up Information    Ivin Poot, MD Follow up.   Specialty: Cardiothoracic Surgery Why: Please see discharge paperwork for follow-up with surgeon.  Obtain a chest x-ray at [place] 1/2-hour prior to the appointment.  It is in the same office complex. Contact information: [address] [place] [address] [phone]        Yolonda Kida, MD Follow up.   Specialties: Cardiology, Internal Medicine Why: Appointment to see Dr. Towanda Malkin on Thursday June 20, 2019 at 10 AM. Contact information: [place] [address] [phone]         The patient has been discharged on:   1.Beta Blocker:  Yes [ y  ]                              No   [   ]                              If No, reason:  2.Ace Inhibitor/ARB: Yes [   ]                                     No  [ n   ]                                     If No, reason:renal insuff  3.Statin:   Yes [  y ]                  No  [   ]                  If No, reason:  4.Ecasa:  Yes  Blue.Reese   ]                  No   [   ]                  If No, reason:   Signed: Wilder Glade Gold PA-C 06/13/2019, 12:20 PM   patient examined and medical record reviewed,agree with above note. Tharon Aquas Trigt III 06/13/2019

## 2019-06-07 NOTE — Progress Notes (Signed)
Progress Note  Patient Name: Andre Gallegos Date of Encounter: 06/07/2019  Primary Cardiologist: No primary care provider on file.   Subjective   Up in chair. Feeling better. Expected chest soreness. No dyspnea.   Inpatient Medications    Scheduled Meds: . acetaminophen  1,000 mg Oral Q6H   Or  . acetaminophen (TYLENOL) oral liquid 160 mg/5 mL  1,000 mg Per Tube Q6H  . aspirin EC  325 mg Oral Daily   Or  . aspirin  324 mg Per Tube Daily  . atorvastatin  80 mg Oral q1800  . bisacodyl  10 mg Oral Daily   Or  . bisacodyl  10 mg Rectal Daily  . Chlorhexidine Gluconate Cloth  6 each Topical Daily  . docusate sodium  200 mg Oral Daily  . furosemide  20 mg Intravenous BID  . insulin aspart  0-24 Units Subcutaneous Q4H  . insulin glargine  10 Units Subcutaneous BID  . metoprolol tartrate  12.5 mg Oral BID   Or  . metoprolol tartrate  12.5 mg Per Tube BID  . pantoprazole  40 mg Oral Daily  . sodium chloride flush  10-40 mL Intracatheter Q12H  . sodium chloride flush  3 mL Intravenous Q12H   Continuous Infusions: . lactated ringers    . lactated ringers    . lactated ringers 10 mL/hr at 06/07/19 0500  . milrinone 0.125 mcg/kg/min (06/07/19 0500)  . nitroGLYCERIN Stopped (06/06/19 1612)  . phenylephrine (NEO-SYNEPHRINE) Adult infusion Stopped (06/05/19 2002)   PRN Meds: influenza vaccine adjuvanted, lactated ringers, metoprolol tartrate, morphine injection, ondansetron (ZOFRAN) IV, oxyCODONE, sodium chloride flush, sodium chloride flush, traMADol   Vital Signs    Vitals:   06/07/19 0300 06/07/19 0400 06/07/19 0500 06/07/19 0515  BP: (!) 174/64 (!) 148/82 (!) 151/71   Pulse: 93 85 91 91  Resp: (!) 23 12 (!) 26 (!) 24  Temp:  98.6 F (37 C)    TempSrc:  Oral    SpO2: 92% 92% 92% 92%  Weight:      Height:        Intake/Output Summary (Last 24 hours) at 06/07/2019 0740 Last data filed at 06/07/2019 0500 Gross per 24 hour  Intake 758.11 ml  Output 1230 ml  Net  -471.89 ml   Last 3 Weights 06/06/2019 06/04/2019 06/02/2019  Weight (lbs) 217 lb 9.6 oz 234 lb 12.6 oz 235 lb 3.7 oz  Weight (kg) 98.703 kg 106.5 kg 106.7 kg      Telemetry    NSR with few PVC's - Personally Reviewed   Physical Exam  Alert, oriented, in NAD GEN: No acute distress.   Neck: No JVD Cardiac: RRR, no murmurs, rubs, or gallops. Chest tube still in place. Respiratory: Clear to auscultation bilaterally. GI: Soft, nontender, non-distended  MS: No edema; No deformity. Neuro:  Nonfocal  Psych: Normal affect   Labs    High Sensitivity Troponin:   Recent Labs  Lab 06/01/19 1330 06/01/19 1611 06/03/19 1435  TROPONINIHS 8 2,089* 7,033*      Chemistry Recent Labs  Lab 06/03/19 0246 06/04/19 0233  06/06/19 0422 06/06/19 1607 06/07/19 0507  NA 137 137   < > 135 133* 132*  K 4.4 4.3   < > 4.5 4.8 4.8  CL 108 107   < > 105 100 100  CO2 21* 22   < > 22 23 24   GLUCOSE 114* 113*   < > 114* 152* 157*  BUN 21 23   < >  19 23 28*  CREATININE 1.56* 1.45*   < > 1.68* 1.76* 1.81*  CALCIUM 9.0 8.9   < > 8.1* 8.2* 8.4*  PROT 5.9* 6.0*  --   --   --   --   ALBUMIN 3.6 3.6  --   --   --   --   AST 63* 38  --   --   --   --   ALT 37 39  --   --   --   --   ALKPHOS 52 53  --   --   --   --   BILITOT 1.1 0.8  --   --   --   --   GFRNONAA 45* 49*   < > 41* 39* 38*  GFRAA 52* 57*   < > 48* 45* 44*  ANIONGAP 8 8   < > 8 10 8    < > = values in this interval not displayed.     Hematology Recent Labs  Lab 06/06/19 0422 06/06/19 1607 06/07/19 0507  WBC 11.5* 19.3* 18.5*  RBC 2.88* 3.10* 2.95*  HGB 9.2* 10.1* 9.4*  HCT 27.1* 29.2* 28.2*  MCV 94.1 94.2 95.6  MCH 31.9 32.6 31.9  MCHC 33.9 34.6 33.3  RDW 12.6 13.0 13.1  PLT 106* 131* 132*    BNP Recent Labs  Lab 06/01/19 2102  BNP 66.6     DDimer No results for input(s): DDIMER in the last 168 hours.   Radiology    Dg Chest Port 1 View  Result Date: 06/06/2019 CLINICAL DATA:  Follow-up chest tube EXAM:  PORTABLE CHEST 1 VIEW COMPARISON:  06/05/2019 FINDINGS: Cardiac shadow is mildly enlarged. Swan-Ganz catheter mediastinal drain and left thoracostomy catheter are again seen. Endotracheal tube and gastric catheter have been removed in the interval. No pneumothorax is seen. Elevation of the right hemidiaphragm with small right effusion is again noted. No other focal infiltrate is seen. IMPRESSION: Small right-sided effusion with elevation of the hemidiaphragm. Tubes and lines as described. Electronically Signed   By: Inez Catalina M.D.   On: 06/06/2019 07:27   Dg Chest Port 1 View  Result Date: 06/05/2019 CLINICAL DATA:  Postop CABG EXAM: PORTABLE CHEST 1 VIEW COMPARISON:  06/04/2019 FINDINGS: Interval sternotomy changes. Endotracheal tube tip is about 4.9 cm superior to the carina. Esophageal tube tip in the left upper quadrant. Right IJ Swan-Ganz catheter tip over the pulmonary outflow tract. Midline and left-sided chest tubes are in place. No pneumothorax. Suspected bilateral pleural effusions. Right greater than left basilar airspace disease. Borderline enlargement of the heart size. Mild central congestion. IMPRESSION: 1. Interval sternotomy changes. Placement of support lines and tubes as above. Endotracheal tube tip about 4.9 cm superior to carina 2. Development of suspected bilateral pleural effusions, likely layering on the right, and basilar airspace disease. 3. Borderline cardiomegaly with central vascular congestion. Electronically Signed   By: Donavan Foil M.D.   On: 06/05/2019 14:56    Cardiac Studies   Echo 06/02/2019: IMPRESSIONS    1. The left ventricle has mild-moderately reduced systolic function, with an ejection fraction of 40-45%. Akinesis of basal to apical inferior wall.The cavity size was normal. There is mild concentric left ventricular hypertrophy. Left ventricular  diastolic parameters were normal.  2. The right ventricle has normal systolic function. The cavity was normal.  There is no increase in right ventricular wall thickness.  3. Small pericardial effusion, primarily adjacent to RV free wall  Patient Profile  68 y.o. male with inferior STEMI, initial attempts at PCI unsuccessful, transferred to [facility] for CABG.  Patient underwent multivessel CABG 06/05/2019  Assessment & Plan    1.  Inferior STEMI with multivessel CAD: Now status post CABG postoperative day #2. Remains on milrinone, but BP stable/elevated. Progressing well. Start plavix once tubes/lines out.   2.  Hypertension: was on amlodipine, coreg, and lisinopril at home. Would stop metoprolol and resume coreg at dose of 6.25 mg BID. Hold ACE for AKI.   3.  AKI on CKD stage III: Maintaining adequate urine output. Creatinine up slightly from yesterday 1.81 today.  4.  Expected postoperative blood loss anemia, stable  5.  Postoperative atrial fibrillation: no recurrence.   6. Hyperlipidemia: resume zetia 10 mg.  For questions or updates, please contact [place] Please consult [email or web address] for contact info under        Signed, Sherren Mocha, MD  06/07/2019, 7:40 AM

## 2019-06-08 ENCOUNTER — Inpatient Hospital Stay (HOSPITAL_COMMUNITY): Payer: Medicare Other

## 2019-06-08 DIAGNOSIS — Z951 Presence of aortocoronary bypass graft: Secondary | ICD-10-CM

## 2019-06-08 LAB — CBC
HCT: 24.9 % — ABNORMAL LOW (ref 39.0–52.0)
Hemoglobin: 8.3 g/dL — ABNORMAL LOW (ref 13.0–17.0)
MCH: 31.4 pg (ref 26.0–34.0)
MCHC: 33.3 g/dL (ref 30.0–36.0)
MCV: 94.3 fL (ref 80.0–100.0)
Platelets: 126 10*3/uL — ABNORMAL LOW (ref 150–400)
RBC: 2.64 MIL/uL — ABNORMAL LOW (ref 4.22–5.81)
RDW: 12.8 % (ref 11.5–15.5)
WBC: 11.1 10*3/uL — ABNORMAL HIGH (ref 4.0–10.5)
nRBC: 0 % (ref 0.0–0.2)

## 2019-06-08 LAB — GLUCOSE, CAPILLARY
Glucose-Capillary: 126 mg/dL — ABNORMAL HIGH (ref 70–99)
Glucose-Capillary: 142 mg/dL — ABNORMAL HIGH (ref 70–99)
Glucose-Capillary: 151 mg/dL — ABNORMAL HIGH (ref 70–99)
Glucose-Capillary: 121 mg/dL — ABNORMAL HIGH (ref 70–99)

## 2019-06-08 LAB — BPAM RBC
Blood Product Expiration Date: 202010012359
Blood Product Expiration Date: 202010022359
Blood Product Expiration Date: 202010022359
Blood Product Expiration Date: 202010022359
ISSUE DATE / TIME: 202009082009
ISSUE DATE / TIME: 202009082009
ISSUE DATE / TIME: 202009090942
ISSUE DATE / TIME: 202009090942
Unit Type and Rh: 6200
Unit Type and Rh: 6200
Unit Type and Rh: 6200
Unit Type and Rh: 6200

## 2019-06-08 LAB — TYPE AND SCREEN
ABO/RH(D): A POS
Antibody Screen: NEGATIVE
Unit division: 0
Unit division: 0
Unit division: 0
Unit division: 0

## 2019-06-08 LAB — BASIC METABOLIC PANEL
Anion gap: 7 (ref 5–15)
BUN: 28 mg/dL — ABNORMAL HIGH (ref 8–23)
CO2: 26 mmol/L (ref 22–32)
Calcium: 8.5 mg/dL — ABNORMAL LOW (ref 8.9–10.3)
Chloride: 99 mmol/L (ref 98–111)
Creatinine, Ser: 1.69 mg/dL — ABNORMAL HIGH (ref 0.61–1.24)
GFR calc Af Amer: 48 mL/min — ABNORMAL LOW (ref >60)
GFR calc non Af Amer: 41 mL/min — ABNORMAL LOW (ref >60)
Glucose, Bld: 147 mg/dL — ABNORMAL HIGH (ref 70–99)
Potassium: 4.3 mmol/L (ref 3.5–5.1)
Sodium: 132 mmol/L — ABNORMAL LOW (ref 135–145)

## 2019-06-08 LAB — COOXEMETRY PANEL
Carboxyhemoglobin: 1.8 % — ABNORMAL HIGH (ref 0.5–1.5)
Methemoglobin: 1.4 % (ref 0.0–1.5)
O2 Saturation: 75.7 %
Total hemoglobin: 8.6 g/dL — ABNORMAL LOW (ref 12.0–16.0)

## 2019-06-08 MED ORDER — LEVALBUTEROL HCL 0.63 MG/3ML IN NEBU
0.6300 mg | INHALATION_SOLUTION | RESPIRATORY_TRACT | Status: DC | PRN
  Administered 2019-06-08 – 2019-06-10 (×2): 0.63 mg via RESPIRATORY_TRACT
  Filled 2019-06-08: qty 3

## 2019-06-08 MED ORDER — FOLIC ACID 1 MG PO TABS
1.0000 mg | ORAL_TABLET | Freq: Every day | ORAL | Status: DC
  Administered 2019-06-08 – 2019-06-11 (×4): 1 mg via ORAL
  Filled 2019-06-08 (×4): qty 1

## 2019-06-08 MED ORDER — AMLODIPINE BESYLATE 5 MG PO TABS
5.0000 mg | ORAL_TABLET | Freq: Every day | ORAL | Status: DC
  Administered 2019-06-08 – 2019-06-11 (×4): 5 mg via ORAL
  Filled 2019-06-08 (×5): qty 1

## 2019-06-08 MED ORDER — FERROUS SULFATE 325 (65 FE) MG PO TABS
325.0000 mg | ORAL_TABLET | Freq: Every day | ORAL | Status: DC
  Administered 2019-06-08 – 2019-06-11 (×5): 325 mg via ORAL
  Filled 2019-06-08 (×4): qty 1

## 2019-06-08 MED ORDER — LEVALBUTEROL HCL 0.63 MG/3ML IN NEBU
0.6300 mg | INHALATION_SOLUTION | RESPIRATORY_TRACT | Status: DC
  Administered 2019-06-08 – 2019-06-09 (×8): 0.63 mg via RESPIRATORY_TRACT
  Filled 2019-06-08 (×8): qty 3

## 2019-06-08 MED ORDER — GUAIFENESIN ER 600 MG PO TB12
600.0000 mg | ORAL_TABLET | Freq: Two times a day (BID) | ORAL | Status: DC
  Administered 2019-06-08 (×2): 600 mg via ORAL
  Filled 2019-06-08 (×2): qty 1

## 2019-06-08 MED ORDER — FUROSEMIDE 10 MG/ML IJ SOLN
40.0000 mg | Freq: Once | INTRAMUSCULAR | Status: AC
  Administered 2019-06-08: 40 mg via INTRAVENOUS
  Filled 2019-06-08: qty 4

## 2019-06-08 MED ORDER — CHLORHEXIDINE GLUCONATE CLOTH 2 % EX PADS
6.0000 | MEDICATED_PAD | Freq: Every day | CUTANEOUS | Status: DC
  Administered 2019-06-11: 6 via TOPICAL

## 2019-06-08 NOTE — Plan of Care (Signed)
  Problem: Education: Goal: Knowledge of General Education information will improve Description: Including pain rating scale, medication(s)/side effects and non-pharmacologic comfort measures Outcome: Progressing   Problem: Elimination: Goal: Will not experience complications related to bowel motility Outcome: Progressing   Problem: Pain Managment: Goal: General experience of comfort will improve Outcome: Progressing   Problem: Education: Goal: Understanding of CV disease, CV risk reduction, and recovery process will improve Outcome: Progressing   Problem: Cardiovascular: Goal: Ability to achieve and maintain adequate cardiovascular perfusion will improve Outcome: Progressing

## 2019-06-08 NOTE — Discharge Instructions (Signed)
Prediabetes Eating Plan Prediabetes is a condition that causes blood sugar (glucose) levels to be higher than normal. This increases the risk for developing diabetes. In order to prevent diabetes from developing, your health care provider may recommend a diet and other lifestyle changes to help you: Control your blood glucose levels. Improve your cholesterol levels. Manage your blood pressure. Your health care provider may recommend working with a diet and nutrition specialist (dietitian) to make a meal plan that is best for you. What are tips for following this plan? Lifestyle Set weight loss goals with the help of your health care team. It is recommended that most people with prediabetes lose 7% of their current body weight. Exercise for at least 30 minutes at least 5 days a week. Attend a support group or seek ongoing support from a mental health counselor. Take over-the-counter and prescription medicines only as told by your health care provider. Reading food labels Read food labels to check the amount of fat, salt (sodium), and sugar in prepackaged foods. Avoid foods that have: Saturated fats. Trans fats. Added sugars. Avoid foods that have more than 300 milligrams (mg) of sodium per serving. Limit your daily sodium intake to less than 2,300 mg each day. Shopping Avoid buying pre-made and processed foods. Cooking Cook with olive oil. Do not use butter, lard, or ghee. Bake, broil, grill, or boil foods. Avoid frying. Meal planning  Work with your dietitian to develop an eating plan that is right for you. This may include: Tracking how many calories you take in. Use a food diary, notebook, or mobile application to track what you eat at each meal. Using the glycemic index (GI) to plan your meals. The index tells you how quickly a food will raise your blood glucose. Choose low-GI foods. These foods take a longer time to raise blood glucose. Consider following a Mediterranean diet. This  diet includes: Several servings each day of fresh fruits and vegetables. Eating fish at least twice a week. Several servings each day of whole grains, beans, nuts, and seeds. Using olive oil instead of other fats. Moderate alcohol consumption. Eating small amounts of red meat and whole-fat dairy. If you have high blood pressure, you may need to limit your sodium intake or follow a diet such as the DASH eating plan. DASH is an eating plan that aims to lower high blood pressure. What foods are recommended? The items listed below may not be a complete list. Talk with your dietitian about what dietary choices are best for you. Grains Whole grains, such as whole-wheat or whole-grain breads, crackers, cereals, and pasta. Unsweetened oatmeal. Bulgur. Barley. Quinoa. Brown rice. Corn or whole-wheat flour tortillas or taco shells. Vegetables Lettuce. Spinach. Peas. Beets. Cauliflower. Cabbage. Broccoli. Carrots. Tomatoes. Squash. Eggplant. Herbs. Peppers. Onions. Cucumbers. Brussels sprouts. Fruits Berries. Bananas. Apples. Oranges. Grapes. Papaya. Mango. Pomegranate. Kiwi. Grapefruit. Cherries. Meats and other protein foods Seafood. Poultry without skin. Lean cuts of pork and beef. Tofu. Eggs. Nuts. Beans. Dairy Low-fat or fat-free dairy products, such as yogurt, cottage cheese, and cheese. Beverages Water. Tea. Coffee. Sugar-free or diet soda. Seltzer water. Lowfat or no-fat milk. Milk alternatives, such as soy or almond milk. Fats and oils Olive oil. Canola oil. Sunflower oil. Grapeseed oil. Avocado. Walnuts. Sweets and desserts Sugar-free or low-fat pudding. Sugar-free or low-fat ice cream and other frozen treats. Seasoning and other foods Herbs. Sodium-free spices. Mustard. Relish. Low-fat, low-sugar ketchup. Low-fat, low-sugar barbecue sauce. Low-fat or fat-free mayonnaise. What foods are not recommended? The  items listed below may not be a complete list. Talk with your dietitian about what  dietary choices are best for you. Grains Refined white flour and flour products, such as bread, pasta, snack foods, and cereals. Vegetables Canned vegetables. Frozen vegetables with butter or cream sauce. Fruits Fruits canned with syrup. Meats and other protein foods Fatty cuts of meat. Poultry with skin. Breaded or fried meat. Processed meats. Dairy Full-fat yogurt, cheese, or milk. Beverages Sweetened drinks, such as sweet iced tea and soda. Fats and oils Butter. Lard. Ghee. Sweets and desserts Baked goods, such as cake, cupcakes, pastries, cookies, and cheesecake. Seasoning and other foods Spice mixes with added salt. Ketchup. Barbecue sauce. Mayonnaise. Summary To prevent diabetes from developing, you may need to make diet and other lifestyle changes to help control blood sugar, improve cholesterol levels, and manage your blood pressure. Set weight loss goals with the help of your health care team. It is recommended that most people with prediabetes lose 7 percent of their current body weight. Consider following a Mediterranean diet that includes plenty of fresh fruits and vegetables, whole grains, beans, nuts, seeds, fish, lean meat, low-fat dairy, and healthy oils. This information is not intended to replace advice given to you by your health care provider. Make sure you discuss any questions you have with your health care provider. Document Released: 01/27/2015 Document Revised: 01/04/2019 Document Reviewed: 11/16/2016 Elsevier Patient Education  [place].  Coronary Artery Bypass Grafting, Care After This sheet gives you information about how to care for yourself after your procedure. Your doctor may also give you more specific instructions. If you have problems or questions, call your doctor. What can I expect after the procedure? After the procedure, it is common to:  Feel sick to your stomach (nauseous).  Not want to eat as much as normal (lack of  appetite).  Have trouble pooping (constipation).  Have weakness and tiredness (fatigue).  Feel sad (depressed) or grouchy (irritable).  Have pain or discomfort around the cuts from surgery (incisions). Follow these instructions at home: Medicines  Take over-the-counter and prescription medicines only as told by your doctor. Do not stop taking medicines or start any new medicines unless your doctor says it is okay.  If you were prescribed an antibiotic medicine, take it as told by your doctor. Do not stop taking the antibiotic even if you start to feel better. Incision care   Follow instructions from your doctor about how to take care of your cuts from surgery. Make sure you: ? Wash your hands with soap and water before and after you change your bandage (dressing). If you cannot use soap and water, use hand sanitizer. ? Change your bandage as told by your doctor. ? Leave stitches (sutures), skin glue, or skin tape (adhesive) strips in place. They may need to stay in place for 2 weeks or longer. If tape strips get loose and curl up, you may trim the loose edges. Do not remove tape strips completely unless your doctor says it is okay.  Make sure the surgery cuts are clean, dry, and protected.  Check your cut areas every day for signs of infection. Check for: ? More redness, swelling, or pain. ? More fluid or blood. ? Warmth. ? Pus or a bad smell.  If cuts were made in your legs: ? Avoid crossing your legs. ? Avoid sitting for long periods of time. Change positions every 30 minutes. ? Raise (elevate) your legs when you are sitting. Bathing  Do not take baths, swim, or use a hot tub until your doctor says it is okay. You may shower. Pat the surgery cuts dry. Do not rub the cuts to dry. Eating and drinking   Eat foods that are high in fiber, such as beans, nuts, whole grains, and raw fruits and vegetables. Any meats you eat should be lean cut. Avoid canned, processed, and fried  foods. This can help prevent trouble pooping. This is also a part of a heart-healthy diet.  Drink enough fluid to keep your pee (urine) pale yellow.  Do not drink alcohol until you are fully recovered. Ask your doctor when it is safe to drink alcohol. Activity  Rest and limit your activity as told by your doctor. You may be told to: ? Stop any activity right away if you have chest pain, shortness of breath, irregular heartbeats, or dizziness. Get help right away if you have any of these symptoms. ? Move around often for short periods or take short walks as told by your doctor. Slowly increase your activities. ? Avoid lifting, pushing, or pulling anything that is heavier than 10 lb (4.5 kg) for at least 6 weeks or as told by your doctor.  Do physical therapy or a cardiac rehab (cardiac rehabilitation) program as told by your doctor. ? Physical therapy involves doing exercises to maintain movement and build strength and endurance. ? A cardiac rehab program includes:  Exercise training.  Education.  Counseling.  Do not drive until your doctor says it is okay.  Ask your doctor when you can go back to work.  Ask your doctor when you can be sexually active. General instructions  Do not drive or use heavy machinery while taking prescription pain medicine.  Do not use any products that contain nicotine or tobacco. These include cigarettes, e-cigarettes, and chewing tobacco. If you need help quitting, ask your doctor.  Take 2-3 deep breaths every few hours during the day while you get better. This helps expand your lungs and prevent problems.  If you were given a device called an incentive spirometer, use it several times a day to practice deep breathing. Support your chest with a pillow or your arms when you take deep breaths or cough.  Wear compression stockings as told by your doctor.  Weigh yourself every day. This helps to see if your body is holding (retaining) fluid that may make  your heart and lungs work harder.  Keep all follow-up visits as told by your doctor. This is important. Contact a doctor if:  You have more redness, swelling, or pain around any cut.  You have more fluid or blood coming from any cut.  Any cut feels warm to the touch.  You have pus or a bad smell coming from any cut.  You have a fever.  You have swelling in your ankles or legs.  You have pain in your legs.  You gain 2 lb (0.9 kg) or more a day.  You feel sick to your stomach or you throw up (vomit).  You have watery poop (diarrhea). Get help right away if:  You have chest pain that goes to your jaw or arms.  You are short of breath.  You have a fast or irregular heartbeat.  You notice a "clicking" in your breastbone (sternum) when you move.  You have any signs of a stroke. "BE FAST" is an easy way to remember the main warning signs: ? B - Balance. Signs are dizziness, sudden trouble  walking, or loss of balance. ? E - Eyes. Signs are trouble seeing or a change in how you see. ? F - Face. Signs are sudden weakness or loss of feeling of the face, or the face or eyelid drooping on one side. ? A - Arms. Signs are weakness or loss of feeling in an arm. This happens suddenly and usually on one side of the body. ? S - Speech. Signs are sudden trouble speaking, slurred speech, or trouble understanding what people say. ? T - Time. Time to call emergency services. Write down what time symptoms started.  You have other signs of a stroke, such as: ? A sudden, very bad headache with no known cause. ? Feeling sick to your stomach. ? Throwing up. ? Jerky movements you cannot control (seizure). These symptoms may be an emergency. Do not wait to see if the symptoms will go away. Get medical help right away. Call your local emergency services (911 in the [place] Do not drive yourself to the hospital. Summary  After the procedure, it is common to have pain or discomfort in the cuts from  surgery (incisions).  Do not take baths, swim, or use a hot tub until your doctor says it is okay.  Slowly increase your activities. You may need physical therapy or cardiac rehab.  Weigh yourself every day. This helps to see if your body is holding fluid. This information is not intended to replace advice given to you by your health care provider. Make sure you discuss any questions you have with your health care provider. Document Released: 09/17/2013 Document Revised: 05/22/2018 Document Reviewed: 05/22/2018 Elsevier Patient Education  2020 [employer].  Endoscopic Saphenous Vein Harvesting, Care After This sheet gives you information about how to care for yourself after your procedure. Your health care provider may also give you more specific instructions. If you have problems or questions, contact your health care provider. What can I expect after the procedure? After the procedure, it is common to have:  Pain.  Bruising.  Swelling.  Numbness. Follow these instructions at home: Incision care   Follow instructions from your health care provider about how to take care of your incisions. Make sure you: ? Wash your hands with soap and water before and after you change your bandages (dressings). If soap and water are not available, use hand sanitizer. ? Change your dressings as told by your health care provider. ? Leave stitches (sutures), skin glue, or adhesive strips in place. These skin closures may need to stay in place for 2 weeks or longer. If adhesive strip edges start to loosen and curl up, you may trim the loose edges. Do not remove adhesive strips completely unless your health care provider tells you to do that.  Check your incision areas every day for signs of infection. Check for: ? More redness, swelling, or pain. ? Fluid or blood. ? Warmth. ? Pus or a bad smell. Medicines  Take over-the-counter and prescription medicines only as told by your health care  provider.  Ask your health care provider if the medicine prescribed to you requires you to avoid driving or using heavy machinery. General instructions  Raise (elevate) your legs above the level of your heart while you are sitting or lying down.  Avoid crossing your legs.  Avoid sitting for long periods of time. Change positions every 30 minutes.  Do any exercises your health care providers have given you. These may include deep breathing, coughing, and walking exercises.  Do not take baths, swim, or use a hot tub until your health care provider approves. Ask your health care provider if you may take showers. You may only be allowed to take sponge baths.  Wear compression stockings as told by your health care provider. These stockings help to prevent blood clots and reduce swelling in your legs.  Keep all follow-up visits as told by your health care provider. This is important. Contact a health care provider if:  Medicine does not help your pain.  Your pain gets worse.  You have new leg bruises or your leg bruises get bigger.  Your leg feels numb.  You have more redness, swelling, or pain around your incision.  You have fluid or blood coming from your incision.  Your incision feels warm to the touch.  You have pus or a bad smell coming from your incision.  You have a fever. Get help right away if:  Your pain is severe.  You develop pain, tenderness, warmth, redness, or swelling in any part of your leg.  You have chest pain.  You have trouble breathing. Summary  Raise (elevate) your legs above the level of your heart while you are sitting or lying down.  Wear compression stockings as told by your health care provider.  Make sure you know which symptoms should prompt you to contact your health care provider.  Keep all follow-up visits as told by your health care provider. This information is not intended to replace advice given to you by your health care provider.  Make sure you discuss any questions you have with your health care provider. Document Released: 05/25/2011 Document Revised: 08/20/2018 Document Reviewed: 08/20/2018 Elsevier Patient Education  2020 [employer].

## 2019-06-08 NOTE — Progress Notes (Signed)
PRN neb given at this time due to exp wheezing. Patient stated previous treatment did help. RT will continue to monitor.

## 2019-06-08 NOTE — TOC Progression Note (Signed)
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        Expected Discharge Plan and Services                                                 Social Determinants of Health (SDOH) Interventions    Readmission Risk Interventions No flowsheet data found.

## 2019-06-08 NOTE — Progress Notes (Signed)
Called to patients room for PRN breathing treatment. Patient with audible upper airway wheezing and appeared SOB, however, patient states he is not having trouble breathing just feels like he cant cough hard enough. Xopenex PRN breathing treatment given at this time. Patient states he has a little relief afterward treatment. Assessing patient and scheduling nebs. RN at bedside. Will continue to monitor.

## 2019-06-08 NOTE — Plan of Care (Signed)
  Problem: Clinical Measurements: Goal: Respiratory complications will improve Outcome: Progressing   Problem: Clinical Measurements: Goal: Ability to maintain clinical measurements within normal limits will improve Outcome: Progressing   Problem: Health Behavior/Discharge Planning: Goal: Ability to manage health-related needs will improve Outcome: Progressing

## 2019-06-08 NOTE — Progress Notes (Signed)
Progress Note  Patient Name: Andre Gallegos Date of Encounter: 06/08/2019  Primary Cardiologist: No primary care provider on file.   Subjective    Feeling better. Notes some SOB and wheezing. Ambulated around unit yesterday.  Inpatient Medications    Scheduled Meds: . acetaminophen  1,000 mg Oral Q6H   Or  . acetaminophen (TYLENOL) oral liquid 160 mg/5 mL  1,000 mg Per Tube Q6H  . amLODipine  5 mg Oral Daily  . aspirin EC  325 mg Oral Daily   Or  . aspirin  324 mg Per Tube Daily  . atorvastatin  80 mg Oral q1800  . bisacodyl  10 mg Oral Daily   Or  . bisacodyl  10 mg Rectal Daily  . carvedilol  12.5 mg Oral BID WC  . Chlorhexidine Gluconate Cloth  6 each Topical Daily  . clopidogrel  75 mg Oral Daily  . docusate sodium  200 mg Oral Daily  . ezetimibe  10 mg Oral QHS  . ferrous sulfate  325 mg Oral Q breakfast  . folic acid  1 mg Oral Daily  . guaiFENesin  600 mg Oral BID  . levalbuterol  0.63 mg Nebulization Q4H  . pantoprazole  40 mg Oral Daily  . polyethylene glycol  17 g Oral BID  . sodium chloride flush  10-40 mL Intracatheter Q12H  . sodium chloride flush  10-40 mL Intracatheter Q12H  . sodium chloride flush  3 mL Intravenous Q12H   Continuous Infusions: . lactated ringers    . lactated ringers Stopped (06/07/19 [id number])  . milrinone 0.125 mcg/kg/min (06/08/19 0400)   PRN Meds: diphenhydrAMINE, influenza vaccine adjuvanted, levalbuterol, morphine injection, ondansetron (ZOFRAN) IV, oxyCODONE, sodium chloride flush, sodium chloride flush, sodium chloride flush   Vital Signs    Vitals:   06/08/19 0452 06/08/19 0551 06/08/19 0721 06/08/19 0834  BP:   (!) 146/60   Pulse:   89   Resp:   (!) 25   Temp:   98.6 F (37 C)   TempSrc:   Oral   SpO2: 93%  96% 96%  Weight:  111.9 kg    Height:        Intake/Output Summary (Last 24 hours) at 06/08/2019 0846 Last data filed at 06/08/2019 0400 Gross per 24 hour  Intake 69.36 ml  Output 1285 ml  Net -1215.64  ml   Last 3 Weights 06/08/2019 06/07/2019 06/06/2019  Weight (lbs) 246 lb 11.1 oz 249 lb 12.8 oz 217 lb 9.6 oz  Weight (kg) 111.9 kg 113.309 kg 98.703 kg      Telemetry    NSR - Personally Reviewed   Physical Exam  Alert, oriented, in NAD GEN: No acute distress.   Neck: No JVD Cardiac: RRR, no murmurs, rubs, or gallops. Chest tube still in place. Respiratory: Clear to auscultation bilaterally. GI: Soft, nontender, non-distended  MS: No edema; No deformity. Neuro:  Nonfocal  Psych: Normal affect   Labs    High Sensitivity Troponin:   Recent Labs  Lab 06/01/19 1330 06/01/19 1611 06/03/19 1435  TROPONINIHS 8 2,089* 7,033*      Chemistry Recent Labs  Lab 06/03/19 0246 06/04/19 0233  06/06/19 1607 06/07/19 0507 06/08/19 0445  NA 137 137   < > 133* 132* 132*  K 4.4 4.3   < > 4.8 4.8 4.3  CL 108 107   < > 100 100 99  CO2 21* 22   < > 23 24 26   GLUCOSE 114* 113*   < >  152* 157* 147*  BUN 21 23   < > 23 28* 28*  CREATININE 1.56* 1.45*   < > 1.76* 1.81* 1.69*  CALCIUM 9.0 8.9   < > 8.2* 8.4* 8.5*  PROT 5.9* 6.0*  --   --   --   --   ALBUMIN 3.6 3.6  --   --   --   --   AST 63* 38  --   --   --   --   ALT 37 39  --   --   --   --   ALKPHOS 52 53  --   --   --   --   BILITOT 1.1 0.8  --   --   --   --   GFRNONAA 45* 49*   < > 39* 38* 41*  GFRAA 52* 57*   < > 45* 44* 48*  ANIONGAP 8 8   < > 10 8 7    < > = values in this interval not displayed.     Hematology Recent Labs  Lab 06/06/19 1607 06/07/19 0507 06/08/19 0445  WBC 19.3* 18.5* 11.1*  RBC 3.10* 2.95* 2.64*  HGB 10.1* 9.4* 8.3*  HCT 29.2* 28.2* 24.9*  MCV 94.2 95.6 94.3  MCH 32.6 31.9 31.4  MCHC 34.6 33.3 33.3  RDW 13.0 13.1 12.8  PLT 131* 132* 126*    BNP Recent Labs  Lab 06/01/19 2102  BNP 66.6     DDimer No results for input(s): DDIMER in the last 168 hours.   Radiology    Dg Chest Port 1 View  Result Date: 06/08/2019 CLINICAL DATA:  68 year old male status post CABG. EXAM: PORTABLE  CHEST 1 VIEW COMPARISON:  Chest x-ray 06/07/2019. FINDINGS: Previously noted right IJ central venous catheter has been removed. There is a right upper extremity PICC with tip terminating in the superior aspect of the right atrium. Lung volumes are low. Bibasilar opacities likely reflect areas of subsegmental atelectasis. No definite acute consolidative airspace disease. No pleural effusions. Elevation of the right hemidiaphragm (unchanged). No evidence of pulmonary edema. Heart size is normal. Upper mediastinal contours are within normal limits. Aortic atherosclerosis. Status post median sternotomy for CABG. IMPRESSION: 1. Postoperative changes and support apparatus, as above. 2. Low lung volumes with persistent elevation of the right hemidiaphragm and bibasilar areas of subsegmental atelectasis. 3. Aortic atherosclerosis. Electronically Signed   By: Vinnie Langton M.D.   On: 06/08/2019 07:08   Dg Chest Port 1 View  Result Date: 06/07/2019 CLINICAL DATA:  Cough and shortness of breath. Chest tube removed earlier today. EXAM: PORTABLE CHEST 1 VIEW COMPARISON:  Radiograph earlier today at 5:33 a.m. FINDINGS: Left-sided chest tube is been removed. No visualized pneumothorax. Right internal jugular sheath remains in place with tip projecting over the upper SVC. Right upper extremity PICC with tip in the mid SVC. Elevated right hemidiaphragm as before. Post median sternotomy with unchanged cardiomegaly. Minimal atelectasis in the left mid lung. No definite left pleural effusion. No pulmonary edema. IMPRESSION: 1. Removal of left-sided chest tube without visualized pneumothorax. Mild atelectasis in the left mid lung. 2. Unchanged elevation of right hemidiaphragm. 3. Right upper extremity PICC tip in the mid SVC. Electronically Signed   By: Keith Rake M.D.   On: 06/07/2019 21:52   Dg Chest Port 1 View  Result Date: 06/07/2019 CLINICAL DATA:  Chest tube post CABG, chest soreness EXAM: PORTABLE CHEST 1 VIEW  COMPARISON:  Portable exam 0533 hours compared to 06/06/2019  FINDINGS: Interval removal of Swan-Ganz catheter. RIGHT jugular catheter, mediastinal drain, and LEFT thoracostomy tube remain. Postsurgical changes of CABG. Elevation of RIGHT diaphragm with persistent bibasilar atelectasis. No acute infiltrate, gross pleural effusion, or pneumothorax. IMPRESSION: Persistent bibasilar atelectasis. Electronically Signed   By: Lavonia Dana M.D.   On: 06/07/2019 08:22   [place] Ekg Site Rite  Result Date: 06/07/2019 If Site Rite image not attached, placement could not be confirmed due to current cardiac rhythm.   Cardiac Studies   Echo 06/02/2019: IMPRESSIONS    1. The left ventricle has mild-moderately reduced systolic function, with an ejection fraction of 40-45%. Akinesis of basal to apical inferior wall.The cavity size was normal. There is mild concentric left ventricular hypertrophy. Left ventricular  diastolic parameters were normal.  2. The right ventricle has normal systolic function. The cavity was normal. There is no increase in right ventricular wall thickness.  3. Small pericardial effusion, primarily adjacent to RV free wall  Patient Profile     68 y.o. male with inferior STEMI, initial attempts at PCI unsuccessful, transferred to [facility] for CABG.  Patient underwent multivessel CABG 06/05/2019  Assessment & Plan    1.  Inferior STEMI with multivessel CAD: Now status post CABG 06/05/19  Remains on milrinone, but BP stable/elevated. Progressing well. On ASA and Plavix. Co ox is good. Plan to wean milrinone today. Amlodipine resumed.   2.  Hypertension: was on amlodipine, coreg, and lisinopril at home. Now on  coreg at dose of 6.25 mg BID. Hold ACE for AKI. Resume amlodipine.  3.  AKI on CKD stage III: Maintaining adequate urine output. Creatinine improved some today.   4.  Expected postoperative blood loss anemia, Hgb 8.3  5.  Postoperative atrial fibrillation: no recurrence.   6.  Hyperlipidemia: resume zetia 10 mg.  For questions or updates, please contact [place] Please consult [email or web address] for contact info under        Signed, Shaunita Seney [place], MD  06/08/2019, 8:46 AM

## 2019-06-08 NOTE — Progress Notes (Addendum)
      [address]       [place] [address]             [phone]        3 Days Post-Op Procedure(s) (LRB): CORONARY ARTERY BYPASS GRAFTING (CABG) x 3 WITH ENDOSCOPIC HARVESTING OF RIGHT GREATER SAPHENOUS VEIN. LIMA TO LAD. (N/A) TRANSESOPHAGEAL ECHOCARDIOGRAM (TEE) (N/A)  Subjective: Patient eating breakfast. He is passing flatus but no bowel movement yet.  Objective: Vital signs in last 24 hours: Temp:  [98.4 F (36.9 C)-99.4 F (37.4 C)] 98.5 F (36.9 C) (09/12 0317) Pulse Rate:  [80-95] 85 (09/12 0400) Cardiac Rhythm: Normal sinus rhythm (09/12 0400) Resp:  [11-26] 11 (09/12 0400) BP: (127-163)/(66-78) 134/68 (09/12 0400) SpO2:  [90 %-96 %] 93 % (09/12 0452) Weight:  [111.9 kg] 111.9 kg (09/12 0551)  Pre op weight 106.5 kg Current Weight  06/08/19 111.9 kg       Intake/Output from previous day: 09/11 0701 - 09/12 0700 In: 71.1 [I.V.:71.1] Out: 1375 [Urine:1345; Chest Tube:30]   Physical Exam:  Cardiovascular: RRR Pulmonary: Diminished bibasilar breath sounds Abdomen: Soft, non tender, bowel sounds present. Extremities: ++ bilateral lower extremity edema. Wounds: Aquacel removed and sternal wound is clean and dry.  No erythema or signs of infection. RLE wound clean and dry  Lab Results: CBC: Recent Labs    06/07/19 0507 06/08/19 0445  WBC 18.5* 11.1*  HGB 9.4* 8.3*  HCT 28.2* 24.9*  PLT 132* 126*   BMET:  Recent Labs    06/07/19 0507 06/08/19 0445  NA 132* 132*  K 4.8 4.3  CL 100 99  CO2 24 26  GLUCOSE 157* 147*  BUN 28* 28*  CREATININE 1.81* 1.69*  CALCIUM 8.4* 8.5*    PT/INR:  Lab Results  Component Value Date   INR 1.4 (H) 06/05/2019   INR 1.1 06/03/2019   INR 1.0 06/01/2019   ABG:  INR: Will add last result for INR, ABG once components are confirmed Will add last 4 CBG results once components are confirmed  Assessment/Plan:  1. CV - S/p STEMI. SR in the 80's and SBP in the 140's. On Milrinone drip  0.125, Coreg 12.5 mg bid, Plavix 75 mg daily, and baby ec asa. Co ox this am up to 75.7. As discussed with Dr. Cyndia Bent, will stop Milrinone. Will restart low dose Amlodipine. 2.  Pulmonary - On 4 liters via [place]. Wean as able. CXR this am appears to show elevated right hemi daphragm, atelectasis left base. Encourage incentive spirometer. Mucinex for cough 3. Acute on chronic kidney disease-creatinine this am decreased to 1.69. Creatinine on admission 1.87 but baseline appears around 1.4.. He is not on ACE. He is volume overloaded. As discussed with Dr. Cyndia Bent will give Lasix 40 mg IV this am 4.  Acute blood loss anemia - H and H this am decreased to 8.3 and 24.9. Start oral iron and folic acid 5. CBGS 148/149/126. On Insulin. Pre op HGA1C 5.8. Stop accu checks and SS PRN. He likely has pre diabetes and will need follow up with medical doctor after discharge. Will provide nutritional information with discharge paperwork. 6. Mild thrombocytopenia-platelets this am 126,000 7. Remove EPW  Donielle M ZimmermanPA-C 06/08/2019,7:09 AM [id number]   Chart reviewed, patient examined, agree with above. CXR ok but low lung volumes due to obesity. Wt is 12 lbs over preop so will start diuresis since creat is trending down a little.

## 2019-06-08 NOTE — Progress Notes (Signed)
CARDIAC REHAB PHASE I   PRE:  Rate/Rhythm: 89 SR  BP:  Supine:   Sitting: 146/64  Standing:    SaO2: 94% 5L  MODE:  Ambulation: 450 ft   POST:  Rate/Rhythm: 102 ST  BP:  Supine:   Sitting: 160/66  Standing:    SaO2: 92% 4L [id number] Went to get [facility] from [facility] for walk. Pt walked 450 ft on 4L with asst x 2 and gait belt use. Stopped once to rest and checked sats at 92% on 4L. Pt tolerated well. Back to recliner and encouraged more walks and IS. Reinforced sternal precautions.   Graylon Good, RN BSN  06/08/2019 10:34 AM

## 2019-06-09 ENCOUNTER — Inpatient Hospital Stay (HOSPITAL_COMMUNITY): Payer: Medicare Other

## 2019-06-09 LAB — CBC
HCT: 25.3 % — ABNORMAL LOW (ref 39.0–52.0)
Hemoglobin: 8.5 g/dL — ABNORMAL LOW (ref 13.0–17.0)
MCH: 31.6 pg (ref 26.0–34.0)
MCHC: 33.6 g/dL (ref 30.0–36.0)
MCV: 94.1 fL (ref 80.0–100.0)
Platelets: 157 10*3/uL (ref 150–400)
RBC: 2.69 MIL/uL — ABNORMAL LOW (ref 4.22–5.81)
RDW: 13.2 % (ref 11.5–15.5)
WBC: 9.4 10*3/uL (ref 4.0–10.5)
nRBC: 0.2 % (ref 0.0–0.2)

## 2019-06-09 LAB — BASIC METABOLIC PANEL
Anion gap: 9 (ref 5–15)
BUN: 26 mg/dL — ABNORMAL HIGH (ref 8–23)
CO2: 27 mmol/L (ref 22–32)
Calcium: 8.5 mg/dL — ABNORMAL LOW (ref 8.9–10.3)
Chloride: 100 mmol/L (ref 98–111)
Creatinine, Ser: 1.47 mg/dL — ABNORMAL HIGH (ref 0.61–1.24)
GFR calc Af Amer: 56 mL/min — ABNORMAL LOW (ref >60)
GFR calc non Af Amer: 49 mL/min — ABNORMAL LOW (ref >60)
Glucose, Bld: 121 mg/dL — ABNORMAL HIGH (ref 70–99)
Potassium: 4 mmol/L (ref 3.5–5.1)
Sodium: 136 mmol/L (ref 135–145)

## 2019-06-09 LAB — GLUCOSE, CAPILLARY
Glucose-Capillary: 122 mg/dL — ABNORMAL HIGH (ref 70–99)
Glucose-Capillary: 132 mg/dL — ABNORMAL HIGH (ref 70–99)
Glucose-Capillary: 121 mg/dL — ABNORMAL HIGH (ref 70–99)
Glucose-Capillary: 119 mg/dL — ABNORMAL HIGH (ref 70–99)

## 2019-06-09 MED ORDER — LACTULOSE 10 GM/15ML PO SOLN
20.0000 g | Freq: Once | ORAL | Status: AC
  Administered 2019-06-09: 10:00:00 20 g via ORAL
  Filled 2019-06-09: qty 30

## 2019-06-09 MED ORDER — GUAIFENESIN-DM 100-10 MG/5ML PO SYRP
5.0000 mL | ORAL_SOLUTION | ORAL | Status: DC | PRN
  Administered 2019-06-09 (×3): 5 mL via ORAL
  Filled 2019-06-09 (×3): qty 5

## 2019-06-09 MED ORDER — POTASSIUM CHLORIDE CRYS ER 20 MEQ PO TBCR
20.0000 meq | EXTENDED_RELEASE_TABLET | Freq: Every day | ORAL | Status: DC
  Administered 2019-06-09 – 2019-06-11 (×3): 20 meq via ORAL
  Filled 2019-06-09 (×3): qty 1

## 2019-06-09 MED ORDER — LEVALBUTEROL HCL 0.63 MG/3ML IN NEBU
0.6300 mg | INHALATION_SOLUTION | Freq: Four times a day (QID) | RESPIRATORY_TRACT | Status: DC
  Administered 2019-06-10: 08:00:00 0.63 mg via RESPIRATORY_TRACT
  Filled 2019-06-09: qty 3

## 2019-06-09 MED ORDER — LEVALBUTEROL HCL 1.25 MG/0.5ML IN NEBU
INHALATION_SOLUTION | RESPIRATORY_TRACT | Status: AC
  Administered 2019-06-09: 07:00:00
  Filled 2019-06-09: qty 0.5

## 2019-06-09 MED ORDER — FUROSEMIDE 10 MG/ML IJ SOLN
40.0000 mg | Freq: Once | INTRAMUSCULAR | Status: AC
  Administered 2019-06-09: 40 mg via INTRAVENOUS
  Filled 2019-06-09: qty 4

## 2019-06-09 NOTE — Progress Notes (Signed)
Progress Note  Patient Name: Andre Gallegos Date of Encounter: 06/09/2019  Primary Cardiologist: No primary care provider on file.   Subjective   States he had a rough night coughing. No pain. Ambulating well.   Inpatient Medications    Scheduled Meds: . acetaminophen  1,000 mg Oral Q6H   Or  . acetaminophen (TYLENOL) oral liquid 160 mg/5 mL  1,000 mg Per Tube Q6H  . amLODipine  5 mg Oral Daily  . aspirin EC  325 mg Oral Daily   Or  . aspirin  324 mg Per Tube Daily  . atorvastatin  80 mg Oral q1800  . bisacodyl  10 mg Oral Daily   Or  . bisacodyl  10 mg Rectal Daily  . carvedilol  12.5 mg Oral BID WC  . Chlorhexidine Gluconate Cloth  6 each Topical Daily  . clopidogrel  75 mg Oral Daily  . docusate sodium  200 mg Oral Daily  . ezetimibe  10 mg Oral QHS  . ferrous sulfate  325 mg Oral Q breakfast  . folic acid  1 mg Oral Daily  . guaiFENesin  600 mg Oral BID  . levalbuterol  0.63 mg Nebulization Q4H  . pantoprazole  40 mg Oral Daily  . polyethylene glycol  17 g Oral BID  . sodium chloride flush  10-40 mL Intracatheter Q12H  . sodium chloride flush  10-40 mL Intracatheter Q12H  . sodium chloride flush  3 mL Intravenous Q12H   Continuous Infusions: . lactated ringers    . lactated ringers Stopped (06/07/19 0621)   PRN Meds: diphenhydrAMINE, influenza vaccine adjuvanted, levalbuterol, morphine injection, ondansetron (ZOFRAN) IV, oxyCODONE, sodium chloride flush, sodium chloride flush, sodium chloride flush   Vital Signs    Vitals:   06/09/19 0000 06/09/19 0332 06/09/19 0400 06/09/19 0657  BP:  134/63 122/68   Pulse:  84    Resp: 16 16 19    Temp:  99 F (37.2 C)    TempSrc:  Oral    SpO2: 95% 95%  94%  Weight:  107.9 kg    Height:        Intake/Output Summary (Last 24 hours) at 06/09/2019 0713 Last data filed at 06/09/2019 0335 Gross per 24 hour  Intake 531 ml  Output 3325 ml  Net -2794 ml   Last 3 Weights 06/09/2019 06/08/2019 06/07/2019  Weight  (lbs) 237 lb 14 oz 246 lb 11.1 oz 249 lb 12.8 oz  Weight (kg) 107.9 kg 111.9 kg 113.309 kg      Telemetry    NSR - Personally Reviewed   Physical Exam    Alert, oriented, in NAD GEN: No acute distress.   Neck: No JVD Cardiac: RRR, no murmurs, rubs, or gallops. Chest tube still in place. Respiratory: Clear to auscultation bilaterally. GI: Soft, nontender, non-distended  MS: No edema; No deformity. Neuro:  Nonfocal  Psych: Normal affect   Labs    High Sensitivity Troponin:   Recent Labs  Lab 06/01/19 1330 06/01/19 1611 06/03/19 1435  TROPONINIHS 8 2,089* 7,033*      Chemistry Recent Labs  Lab 06/03/19 0246 06/04/19 0233  06/07/19 0507 06/08/19 0445 06/09/19 0407  NA 137 137   < > 132* 132* 136  K 4.4 4.3   < > 4.8 4.3 4.0  CL 108 107   < > 100 99 100  CO2 21* 22   < > 24 26 27   GLUCOSE 114* 113*   < > 157* 147* 121*  BUN 21 23   < > 28* 28* 26*  CREATININE 1.56* 1.45*   < > 1.81* 1.69* 1.47*  CALCIUM 9.0 8.9   < > 8.4* 8.5* 8.5*  PROT 5.9* 6.0*  --   --   --   --   ALBUMIN 3.6 3.6  --   --   --   --   AST 63* 38  --   --   --   --   ALT 37 39  --   --   --   --   ALKPHOS 52 53  --   --   --   --   BILITOT 1.1 0.8  --   --   --   --   GFRNONAA 45* 49*   < > 38* 41* 49*  GFRAA 52* 57*   < > 44* 48* 56*  ANIONGAP 8 8   < > 8 7 9    < > = values in this interval not displayed.     Hematology Recent Labs  Lab 06/07/19 0507 06/08/19 0445 06/09/19 0407  WBC 18.5* 11.1* 9.4  RBC 2.95* 2.64* 2.69*  HGB 9.4* 8.3* 8.5*  HCT 28.2* 24.9* 25.3*  MCV 95.6 94.3 94.1  MCH 31.9 31.4 31.6  MCHC 33.3 33.3 33.6  RDW 13.1 12.8 13.2  PLT 132* 126* 157    BNP No results for input(s): BNP, PROBNP in the last 168 hours.   DDimer No results for input(s): DDIMER in the last 168 hours.   Radiology    Dg Chest Port 1 View  Result Date: 06/08/2019 CLINICAL DATA:  68 year old male status post CABG. EXAM: PORTABLE CHEST 1 VIEW COMPARISON:  Chest x-ray 06/07/2019.  FINDINGS: Previously noted right IJ central venous catheter has been removed. There is a right upper extremity PICC with tip terminating in the superior aspect of the right atrium. Lung volumes are low. Bibasilar opacities likely reflect areas of subsegmental atelectasis. No definite acute consolidative airspace disease. No pleural effusions. Elevation of the right hemidiaphragm (unchanged). No evidence of pulmonary edema. Heart size is normal. Upper mediastinal contours are within normal limits. Aortic atherosclerosis. Status post median sternotomy for CABG. IMPRESSION: 1. Postoperative changes and support apparatus, as above. 2. Low lung volumes with persistent elevation of the right hemidiaphragm and bibasilar areas of subsegmental atelectasis. 3. Aortic atherosclerosis. Electronically Signed   By: Vinnie Langton M.D.   On: 06/08/2019 07:08   Dg Chest Port 1 View  Result Date: 06/07/2019 CLINICAL DATA:  Cough and shortness of breath. Chest tube removed earlier today. EXAM: PORTABLE CHEST 1 VIEW COMPARISON:  Radiograph earlier today at 5:33 a.m. FINDINGS: Left-sided chest tube is been removed. No visualized pneumothorax. Right internal jugular sheath remains in place with tip projecting over the upper SVC. Right upper extremity PICC with tip in the mid SVC. Elevated right hemidiaphragm as before. Post median sternotomy with unchanged cardiomegaly. Minimal atelectasis in the left mid lung. No definite left pleural effusion. No pulmonary edema. IMPRESSION: 1. Removal of left-sided chest tube without visualized pneumothorax. Mild atelectasis in the left mid lung. 2. Unchanged elevation of right hemidiaphragm. 3. Right upper extremity PICC tip in the mid SVC. Electronically Signed   By: Keith Rake M.D.   On: 06/07/2019 21:52   [place] Ekg Site Rite  Result Date: 06/07/2019 If Site Rite image not attached, placement could not be confirmed due to current cardiac rhythm.   Cardiac Studies   Echo 06/02/2019:  IMPRESSIONS  1. The left ventricle has mild-moderately reduced systolic function, with an ejection fraction of 40-45%. Akinesis of basal to apical inferior wall.The cavity size was normal. There is mild concentric left ventricular hypertrophy. Left ventricular  diastolic parameters were normal.  2. The right ventricle has normal systolic function. The cavity was normal. There is no increase in right ventricular wall thickness.  3. Small pericardial effusion, primarily adjacent to RV free wall  Patient Profile     68 y.o. male with inferior STEMI, initial attempts at PCI unsuccessful, transferred to [facility] for CABG.  Patient underwent multivessel CABG 06/05/2019  Assessment & Plan    1.  Inferior STEMI with multivessel CAD: Now status post CABG 06/05/19. Off Milrinone.  Progressing well. On ASA and Plavix. VSS.   2.  Hypertension: was on amlodipine, coreg, and lisinopril at home. Now on  coreg at dose of 6.25 mg BID and amlodipine. BP stable. Hold ACEi for now. Fortunately renal function improving.   3.  AKI on CKD stage III: Maintaining good  urine output. Creatinine improved  today.   4.  Expected postoperative blood loss anemia, Hgb 8.5  5.  Postoperative atrial fibrillation: no recurrence.   6. Hyperlipidemia: resume zetia 10 mg.  For questions or updates, please contact [place] Please consult [email or web address] for contact info under        Signed, Temperence Zenor [place], MD  06/09/2019, 7:13 AM

## 2019-06-09 NOTE — Progress Notes (Signed)
Patient complaint of being tired did not want to be woken for 12 and 4 treatments at this time, Patient states he does need them at these hours at times, but not to wake him unless he were to ask for them, Patient resting at this time, no distress noted.

## 2019-06-09 NOTE — Progress Notes (Addendum)
      [address]       [place] [address]             [phone]        4 Days Post-Op Procedure(s) (LRB): CORONARY ARTERY BYPASS GRAFTING (CABG) x 3 WITH ENDOSCOPIC HARVESTING OF RIGHT GREATER SAPHENOUS VEIN. LIMA TO LAD. (N/A) TRANSESOPHAGEAL ECHOCARDIOGRAM (TEE) (N/A)  Subjective: Patient had a lot of coughing yesterday but not with a lot of sputum production.  Objective: Vital signs in last 24 hours: Temp:  [98.5 F (36.9 C)-99 F (37.2 C)] 99 F (37.2 C) (09/13 0332) Pulse Rate:  [81-89] 84 (09/13 0332) Cardiac Rhythm: Normal sinus rhythm (09/13 0400) Resp:  [16-28] 19 (09/13 0400) BP: (118-146)/(60-68) 122/68 (09/13 0400) SpO2:  [92 %-96 %] 94 % (09/13 0657) Weight:  [107.9 kg] 107.9 kg (09/13 0332)  Pre op weight 106.5 kg Current Weight  06/09/19 107.9 kg       Intake/Output from previous day: 09/12 0701 - 09/13 0700 In: 531 [P.O.:530] Out: 3325 [Urine:3325]   Physical Exam:  Cardiovascular: RRR Pulmonary: Diminished bibasilar breath sounds Abdomen: Soft, non tender, bowel sounds present. Extremities: + bilateral lower extremity edema. Wounds: Sternal wound is clean and dry.  No sternal instability. No erythema or signs of infection. RLE wound clean and dry  Lab Results: CBC: Recent Labs    06/08/19 0445 06/09/19 0407  WBC 11.1* 9.4  HGB 8.3* 8.5*  HCT 24.9* 25.3*  PLT 126* 157   BMET:  Recent Labs    06/08/19 0445 06/09/19 0407  NA 132* 136  K 4.3 4.0  CL 99 100  CO2 26 27  GLUCOSE 147* 121*  BUN 28* 26*  CREATININE 1.69* 1.47*  CALCIUM 8.5* 8.5*    PT/INR:  Lab Results  Component Value Date   INR 1.4 (H) 06/05/2019   INR 1.1 06/03/2019   INR 1.0 06/01/2019   ABG:  INR: Will add last result for INR, ABG once components are confirmed Will add last 4 CBG results once components are confirmed  Assessment/Plan:  1. CV - S/p STEMI. SR in the 80's. On  Coreg 12.5 mg bid, Amlodipine 5 mg daily,Plavix 75 mg  daily, and baby ec asa.    2.  Pulmonary - On 4 liters via [place]. Wean as able. Encourage incentive spirometer. Will try Robitussin 3. Acute on chronic kidney disease-creatinine this am decreased to 1.47. Creatinine on admission 1.87 but baseline appears around 1.4.. He is not on ACE. He is volume overloaded. Continue with Lasix 40 mg IV this am 4.  Acute blood loss anemia - H and H this am stable at 8.5 and 25.3. Continue oral iron and folic acid 5. Mild thrombocytopenia resolved as platelets this am up to 157,000 6. Constipation-patient states Miralax does not work so will stop. Will give lactulose  7. Likely home in 1-2 days if decreased oxygen requirements  Donielle M ZimmermanPA-C 06/09/2019,7:16 AM [id number]   Chart reviewed, patient examined, agree with above. He is making progress. Still has some edema so continuing diuresis. Not sure what his basline wt is.  No BM since surgery. Lactulose ordered.  Wean oxygen  CXR looks stable with low lung volumes.

## 2019-06-10 LAB — BASIC METABOLIC PANEL
Anion gap: 10 (ref 5–15)
BUN: 25 mg/dL — ABNORMAL HIGH (ref 8–23)
CO2: 27 mmol/L (ref 22–32)
Calcium: 8.7 mg/dL — ABNORMAL LOW (ref 8.9–10.3)
Chloride: 100 mmol/L (ref 98–111)
Creatinine, Ser: 1.53 mg/dL — ABNORMAL HIGH (ref 0.61–1.24)
GFR calc Af Amer: 54 mL/min — ABNORMAL LOW (ref >60)
GFR calc non Af Amer: 46 mL/min — ABNORMAL LOW (ref >60)
Glucose, Bld: 117 mg/dL — ABNORMAL HIGH (ref 70–99)
Potassium: 3.9 mmol/L (ref 3.5–5.1)
Sodium: 137 mmol/L (ref 135–145)

## 2019-06-10 LAB — GLUCOSE, CAPILLARY
Glucose-Capillary: 118 mg/dL — ABNORMAL HIGH (ref 70–99)
Glucose-Capillary: 108 mg/dL — ABNORMAL HIGH (ref 70–99)
Glucose-Capillary: 113 mg/dL — ABNORMAL HIGH (ref 70–99)

## 2019-06-10 MED ORDER — LEVALBUTEROL HCL 0.63 MG/3ML IN NEBU
0.6300 mg | INHALATION_SOLUTION | Freq: Two times a day (BID) | RESPIRATORY_TRACT | Status: DC
  Filled 2019-06-10 (×2): qty 3

## 2019-06-10 MED ORDER — ASPIRIN EC 81 MG PO TBEC
81.0000 mg | DELAYED_RELEASE_TABLET | Freq: Every day | ORAL | Status: DC
  Administered 2019-06-10 – 2019-06-11 (×2): 81 mg via ORAL
  Filled 2019-06-10 (×2): qty 1

## 2019-06-10 NOTE — Progress Notes (Addendum)
The patient has been seen in conjunction with Reino Bellis, NP. All aspects of care have been considered and discussed. The patient has been personally interviewed, examined, and all clinical data has been reviewed.   Improving.  Ambulating.  Nearing discharge.  Progress Note  Patient Name: Andre Gallegos Date of Encounter: 06/10/2019  Primary Cardiologist: Hartwell, MD   Subjective   Feeling well, just walked in the hallway.   Inpatient Medications    Scheduled Meds: . acetaminophen  1,000 mg Oral Q6H   Or  . acetaminophen (TYLENOL) oral liquid 160 mg/5 mL  1,000 mg Per Tube Q6H  . amLODipine  5 mg Oral Daily  . aspirin EC  81 mg Oral Daily  . atorvastatin  80 mg Oral q1800  . bisacodyl  10 mg Oral Daily   Or  . bisacodyl  10 mg Rectal Daily  . carvedilol  12.5 mg Oral BID WC  . Chlorhexidine Gluconate Cloth  6 each Topical Daily  . clopidogrel  75 mg Oral Daily  . docusate sodium  200 mg Oral Daily  . ezetimibe  10 mg Oral QHS  . ferrous sulfate  325 mg Oral Q breakfast  . folic acid  1 mg Oral Daily  . levalbuterol  0.63 mg Nebulization QID  . pantoprazole  40 mg Oral Daily  . potassium chloride  20 mEq Oral Daily  . sodium chloride flush  10-40 mL Intracatheter Q12H  . sodium chloride flush  10-40 mL Intracatheter Q12H  . sodium chloride flush  3 mL Intravenous Q12H   Continuous Infusions: . lactated ringers    . lactated ringers Stopped (06/07/19 0621)   PRN Meds: diphenhydrAMINE, guaiFENesin-dextromethorphan, influenza vaccine adjuvanted, levalbuterol, morphine injection, ondansetron (ZOFRAN) IV, oxyCODONE, sodium chloride flush, sodium chloride flush, sodium chloride flush   Vital Signs    Vitals:   06/10/19 0000 06/10/19 0440 06/10/19 0723 06/10/19 0806  BP:  135/67    Pulse: 81 83    Resp: 17 20    Temp:  98.7 F (37.1 C) 99.4 F (37.4 C)   TempSrc:  Oral Oral   SpO2: 99% 97%  96%  Weight:  105.3 kg    Height:         Intake/Output Summary (Last 24 hours) at 06/10/2019 0842 Last data filed at 06/10/2019 0600 Gross per 24 hour  Intake 1020 ml  Output 2850 ml  Net -1830 ml   Last 3 Weights 06/10/2019 06/09/2019 06/08/2019  Weight (lbs) 232 lb 2.3 oz 237 lb 14 oz 246 lb 11.1 oz  Weight (kg) 105.3 kg 107.9 kg 111.9 kg      Telemetry    SR - Personally Reviewed  ECG    No new tracing- Personally Reviewed  Physical Exam  Pleasant older WM GEN: No acute distress.   Neck: No JVD Cardiac: RRR, no murmurs, rubs, or gallops.  Respiratory: Clear to auscultation bilaterally. GI: Soft, nontender, non-distended  MS: No edema; No deformity. Neuro:  Nonfocal  Psych: Normal affect   Labs    High Sensitivity Troponin:   Recent Labs  Lab 06/01/19 1330 06/01/19 1611 06/03/19 1435  TROPONINIHS 8 2,089* 7,033*      Chemistry Recent Labs  Lab 06/04/19 0233  06/08/19 0445 06/09/19 0407 06/10/19 0504  NA 137   < > 132* 136 137  K 4.3   < > 4.3 4.0 3.9  CL 107   < > 99 100 100  CO2 22   < >  26 27 27   GLUCOSE 113*   < > 147* 121* 117*  BUN 23   < > 28* 26* 25*  CREATININE 1.45*   < > 1.69* 1.47* 1.53*  CALCIUM 8.9   < > 8.5* 8.5* 8.7*  PROT 6.0*  --   --   --   --   ALBUMIN 3.6  --   --   --   --   AST 38  --   --   --   --   ALT 39  --   --   --   --   ALKPHOS 53  --   --   --   --   BILITOT 0.8  --   --   --   --   GFRNONAA 49*   < > 41* 49* 46*  GFRAA 57*   < > 48* 56* 54*  ANIONGAP 8   < > 7 9 10    < > = values in this interval not displayed.     Hematology Recent Labs  Lab 06/07/19 0507 06/08/19 0445 06/09/19 0407  WBC 18.5* 11.1* 9.4  RBC 2.95* 2.64* 2.69*  HGB 9.4* 8.3* 8.5*  HCT 28.2* 24.9* 25.3*  MCV 95.6 94.3 94.1  MCH 31.9 31.4 31.6  MCHC 33.3 33.3 33.6  RDW 13.1 12.8 13.2  PLT 132* 126* 157    BNPNo results for input(s): BNP, PROBNP in the last 168 hours.   DDimer No results for input(s): DDIMER in the last 168 hours.   Radiology    Dg Chest 2 View  Result  Date: 06/09/2019 CLINICAL DATA:  68 year old male with history of pneumothorax. Pleural effusion. EXAM: CHEST - 2 VIEW COMPARISON:  Chest x-ray 06/08/2019. FINDINGS: There is a right upper extremity PICC with tip terminating in the superior cavoatrial junction. Elevation of the right hemidiaphragm, unchanged. Low lung volumes. No acute consolidative airspace disease. No pleural effusions. No pneumothorax. No suspicious appearing pulmonary nodules or masses are noted. No evidence of pulmonary edema. Heart size is normal. Upper mediastinal contours are within normal limits. Status post median sternotomy for CABG. IMPRESSION: 1. Postoperative changes and support apparatus, as above. 2. Low lung volumes with chronic elevation of the right hemidiaphragm unchanged. Electronically Signed   By: Vinnie Langton M.D.   On: 06/09/2019 08:20    Cardiac Studies   Cath: 06/01/19   Prox RCA lesion is 50% stenosed.  Mid RCA lesion is 99% stenosed.  RPDA lesion is 99% stenosed.  RPAV lesion is 95% stenosed.  Prox LAD lesion is 60% stenosed.  1st Diag lesion is 90% stenosed.  Ost Cx to Prox Cx lesion is 50% stenosed.   Conclusion Mildly depressed left ventricular function with inferior hypokinesis ejection fraction around 45% Left coronary system with moderate to severe coronary disease TIMI-3 flow RCA with severe disease in the mid and distal RCA tortuous heavily calcified vessel TIMI-3 flow Intervention Unsuccessful PCI and stent of mid RCA and distal lesion unable to traverse with the wire port guide support tortuous calcified vessel Successful placement of intra-aortic balloon pump to help with stabilization and symptoms Patient transferred to [facility] to Dr. Daneen Schick for evaluation of possible complex high risk intervention of RCA versus coronary bypass surgery   Echo 06/02/2019: IMPRESSIONS   1. The left ventricle has mild-moderately reduced systolic function, with an ejection fraction  of 40-45%. Akinesis of basal to apical inferior wall.The cavity size was normal. There is mild concentric left ventricular hypertrophy. Left  ventricular  diastolic parameters were normal. 2. The right ventricle has normal systolic function. The cavity was normal. There is no increase in right ventricular wall thickness. 3. Small pericardial effusion, primarily adjacent to RV free wall  Patient Profile     68 y.o. male  with inferior STEMI, initial attempts at PCI unsuccessful, transferred to [facility] for CABG. Patient underwent multivessel CABG 06/05/2019.  Assessment & Plan    1. Inferior STEMI with multivessel CAD: Now status post CABG 06/05/19. Off Milrinone. Progressing well. ASA 81mg  (reduced today) and Plavix.  2. Hypertension: was on amlodipine, coreg, and lisinopril at home. Now on coreg at dose of 12.5mg  BID and amlodipine. BP stable. Hold ACEi for now with elevated Cr.  3. AKI on CKD stage III: Maintaining good  urine output. Creatinine stable.  4. Expected postoperative blood loss anemia: Hgb 8.5  5. Postoperative atrial fibrillation: no recurrence.   6. Hyperlipidemia: On high dose statin and zetia 10 mg.  CARDIOLOGY RECOMMENDATIONS:  Discharge is anticipated in the next 48 hours. Recommendations for medications and follow up:  Discharge Medications: Continue medications as they are currently listed in the [facility]. Exceptions to the above: Non  Follow Up: The patient's Primary Cardiologist is Belva Crome III, MD  Follow up in the office in 2 week(s).  Signed,  Reino Bellis, NP  8:42 AM 06/10/2019  [facility]

## 2019-06-10 NOTE — Progress Notes (Addendum)
[address]       [place] [address]             [phone]      5 Days Post-Op Procedure(s) (LRB): CORONARY ARTERY BYPASS GRAFTING (CABG) x 3 WITH ENDOSCOPIC HARVESTING OF RIGHT GREATER SAPHENOUS VEIN. LIMA TO LAD. (N/A) TRANSESOPHAGEAL ECHOCARDIOGRAM (TEE) (N/A) Subjective: Feels pretty well, mild cough  Objective: Vital signs in last 24 hours: Temp:  [98.4 F (36.9 C)-98.7 F (37.1 C)] 98.7 F (37.1 C) (09/14 0440) Pulse Rate:  [81-83] 83 (09/14 0440) Cardiac Rhythm: Normal sinus rhythm (09/14 0700) Resp:  [11-20] 20 (09/14 0440) BP: (122-141)/(64-87) 135/67 (09/14 0440) SpO2:  [94 %-99 %] 97 % (09/14 0440) FiO2 (%):  [36 %] 36 % (09/13 1534) Weight:  [105.3 kg] 105.3 kg (09/14 0440)  Hemodynamic parameters for last 24 hours:    Intake/Output from previous day: 09/13 0701 - 09/14 0700 In: 1260 [P.O.:1260] Out: 3250 [Urine:3250] Intake/Output this shift: No intake/output data recorded.  General appearance: alert, cooperative and no distress Heart: regular rate and rhythm Lungs: clear to auscultation bilaterally Abdomen: benign Extremities: min edema Wound: incis healing well  Lab Results: Recent Labs    06/08/19 0445 06/09/19 0407  WBC 11.1* 9.4  HGB 8.3* 8.5*  HCT 24.9* 25.3*  PLT 126* 157   BMET:  Recent Labs    06/09/19 0407 06/10/19 0504  NA 136 137  K 4.0 3.9  CL 100 100  CO2 27 27  GLUCOSE 121* 117*  BUN 26* 25*  CREATININE 1.47* 1.53*  CALCIUM 8.5* 8.7*    PT/INR: No results for input(s): LABPROT, INR in the last 72 hours. ABG    Component Value Date/Time   PHART 7.381 06/05/2019 2010   HCO3 22.1 06/05/2019 2010   TCO2 23 06/05/2019 2010   ACIDBASEDEF 2.0 06/05/2019 2010   O2SAT 75.7 06/08/2019 0445   CBG (last 3)  Recent Labs    06/09/19 1613 06/09/19 2209 06/10/19 0635  GLUCAP 121* 119* 118*    Meds Scheduled Meds: . acetaminophen  1,000 mg Oral Q6H   Or  . acetaminophen (TYLENOL) oral  liquid 160 mg/5 mL  1,000 mg Per Tube Q6H  . amLODipine  5 mg Oral Daily  . aspirin EC  325 mg Oral Daily   Or  . aspirin  324 mg Per Tube Daily  . atorvastatin  80 mg Oral q1800  . bisacodyl  10 mg Oral Daily   Or  . bisacodyl  10 mg Rectal Daily  . carvedilol  12.5 mg Oral BID WC  . Chlorhexidine Gluconate Cloth  6 each Topical Daily  . clopidogrel  75 mg Oral Daily  . docusate sodium  200 mg Oral Daily  . ezetimibe  10 mg Oral QHS  . ferrous sulfate  325 mg Oral Q breakfast  . folic acid  1 mg Oral Daily  . levalbuterol  0.63 mg Nebulization QID  . pantoprazole  40 mg Oral Daily  . potassium chloride  20 mEq Oral Daily  . sodium chloride flush  10-40 mL Intracatheter Q12H  . sodium chloride flush  10-40 mL Intracatheter Q12H  . sodium chloride flush  3 mL Intravenous Q12H   Continuous Infusions: . lactated ringers    . lactated ringers Stopped (06/07/19 0621)   PRN Meds:.diphenhydrAMINE, guaiFENesin-dextromethorphan, influenza vaccine adjuvanted, levalbuterol, morphine injection, ondansetron (ZOFRAN) IV, oxyCODONE, sodium chloride flush, sodium chloride flush, sodium chloride flush  Xrays Dg Chest 2  View  Result Date: 06/09/2019 CLINICAL DATA:  68 year old male with history of pneumothorax. Pleural effusion. EXAM: CHEST - 2 VIEW COMPARISON:  Chest x-ray 06/08/2019. FINDINGS: There is a right upper extremity PICC with tip terminating in the superior cavoatrial junction. Elevation of the right hemidiaphragm, unchanged. Low lung volumes. No acute consolidative airspace disease. No pleural effusions. No pneumothorax. No suspicious appearing pulmonary nodules or masses are noted. No evidence of pulmonary edema. Heart size is normal. Upper mediastinal contours are within normal limits. Status post median sternotomy for CABG. IMPRESSION: 1. Postoperative changes and support apparatus, as above. 2. Low lung volumes with chronic elevation of the right hemidiaphragm unchanged. Electronically  Signed   By: Vinnie Langton M.D.   On: 06/09/2019 08:20    Assessment/Plan: S/P Procedure(s) (LRB): CORONARY ARTERY BYPASS GRAFTING (CABG) x 3 WITH ENDOSCOPIC HARVESTING OF RIGHT GREATER SAPHENOUS VEIN. LIMA TO LAD. (N/A) TRANSESOPHAGEAL ECHOCARDIOGRAM (TEE) (N/A)  1 conts with good progress overall 2 cont pulm toilet/RX/O2 wean- pulm exam conts to improve with clear lungs currently 3 hemodyn stable in sinus rhythm- HTn control conts to improve, cont current meds 4 CKD - , creat is pretty stable, improved from peak, excellent UOP with mild volume overload 5 BS well controlled 6 routine rehab 7 poss home in am , may need to consider home O2 at that time  LOS: 9 days    John Giovanni PA-C 06/10/2019 Pager [phone]  Check O2 sats after ambulatiobn homr in am  patient examined and medical record reviewed,agree with above note. Tharon Aquas Trigt III 06/10/2019

## 2019-06-10 NOTE — Progress Notes (Signed)
SATURATION QUALIFICATIONS: (This note is used to comply with regulatory documentation for home oxygen)  Patient Saturations on Room Air at Rest = 92%  Patient Saturations on Room Air while Ambulating = 79%  Patient Saturations on 2 Liters of oxygen while Ambulating = 94%  Please briefly explain why patient needs home oxygen: Pt needed home O2 since he desats  On ambulation.

## 2019-06-10 NOTE — Progress Notes (Signed)
With order to remove epicardial pacing wire. No wire noted. Pt claimed that the wire had been removed last Friday.

## 2019-06-10 NOTE — Progress Notes (Addendum)
CARDIAC REHAB PHASE I   PRE:  Rate/Rhythm: 85 SR    BP: sitting 135/69    SaO2: 96 3 1/2L  MODE:  Ambulation: 820 ft   POST:  Rate/Rhythm: 97 SR    BP: sitting 154/73     SaO2: 95 RA  Pt able to stand independently and walk with RW, standby assist. He is much stronger now, steady, increased distance. Began on 2L O2 but he was able to wean down to RA by end of the walk. Left on RA. He is doing well with IS now. Encouraged use and x2-3 more walks today on his own or with staff. He has a RW at home if he needs it at d/c.  [place], ACSM 06/10/2019 8:49 AM

## 2019-06-10 NOTE — Progress Notes (Signed)
Midsternum  and right upper leg surgical site cleansed and painted with betadine.

## 2019-06-11 LAB — BASIC METABOLIC PANEL
Anion gap: 12 (ref 5–15)
BUN: 24 mg/dL — ABNORMAL HIGH (ref 8–23)
CO2: 24 mmol/L (ref 22–32)
Calcium: 8.9 mg/dL (ref 8.9–10.3)
Chloride: 101 mmol/L (ref 98–111)
Creatinine, Ser: 1.44 mg/dL — ABNORMAL HIGH (ref 0.61–1.24)
GFR calc Af Amer: 58 mL/min — ABNORMAL LOW (ref >60)
GFR calc non Af Amer: 50 mL/min — ABNORMAL LOW (ref >60)
Glucose, Bld: 120 mg/dL — ABNORMAL HIGH (ref 70–99)
Potassium: 4.4 mmol/L (ref 3.5–5.1)
Sodium: 137 mmol/L (ref 135–145)

## 2019-06-11 LAB — GLUCOSE, CAPILLARY: Glucose-Capillary: 112 mg/dL — ABNORMAL HIGH (ref 70–99)

## 2019-06-11 MED ORDER — FOLIC ACID 1 MG PO TABS: 1.0000 mg | ORAL_TABLET | Freq: Every day | ORAL | 3 refills | Status: DC

## 2019-06-11 MED ORDER — FERROUS SULFATE 325 (65 FE) MG PO TABS: 325.0000 mg | ORAL_TABLET | Freq: Every day | ORAL | 3 refills | Status: DC

## 2019-06-11 MED ORDER — CARVEDILOL 12.5 MG PO TABS: 12.5000 mg | ORAL_TABLET | Freq: Two times a day (BID) | ORAL | 1 refills | Status: DC

## 2019-06-11 MED ORDER — ATORVASTATIN CALCIUM 80 MG PO TABS: 80.0000 mg | ORAL_TABLET | Freq: Every day | ORAL | 1 refills | Status: DC

## 2019-06-11 MED ORDER — OXYCODONE HCL 5 MG PO TABS: 5.0000 mg | ORAL_TABLET | Freq: Four times a day (QID) | ORAL | 0 refills | Status: AC | PRN

## 2019-06-11 MED ORDER — CLOPIDOGREL BISULFATE 75 MG PO TABS: 75.0000 mg | ORAL_TABLET | Freq: Every day | ORAL | 1 refills | Status: AC

## 2019-06-11 MED FILL — Lidocaine HCl Local Soln Prefilled Syringe 100 MG/5ML (2%): INTRAMUSCULAR | Qty: 5 | Status: AC

## 2019-06-11 MED FILL — Electrolyte-R (PH 7.4) Solution: INTRAVENOUS | Qty: 4000 | Status: AC

## 2019-06-11 MED FILL — Magnesium Sulfate Inj 50%: INTRAMUSCULAR | Qty: 10 | Status: AC

## 2019-06-11 MED FILL — Sodium Chloride IV Soln 0.9%: INTRAVENOUS | Qty: 2000 | Status: AC

## 2019-06-11 MED FILL — Heparin Sodium (Porcine) Inj 1000 Unit/ML: INTRAMUSCULAR | Qty: 10 | Status: AC

## 2019-06-11 MED FILL — Sodium Bicarbonate IV Soln 8.4%: INTRAVENOUS | Qty: 100 | Status: AC

## 2019-06-11 MED FILL — Potassium Chloride Inj 2 mEq/ML: INTRAVENOUS | Qty: 40 | Status: AC

## 2019-06-11 MED FILL — Heparin Sodium (Porcine) Inj 1000 Unit/ML: INTRAMUSCULAR | Qty: 30 | Status: AC

## 2019-06-11 NOTE — Progress Notes (Addendum)
[address]       [place] [address]             [phone]      6 Days Post-Op Procedure(s) (LRB): CORONARY ARTERY BYPASS GRAFTING (CABG) x 3 WITH ENDOSCOPIC HARVESTING OF RIGHT GREATER SAPHENOUS VEIN. LIMA TO LAD. (N/A) TRANSESOPHAGEAL ECHOCARDIOGRAM (TEE) (N/A) Subjective: Looks and feels well Off O2  Objective: Vital signs in last 24 hours: Temp:  [98.3 F (36.8 C)-98.9 F (37.2 C)] 98.8 F (37.1 C) (09/15 0505) Pulse Rate:  [82-89] 88 (09/15 0505) Cardiac Rhythm: Normal sinus rhythm (09/14 2300) Resp:  [16-23] 17 (09/15 0545) BP: (124-163)/(64-76) 133/74 (09/15 0505) SpO2:  [95 %-99 %] 99 % (09/15 0505) Weight:  [104.6 kg] 104.6 kg (09/15 0505)  Hemodynamic parameters for last 24 hours:    Intake/Output from previous day: 09/14 0701 - 09/15 0700 In: 195.1 [P.O.:120; I.V.:75.1] Out: 600 [Urine:600] Intake/Output this shift: No intake/output data recorded.  General appearance: alert, cooperative and no distress Heart: regular rate and rhythm Lungs: clear to auscultation bilaterally Abdomen: benign Extremities: trace edema Wound: incis healing well  Lab Results: Recent Labs    06/09/19 0407  WBC 9.4  HGB 8.5*  HCT 25.3*  PLT 157   BMET:  Recent Labs    06/10/19 0504 06/11/19 0500  NA 137 137  K 3.9 4.4  CL 100 101  CO2 27 24  GLUCOSE 117* 120*  BUN 25* 24*  CREATININE 1.53* 1.44*  CALCIUM 8.7* 8.9    PT/INR: No results for input(s): LABPROT, INR in the last 72 hours. ABG    Component Value Date/Time   PHART 7.381 06/05/2019 2010   HCO3 22.1 06/05/2019 2010   TCO2 23 06/05/2019 2010   ACIDBASEDEF 2.0 06/05/2019 2010   O2SAT 75.7 06/08/2019 0445   CBG (last 3)  Recent Labs    06/10/19 0635 06/10/19 1032 06/10/19 1609  GLUCAP 118* 108* 113*    Meds Scheduled Meds: . amLODipine  5 mg Oral Daily  . aspirin EC  81 mg Oral Daily  . atorvastatin  80 mg Oral q1800  . bisacodyl  10 mg Oral Daily   Or  .  bisacodyl  10 mg Rectal Daily  . carvedilol  12.5 mg Oral BID WC  . Chlorhexidine Gluconate Cloth  6 each Topical Daily  . clopidogrel  75 mg Oral Daily  . docusate sodium  200 mg Oral Daily  . ezetimibe  10 mg Oral QHS  . ferrous sulfate  325 mg Oral Q breakfast  . folic acid  1 mg Oral Daily  . levalbuterol  0.63 mg Nebulization BID  . pantoprazole  40 mg Oral Daily  . potassium chloride  20 mEq Oral Daily  . sodium chloride flush  10-40 mL Intracatheter Q12H  . sodium chloride flush  10-40 mL Intracatheter Q12H  . sodium chloride flush  3 mL Intravenous Q12H   Continuous Infusions: . lactated ringers    . lactated ringers Stopped (06/07/19 0621)   PRN Meds:.diphenhydrAMINE, guaiFENesin-dextromethorphan, influenza vaccine adjuvanted, levalbuterol, morphine injection, ondansetron (ZOFRAN) IV, oxyCODONE, sodium chloride flush, sodium chloride flush, sodium chloride flush  Xrays No results found.  Assessment/Plan: S/P Procedure(s) (LRB): CORONARY ARTERY BYPASS GRAFTING (CABG) x 3 WITH ENDOSCOPIC HARVESTING OF RIGHT GREATER SAPHENOUS VEIN. LIMA TO LAD. (N/A) TRANSESOPHAGEAL ECHOCARDIOGRAM (TEE) (N/A)  1 doing well 2 hemodyn stable in sinus rhythm, some HTN- increase norvasc to 10 3 creat improved- no ARB/ACE-I yet 4  off O2  5 stable for d/c   LOS: 10 days    John Giovanni [facility] 06/11/2019 Pager [phone]  DC instructions reviewed with patient Ready for DC after CABG x3, preop MI  patient examined and medical record reviewed,agree with above note. Tharon Aquas Trigt III 06/11/2019

## 2019-06-11 NOTE — Progress Notes (Signed)
CT sutures removed per protocol, steri-strips applied.

## 2019-06-11 NOTE — Progress Notes (Signed)
Pt has been walking independently since yesterday am. No O2 needed. Discussed IS, sternal precautions, exercise at home, diet, and CRPII. Good reception and requests his referral be sent to [place]. He has a RW at home. [place] CES, ACSM 8:19 AM 06/11/2019

## 2019-06-11 NOTE — Plan of Care (Signed)
  Problem: Education: Goal: Knowledge of General Education information will improve Description: Including pain rating scale, medication(s)/side effects and non-pharmacologic comfort measures Outcome: Adequate for Discharge   Problem: Health Behavior/Discharge Planning: Goal: Ability to manage health-related needs will improve Outcome: Adequate for Discharge   Problem: Clinical Measurements: Goal: Ability to maintain clinical measurements within normal limits will improve Outcome: Adequate for Discharge Goal: Will remain free from infection Outcome: Adequate for Discharge Goal: Diagnostic test results will improve Outcome: Adequate for Discharge Goal: Respiratory complications will improve Outcome: Adequate for Discharge Goal: Cardiovascular complication will be avoided Outcome: Adequate for Discharge   Problem: Activity: Goal: Risk for activity intolerance will decrease Outcome: Adequate for Discharge   Problem: Nutrition: Goal: Adequate nutrition will be maintained Outcome: Adequate for Discharge   Problem: Coping: Goal: Level of anxiety will decrease Outcome: Adequate for Discharge   Problem: Elimination: Goal: Will not experience complications related to bowel motility Outcome: Adequate for Discharge Goal: Will not experience complications related to urinary retention Outcome: Adequate for Discharge   Problem: Pain Managment: Goal: General experience of comfort will improve Outcome: Adequate for Discharge   Problem: Safety: Goal: Ability to remain free from injury will improve Outcome: Adequate for Discharge   Problem: Skin Integrity: Goal: Risk for impaired skin integrity will decrease Outcome: Adequate for Discharge   Problem: Education: Goal: Understanding of CV disease, CV risk reduction, and recovery process will improve Outcome: Adequate for Discharge Goal: Individualized Educational Video(s) Outcome: Adequate for Discharge   Problem:  Activity: Goal: Ability to return to baseline activity level will improve Outcome: Adequate for Discharge   Problem: Cardiovascular: Goal: Ability to achieve and maintain adequate cardiovascular perfusion will improve Outcome: Adequate for Discharge Goal: Vascular access site(s) Level 0-1 will be maintained Outcome: Adequate for Discharge   Problem: Health Behavior/Discharge Planning: Goal: Ability to safely manage health-related needs after discharge will improve Outcome: Adequate for Discharge   Problem: Education: Goal: Understanding of cardiac disease, CV risk reduction, and recovery process will improve Outcome: Adequate for Discharge Goal: Understanding of medication regimen will improve Outcome: Adequate for Discharge Goal: Individualized Educational Video(s) Outcome: Adequate for Discharge   Problem: Activity: Goal: Ability to tolerate increased activity will improve Outcome: Adequate for Discharge   Problem: Cardiac: Goal: Ability to achieve and maintain adequate cardiopulmonary perfusion will improve Outcome: Adequate for Discharge Goal: Vascular access site(s) Level 0-1 will be maintained Outcome: Adequate for Discharge   Problem: Health Behavior/Discharge Planning: Goal: Ability to safely manage health-related needs after discharge will improve Outcome: Adequate for Discharge   

## 2019-06-12 ENCOUNTER — Telehealth: Payer: Self-pay

## 2019-06-12 ENCOUNTER — Other Ambulatory Visit: Payer: Self-pay | Admitting: *Deleted

## 2019-06-12 NOTE — Telephone Encounter (Signed)
Transition Care Management Follow-up Telephone Call  Date of discharge and from where: 06/11/2019, [place]  How have you been since you were released from the hospital?   Any questions or concerns? No   Items Reviewed:  Did the pt receive and understand the discharge instructions provided? Yes   Medications obtained and verified? Yes   Any new allergies since your discharge? No   Dietary orders reviewed? Yes  Do you have support at home? Yes   Functional Questionnaire: (I = Independent and D = Dependent) ADLs: I   Bathing/Dressing- i  Meal Prep- i  Eating- i  Maintaining continence- i  Transferring/Ambulation- d, cane  Managing Meds- i  Follow up appointments reviewed:   PCP Hospital f/u appt confirmed? Yes  Scheduled to see Dr.Crissman on 9 @ [facility] f/u appt confirmed? Yes  Scheduled to see Dr.Callwood on 06/20/2019 @ 10am.  Are transportation arrangements needed? No   If their condition worsens, is the pt aware to call PCP or go to the Emergency Dept.? Yes  Was the patient provided with contact information for the PCP's office or ED? Yes  Was to pt encouraged to call back with questions or concerns? Yes

## 2019-06-17 ENCOUNTER — Emergency Department: Payer: Medicare Other

## 2019-06-17 ENCOUNTER — Other Ambulatory Visit: Payer: Self-pay

## 2019-06-17 ENCOUNTER — Encounter: Payer: Self-pay | Admitting: Family Medicine

## 2019-06-17 ENCOUNTER — Encounter: Payer: Self-pay | Admitting: *Deleted

## 2019-06-17 ENCOUNTER — Inpatient Hospital Stay
Admission: EM | Admit: 2019-06-17 | Discharge: 2019-06-19 | DRG: 065 | Disposition: A | Discharge status: Home / Self Care | Payer: Medicare Other | Attending: Internal Medicine | Admitting: Internal Medicine

## 2019-06-17 ENCOUNTER — Ambulatory Visit (INDEPENDENT_AMBULATORY_CARE_PROVIDER_SITE_OTHER): Payer: Medicare Other | Admitting: Family Medicine

## 2019-06-17 DIAGNOSIS — Z951 Presence of aortocoronary bypass graft: Secondary | ICD-10-CM

## 2019-06-17 DIAGNOSIS — I2510 Atherosclerotic heart disease of native coronary artery without angina pectoris: Secondary | ICD-10-CM | POA: Diagnosis present

## 2019-06-17 DIAGNOSIS — I313 Pericardial effusion (noninflammatory): Secondary | ICD-10-CM | POA: Diagnosis present

## 2019-06-17 DIAGNOSIS — R29700 NIHSS score 0: Secondary | ICD-10-CM | POA: Diagnosis present

## 2019-06-17 DIAGNOSIS — G459 Transient cerebral ischemic attack, unspecified: Secondary | ICD-10-CM | POA: Diagnosis not present

## 2019-06-17 DIAGNOSIS — E782 Mixed hyperlipidemia: Secondary | ICD-10-CM | POA: Diagnosis present

## 2019-06-17 DIAGNOSIS — I10 Essential (primary) hypertension: Secondary | ICD-10-CM

## 2019-06-17 DIAGNOSIS — I639 Cerebral infarction, unspecified: Principal | ICD-10-CM | POA: Diagnosis present

## 2019-06-17 DIAGNOSIS — I712 Thoracic aortic aneurysm, without rupture: Secondary | ICD-10-CM | POA: Diagnosis present

## 2019-06-17 DIAGNOSIS — Z87891 Personal history of nicotine dependence: Secondary | ICD-10-CM

## 2019-06-17 DIAGNOSIS — I214 Non-ST elevation (NSTEMI) myocardial infarction: Secondary | ICD-10-CM

## 2019-06-17 DIAGNOSIS — I129 Hypertensive chronic kidney disease with stage 1 through stage 4 chronic kidney disease, or unspecified chronic kidney disease: Secondary | ICD-10-CM | POA: Diagnosis present

## 2019-06-17 DIAGNOSIS — Z9861 Coronary angioplasty status: Secondary | ICD-10-CM

## 2019-06-17 DIAGNOSIS — Z7902 Long term (current) use of antithrombotics/antiplatelets: Secondary | ICD-10-CM

## 2019-06-17 DIAGNOSIS — Z801 Family history of malignant neoplasm of trachea, bronchus and lung: Secondary | ICD-10-CM

## 2019-06-17 DIAGNOSIS — D638 Anemia in other chronic diseases classified elsewhere: Secondary | ICD-10-CM | POA: Diagnosis present

## 2019-06-17 DIAGNOSIS — Z20828 Contact with and (suspected) exposure to other viral communicable diseases: Secondary | ICD-10-CM | POA: Diagnosis present

## 2019-06-17 DIAGNOSIS — I252 Old myocardial infarction: Secondary | ICD-10-CM

## 2019-06-17 DIAGNOSIS — R4701 Aphasia: Secondary | ICD-10-CM | POA: Diagnosis present

## 2019-06-17 DIAGNOSIS — Z7982 Long term (current) use of aspirin: Secondary | ICD-10-CM

## 2019-06-17 DIAGNOSIS — R531 Weakness: Secondary | ICD-10-CM | POA: Diagnosis present

## 2019-06-17 DIAGNOSIS — R29810 Facial weakness: Secondary | ICD-10-CM | POA: Diagnosis present

## 2019-06-17 DIAGNOSIS — Z8673 Personal history of transient ischemic attack (TIA), and cerebral infarction without residual deficits: Secondary | ICD-10-CM | POA: Diagnosis present

## 2019-06-17 DIAGNOSIS — N183 Chronic kidney disease, stage 3 (moderate): Secondary | ICD-10-CM | POA: Diagnosis present

## 2019-06-17 LAB — COMPREHENSIVE METABOLIC PANEL
ALT: 121 U/L — ABNORMAL HIGH (ref 0–44)
AST: 47 U/L — ABNORMAL HIGH (ref 15–41)
Albumin: 3.9 g/dL (ref 3.5–5.0)
Alkaline Phosphatase: 132 U/L — ABNORMAL HIGH (ref 38–126)
Anion gap: 11 (ref 5–15)
BUN: 32 mg/dL — ABNORMAL HIGH (ref 8–23)
CO2: 20 mmol/L — ABNORMAL LOW (ref 22–32)
Calcium: 9.3 mg/dL (ref 8.9–10.3)
Chloride: 102 mmol/L (ref 98–111)
Creatinine, Ser: 1.5 mg/dL — ABNORMAL HIGH (ref 0.61–1.24)
GFR calc Af Amer: 55 mL/min — ABNORMAL LOW (ref >60)
GFR calc non Af Amer: 47 mL/min — ABNORMAL LOW (ref >60)
Glucose, Bld: 143 mg/dL — ABNORMAL HIGH (ref 70–99)
Potassium: 4.5 mmol/L (ref 3.5–5.1)
Sodium: 133 mmol/L — ABNORMAL LOW (ref 135–145)
Total Bilirubin: 0.9 mg/dL (ref 0.3–1.2)
Total Protein: 7.3 g/dL (ref 6.5–8.1)

## 2019-06-17 LAB — CBC
HCT: 36.3 % — ABNORMAL LOW (ref 39.0–52.0)
Hemoglobin: 12.1 g/dL — ABNORMAL LOW (ref 13.0–17.0)
MCH: 30.6 pg (ref 26.0–34.0)
MCHC: 33.3 g/dL (ref 30.0–36.0)
MCV: 91.9 fL (ref 80.0–100.0)
Platelets: 476 10*3/uL — ABNORMAL HIGH (ref 150–400)
RBC: 3.95 MIL/uL — ABNORMAL LOW (ref 4.22–5.81)
RDW: 12.7 % (ref 11.5–15.5)
WBC: 15.5 10*3/uL — ABNORMAL HIGH (ref 4.0–10.5)
nRBC: 0 % (ref 0.0–0.2)

## 2019-06-17 LAB — DIFFERENTIAL
Abs Immature Granulocytes: 0.15 10*3/uL — ABNORMAL HIGH (ref 0.00–0.07)
Basophils Absolute: 0.1 10*3/uL (ref 0.0–0.1)
Basophils Relative: 1 %
Eosinophils Absolute: 0.4 10*3/uL (ref 0.0–0.5)
Eosinophils Relative: 3 %
Immature Granulocytes: 1 %
Lymphocytes Relative: 10 %
Lymphs Abs: 1.5 10*3/uL (ref 0.7–4.0)
Monocytes Absolute: 0.9 10*3/uL (ref 0.1–1.0)
Monocytes Relative: 6 %
Neutro Abs: 12.4 10*3/uL — ABNORMAL HIGH (ref 1.7–7.7)
Neutrophils Relative %: 79 %

## 2019-06-17 LAB — APTT: aPTT: 26 seconds (ref 24–36)

## 2019-06-17 LAB — PROTIME-INR
INR: 1.2 (ref 0.8–1.2)
Prothrombin Time: 14.7 seconds (ref 11.4–15.2)

## 2019-06-17 MED ORDER — SODIUM CHLORIDE 0.9% FLUSH
3.0000 mL | Freq: Once | INTRAVENOUS | Status: AC
Start: 2019-06-17 — End: 2019-06-18
  Administered 2019-06-18: 3 mL via INTRAVENOUS

## 2019-06-17 NOTE — ED Triage Notes (Addendum)
Pt to triage via wheelchair.  Wife noticed facial droop on the right side of face this morning and again 30 minutes ago.  Pt reports he  was  talking out of his head.  No headache.  cabg  2 weeks ago at [facility].  Pt alert speech Clear.

## 2019-06-17 NOTE — ED Provider Notes (Signed)
[facility] Emergency Department Provider Note  ____________________________________________   First MD Initiated Contact with Patient 06/17/19 2046     (approximate)  I have reviewed the triage vital signs and the nursing notes.   HISTORY  Chief Complaint Facial Droop    HPI Andre Gallegos is a 68 y.o. male here with intermittent speech difficulty, right fissure, and questionable right arm weakness.  The patient just underwent three-vessel coronary bypass with Dr. Prescott Gum on 9/9. He has been recovering well. Earlier today,a ruond 9-10 AM, pt had been out for a walk. He came home and was noted to be speaking "gibberish," and had reported that he kept dropping his cane in his R arm. This resolved and he was reportedly at his baseline throughout the day. However, this afternoon around 6, he once again experienced an episode in which his speech became "gibberish" with wrong words and non-sense word mistakes, and he was noted to have R facial droop. This has now resolved. No HA with this. No focal numbness or weakness that he recalls. No recent fever or chills. No CP. No palpitations.       Past Medical History:  Diagnosis Date   Arthritis    CAD (coronary artery disease)    Hyperlipidemia    Myocardial infarction 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   Acute ST elevation myocardial infarction (STEMI) of inferior wall ([place]) 06/01/2019   STEMI involving right coronary artery ([place]) 06/01/2019   Acute kidney injury superimposed on chronic kidney disease ([place])    Hyperkalemia    Advanced care planning/counseling discussion 10/15/2018   Dupuytren's contracture of both hands 10/05/2017   Seborrheic keratosis 10/05/2017   Benign neoplasm of cecum    Hx of colonic polyps    Diverticulosis of large intestine without diverticulitis    First degree hemorrhoids    BPH (benign prostatic  hyperplasia) 09/07/2016   Non-ST elevation myocardial infarction (NSTEMI), subendocardial infarction, subsequent episode of care ([place]) 09/10/2015   Benign essential hypertension 02/12/2015   LVH (left ventricular hypertrophy) due to hypertensive disease, without heart failure 08/27/2014   Coronary artery disease 02/07/2014   Hyperlipemia, mixed 02/07/2014    Past Surgical History:  Procedure Laterality Date   APPENDECTOMY     CARDIAC CATHETERIZATION     COLONOSCOPY WITH PROPOFOL N/A 11/01/2016   Procedure: COLONOSCOPY WITH PROPOFOL;  Surgeon: Jonathon Bellows, MD;  Location: [facility] ENDOSCOPY;  Service: Endoscopy;  Laterality: N/A;   CORONARY ANGIOPLASTY     CORONARY ARTERY BYPASS GRAFT N/A 06/05/2019   Procedure: CORONARY ARTERY BYPASS GRAFTING (CABG) x 3 WITH ENDOSCOPIC HARVESTING OF RIGHT GREATER SAPHENOUS VEIN. LIMA TO LAD.;  Surgeon: Ivin Poot, MD;  Location: [facility];  Service: Open Heart Surgery;  Laterality: N/A;   CORONARY/GRAFT ACUTE MI REVASCULARIZATION N/A 06/01/2019   Procedure: Coronary/Graft Acute MI Revascularization;  Surgeon: Yolonda Kida, MD;  Location: [facility];  Service: Cardiovascular;  Laterality: N/A;   LEFT HEART CATH AND CORONARY ANGIOGRAPHY N/A 06/01/2019   Procedure: LEFT HEART CATH AND CORONARY ANGIOGRAPHY;  Surgeon: Yolonda Kida, MD;  Location: [facility];  Service: Cardiovascular;  Laterality: N/A;   TEE WITHOUT CARDIOVERSION N/A 06/05/2019   Procedure: TRANSESOPHAGEAL ECHOCARDIOGRAM (TEE);  Surgeon: Prescott Gum, Collier Salina, MD;  Location: [facility];  Service: Open Heart Surgery;  Laterality: N/A;    Prior to Admission medications   Medication  Sig Start Date End Date Taking? Authorizing Provider  amLODipine (NORVASC) 10 MG tablet Take 1 tablet (10 mg total) by mouth daily. 10/15/18  Yes Crissman, Jeannette How, MD  aspirin (ASPIRIN EC) 81 MG EC tablet Take 81 mg by mouth daily. Swallow whole.   Yes [provider]  atorvastatin  (LIPITOR) 80 MG tablet Take 1 tablet (80 mg total) by mouth daily at 6 PM. 06/11/19  Yes Gold, Patrick Jupiter E, PA-C  carvedilol (COREG) 12.5 MG tablet Take 1 tablet (12.5 mg total) by mouth 2 (two) times daily with a meal. 06/11/19  Yes Gold, Wayne E, PA-C  clopidogrel (PLAVIX) 75 MG tablet Take 1 tablet (75 mg total) by mouth daily. 06/11/19  Yes Gold, Wayne E, PA-C  ezetimibe (ZETIA) 10 MG tablet Take 1 tablet (10 mg total) by mouth daily. Patient taking differently: Take 10 mg by mouth every evening.  10/15/18  Yes Guadalupe Maple, MD  ferrous sulfate 325 (65 FE) MG tablet Take 1 tablet (325 mg total) by mouth daily with breakfast. 06/12/19  Yes Gold, Wayne E, PA-C  FIBER PO Take 2 tablets by mouth 2 (two) times daily.   Yes [provider]  folic acid (FOLVITE) 1 MG tablet Take 1 tablet (1 mg total) by mouth daily. 06/11/19  Yes Gold, Wayne E, PA-C  Lactobacillus (PROBIOTIC ACIDOPHILUS PO) Take 1 capsule by mouth at bedtime.    Yes [provider]  oxyCODONE (OXY IR/ROXICODONE) 5 MG immediate release tablet Take 1-2 tablets (5-10 mg total) by mouth every 6 (six) hours as needed for up to 7 days for severe pain. 06/11/19 06/18/19 Yes John Giovanni, PA-C    Allergies Patient has no known allergies.  Family History  Problem Relation Age of Onset   Lung cancer Father     Social History Social History   Tobacco Use   Smoking status: Former Smoker    Types: Cigarettes    Quit date: 1990    Years since quitting: 30.7   Smokeless tobacco: Former Systems developer    Types: Snuff   Tobacco comment: quit 30 years ago   Substance Use Topics   Alcohol use: Not Currently    Alcohol/week: 4.0 standard drinks    Types: 4 Cans of beer per week    Comment: occasionally    Drug use: No    Review of Systems  Review of Systems  Constitutional: Negative for chills, fatigue and fever.  HENT: Negative for sore throat.   Respiratory: Negative for shortness of breath.   Cardiovascular: Negative  for chest pain.  Gastrointestinal: Negative for abdominal pain.  Genitourinary: Negative for flank pain.  Musculoskeletal: Negative for neck pain.  Skin: Negative for rash and wound.  Allergic/Immunologic: Negative for immunocompromised state.  Neurological: Positive for facial asymmetry and speech difficulty. Negative for weakness and numbness.  Hematological: Does not bruise/bleed easily.     ____________________________________________  PHYSICAL EXAM:      VITAL SIGNS: ED Triage Vitals  Enc Vitals Group     BP 06/17/19 1932 133/79     Pulse Rate 06/17/19 1932 (!) 102     Resp 06/17/19 1932 20     Temp 06/17/19 1932 98.3 F (36.8 C)     Temp Source 06/17/19 1932 Oral     SpO2 06/17/19 1932 97 %     Weight --      Height --      Head Circumference --      Peak Flow --  Pain Score 06/17/19 1933 0     Pain Loc --      Pain Edu? --      Excl. in [place]? --      Physical Exam Vitals signs and nursing note reviewed.  Constitutional:      General: He is not in acute distress.    Appearance: He is well-developed.  HENT:     Head: Normocephalic and atraumatic.  Eyes:     Conjunctiva/sclera: Conjunctivae normal.  Neck:     Musculoskeletal: Neck supple.  Cardiovascular:     Rate and Rhythm: Normal rate and regular rhythm.     Heart sounds: Normal heart sounds. No murmur. No friction rub.  Pulmonary:     Effort: Pulmonary effort is normal. No respiratory distress.     Breath sounds: Normal breath sounds. No wheezing or rales.  Abdominal:     General: There is no distension.     Palpations: Abdomen is soft.     Tenderness: There is no abdominal tenderness.  Skin:    General: Skin is warm.     Capillary Refill: Capillary refill takes less than 2 seconds.  Neurological:     Mental Status: He is alert and oriented to person, place, and time.     Motor: No abnormal muscle tone.      Neurological Exam:  Mental Status: Alert and oriented to person, place, and time.  Attention and concentration normal. Speech clear. Recent memory is intact. Cranial Nerves: Visual fields grossly intact. EOMI and PERRLA. No nystagmus noted. Facial sensation intact at forehead, maxillary cheek, and chin/mandible bilaterally. No facial asymmetry or weakness. Hearing grossly normal. Uvula is midline, and palate elevates symmetrically. Normal SCM and trapezius strength. Tongue midline without fasciculations. Motor: Muscle strength 5/5 in proximal and distal UE and LE bilaterally. No pronator drift. Muscle tone normal. Reflexes: 2+ and symmetrical in all four extremities.  Sensation: Intact to light touch in upper and lower extremities distally bilaterally.  Gait: Normal without ataxia. Coordination: Normal FTN bilaterally.     ____________________________________________   LABS (all labs ordered are listed, but only abnormal results are displayed)  Labs Reviewed  CBC - Abnormal; Notable for the following components:      Result Value   WBC 15.5 (*)    RBC 3.95 (*)    Hemoglobin 12.1 (*)    HCT 36.3 (*)    Platelets 476 (*)    All other components within normal limits  DIFFERENTIAL - Abnormal; Notable for the following components:   Neutro Abs 12.4 (*)    Abs Immature Granulocytes 0.15 (*)    All other components within normal limits  COMPREHENSIVE METABOLIC PANEL - Abnormal; Notable for the following components:   Sodium 133 (*)    CO2 20 (*)    Glucose, Bld 143 (*)    BUN 32 (*)    Creatinine, Ser 1.50 (*)    AST 47 (*)    ALT 121 (*)    Alkaline Phosphatase 132 (*)    GFR calc non Af Amer 47 (*)    GFR calc Af Amer 55 (*)    All other components within normal limits  PROTIME-INR  APTT    ____________________________________________  EKG: Normal sinus rhythm, ventricular rate 83.  RSR prime.  PR 195, QRS 91, QTc 426.  When compared to prior, evidence of MI is improving. ________________________________________  RADIOLOGY All imaging, including plain  films, CT scans, and ultrasounds, independently reviewed by me, and interpretations confirmed  via formal radiology reads.  ED MD interpretation:   CT Head: [place]  Official radiology report(s): Ct Head Wo Contrast  Result Date: 06/17/2019 CLINICAL DATA:  Episode of right facial droop this morning and again at 7 p.m. this evening. No known injury. EXAM: CT HEAD WITHOUT CONTRAST TECHNIQUE: Contiguous axial images were obtained from the base of the skull through the vertex without intravenous contrast. COMPARISON:  None. FINDINGS: Brain: No evidence of acute infarction, hemorrhage, hydrocephalus, extra-axial collection or mass lesion/mass effect. Vascular: Atherosclerosis noted. Skull: Normal. Negative for fracture or focal lesion. Sinuses/Orbits: Negative. Other: None. IMPRESSION: No acute abnormality. Atherosclerosis. Electronically Signed   By: Inge Rise M.D.   On: 06/17/2019 19:59    ____________________________________________  PROCEDURES   Procedure(s) performed (including Critical Care):  Procedures  ____________________________________________  INITIAL IMPRESSION / MDM / [place] / ED COURSE  As part of my medical decision making, I reviewed the following data within the electronic MEDICAL RECORD NUMBER Notes from prior ED visits and [place] [place] was evaluated in Emergency Department on 06/17/2019 for the symptoms described in the history of present illness. He was evaluated in the context of the global COVID-19 pandemic, which necessitated consideration that the patient might be at risk for infection with the SARS-CoV-2 virus that causes COVID-19. Institutional protocols and algorithms that pertain to the evaluation of patients at risk for COVID-19 are in a state of rapid change based on information released by regulatory bodies including the CDC and federal and state organizations. These policies and algorithms were followed  during the patient's care in the ED.  Some ED evaluations and interventions may be delayed as a result of limited staffing during the pandemic.*   Clinical Course as of Jun 17 2343  Mon Jun 17, 2019  2251 68 yo M with PMH recent CABG 9/9 here with transient R facial droop, ? RUE weakness, and expressive aphasia. Concern for possible TIA. Already on ASA/Plavix. D/w Dr. Kipp Brood of CTS at [facility]- would be OK for heparin if needed, OK to admit here. D/w Dr. Leonel Ramsay of Neuro as well, says it is reasonable to admit here for MR/Neuro C/S.    [CI]    Clinical Course User Index [CI] Duffy Bruce, MD    Medical Decision Making: 68 year old male here with transient expressive aphasia and questionable right facial droop and weakness.  Suspect TIA.  He is already on aspirin and Plavix.  No ongoing neurological deficits.  Per discussion with Dr. Leonel Ramsay, recommends every 2 hours neurochecks overnight and TIA work-up.  MRI ordered. ____________________________________________  FINAL CLINICAL IMPRESSION(S) / ED DIAGNOSES  Final diagnoses:  None     MEDICATIONS GIVEN DURING THIS VISIT:  Medications  sodium chloride flush (NS) 0.9 % injection 3 mL (has no administration in time range)     ED Discharge Orders    None       Note:  This document was prepared using Dragon voice recognition software and may include unintentional dictation errors.   Duffy Bruce, MD 06/17/19 [phone]

## 2019-06-17 NOTE — Assessment & Plan Note (Signed)
The current medical regimen is effective;  continue present plan and medications.  

## 2019-06-17 NOTE — Assessment & Plan Note (Signed)
Discussed medications and care

## 2019-06-17 NOTE — Progress Notes (Addendum)
There were no vitals taken for this visit.   Subjective:    Patient ID: Andre Gallegos, male    DOB: 1951-01-03, 68 y.o.   MRN: [id number]  HPI: Andre Gallegos is a 68 y.o. male  Med check  Hospital F/U Transition of [facility] Follow up.  Discussed with patient extensive care and issues. Discussed in particular cholesterol management and current cholesterol readings.  New dosing of atorvastatin. Discussed also taking Plavix longer than his current prescription but will refill next month at checkup. Discussed repeating CBC because of taking iron. Patient recovering well from heart surgery without problems. Does have a little bit of cough but no edema or PND.  Hospital/Facility:[facility] D/C Physician: Girtha Rm D/C Date: 06-11-19  Records Requested: na Records Received: na Records Reviewed: today  Diagnoses on Discharge: CABG  Date of interactive Contact within 48 hours of discharge:  Contact was through: phone  Date of 7 day or 14 day face-to-face visit:    today  Outpatient Encounter Medications as of 06/17/2019  Medication Sig  . amLODipine (NORVASC) 10 MG tablet Take 1 tablet (10 mg total) by mouth daily.  Marland Kitchen aspirin (ASPIRIN EC) 81 MG EC tablet Take 81 mg by mouth daily. Swallow whole.  Marland Kitchen atorvastatin (LIPITOR) 80 MG tablet Take 1 tablet (80 mg total) by mouth daily at 6 PM.  . carvedilol (COREG) 12.5 MG tablet Take 1 tablet (12.5 mg total) by mouth 2 (two) times daily with a meal.  . clopidogrel (PLAVIX) 75 MG tablet Take 1 tablet (75 mg total) by mouth daily.  Marland Kitchen ezetimibe (ZETIA) 10 MG tablet Take 1 tablet (10 mg total) by mouth daily. (Patient taking differently: Take 10 mg by mouth every evening. )  . ferrous sulfate 325 (65 FE) MG tablet Take 1 tablet (325 mg total) by mouth daily with breakfast.  . FIBER PO Take 2 tablets by mouth 2 (two) times daily.  . folic acid (FOLVITE) 1 MG tablet Take 1 tablet (1 mg total) by mouth daily.  . Lactobacillus (PROBIOTIC  ACIDOPHILUS PO) Take 1 capsule by mouth at bedtime.   Marland Kitchen oxyCODONE (OXY IR/ROXICODONE) 5 MG immediate release tablet Take 1-2 tablets (5-10 mg total) by mouth every 6 (six) hours as needed for up to 7 days for severe pain.   No facility-administered encounter medications on file as of 06/17/2019.     Diagnostic Tests Reviewed/Disposition: reviewed  Consults:na  Discharge Instructions reviewed  Disease/illness Education:doen  Home Health/Community Services Discussions/Referrals:na  Establishment or re-establishment of referral orders for community resources:na  Discussion with other health care providers:na  Assessment and Support of treatment regimen adherence:done  Appointments Coordinated with: na  Education for self-management, independent living, and ADLs:  na   Relevant past medical, surgical, family and social history reviewed and updated as indicated. Interim medical history since our last visit reviewed. Allergies and medications reviewed and updated.  Review of Systems  Constitutional: Negative.   Respiratory: Negative.   Cardiovascular: Negative.     Per HPI unless specifically indicated above     Objective:    There were no vitals taken for this visit.  Wt Readings from Last 3 Encounters:  06/11/19 230 lb 9.6 oz (104.6 kg)  06/01/19 235 lb (106.6 kg)  10/31/18 239 lb 11.2 oz (108.7 kg)    Physical Exam  Results for orders placed or performed during the hospital encounter of 06/01/19  MRSA PCR Screening   Specimen: Nasopharyngeal  Result Value Ref Range  MRSA by PCR NEGATIVE NEGATIVE  Surgical pcr screen   Specimen: Nasal Mucosa; Nasal Swab  Result Value Ref Range   MRSA, PCR NEGATIVE NEGATIVE   Staphylococcus aureus NEGATIVE NEGATIVE  Magnesium  Result Value Ref Range   Magnesium 1.9 1.7 - 2.4 mg/dL  Basic metabolic panel  Result Value Ref Range   Sodium 136 135 - 145 mmol/L   Potassium 4.9 3.5 - 5.1 mmol/L   Chloride 109 98 - 111 mmol/L    CO2 19 (L) 22 - 32 mmol/L   Glucose, Bld 101 (H) 70 - 99 mg/dL   BUN 23 8 - 23 mg/dL   Creatinine, Ser 1.46 (H) 0.61 - 1.24 mg/dL   Calcium 9.1 8.9 - 10.3 mg/dL   GFR calc non Af Amer 49 (L) >60 mL/min   GFR calc Af Amer 57 (L) >60 mL/min   Anion gap 8 5 - 15  TSH  Result Value Ref Range   TSH 2.314 0.350 - 4.500 uIU/mL  Hemoglobin A1c  Result Value Ref Range   Hgb A1c MFr Bld 5.5 4.8 - 5.6 %   Mean Plasma Glucose 111.15 mg/dL  Brain natriuretic peptide  Result Value Ref Range   B Natriuretic Peptide 66.6 0.0 - 100.0 pg/mL  Basic metabolic panel  Result Value Ref Range   Sodium 138 135 - 145 mmol/L   Potassium 4.2 3.5 - 5.1 mmol/L   Chloride 108 98 - 111 mmol/L   CO2 19 (L) 22 - 32 mmol/L   Glucose, Bld 145 (H) 70 - 99 mg/dL   BUN 25 (H) 8 - 23 mg/dL   Creatinine, Ser 1.46 (H) 0.61 - 1.24 mg/dL   Calcium 8.9 8.9 - 10.3 mg/dL   GFR calc non Af Amer 49 (L) >60 mL/min   GFR calc Af Amer 57 (L) >60 mL/min   Anion gap 11 5 - 15  Lipid panel  Result Value Ref Range   Cholesterol 113 0 - 200 mg/dL   Triglycerides 334 (H) <150 mg/dL   HDL 27 (L) >40 mg/dL   Total CHOL/HDL Ratio 4.2 RATIO   VLDL 67 (H) 0 - 40 mg/dL   LDL Cholesterol 19 0 - 99 mg/dL  Heparin level (unfractionated)  Result Value Ref Range   Heparin Unfractionated <0.10 (L) 0.30 - 0.70 IU/mL  CBC  Result Value Ref Range   WBC 13.6 (H) 4.0 - 10.5 K/uL   RBC 4.64 4.22 - 5.81 MIL/uL   Hemoglobin 14.8 13.0 - 17.0 g/dL   HCT 42.5 39.0 - 52.0 %   MCV 91.6 80.0 - 100.0 fL   MCH 31.9 26.0 - 34.0 pg   MCHC 34.8 30.0 - 36.0 g/dL   RDW 12.7 11.5 - 15.5 %   Platelets 197 150 - 400 K/uL   nRBC 0.0 0.0 - 0.2 %  Heparin level (unfractionated)  Result Value Ref Range   Heparin Unfractionated 0.32 0.30 - 0.70 IU/mL  CBC  Result Value Ref Range   WBC 10.9 (H) 4.0 - 10.5 K/uL   RBC 4.40 4.22 - 5.81 MIL/uL   Hemoglobin 14.1 13.0 - 17.0 g/dL   HCT 41.1 39.0 - 52.0 %   MCV 93.4 80.0 - 100.0 fL   MCH 32.0 26.0 - 34.0 pg    MCHC 34.3 30.0 - 36.0 g/dL   RDW 12.8 11.5 - 15.5 %   Platelets 197 150 - 400 K/uL   nRBC 0.0 0.0 - 0.2 %  Glucose, capillary  Result Value Ref  Range   Glucose-Capillary 138 (H) 70 - 99 mg/dL  Heparin level (unfractionated)  Result Value Ref Range   Heparin Unfractionated 0.32 0.30 - 0.70 IU/mL  Glucose, capillary  Result Value Ref Range   Glucose-Capillary 163 (H) 70 - 99 mg/dL  Glucose, capillary  Result Value Ref Range   Glucose-Capillary 136 (H) 70 - 99 mg/dL  Urinalysis, Routine w reflex microscopic  Result Value Ref Range   Color, Urine STRAW (A) YELLOW   APPearance CLEAR CLEAR   Specific Gravity, Urine 1.011 1.005 - 1.030   pH 5.0 5.0 - 8.0   Glucose, UA 50 (A) NEGATIVE mg/dL   Hgb urine dipstick SMALL (A) NEGATIVE   Bilirubin Urine NEGATIVE NEGATIVE   Ketones, ur NEGATIVE NEGATIVE mg/dL   Protein, ur NEGATIVE NEGATIVE mg/dL   Nitrite NEGATIVE NEGATIVE   Leukocytes,Ua NEGATIVE NEGATIVE   RBC / HPF 0-5 0 - 5 RBC/hpf   WBC, UA 0-5 0 - 5 WBC/hpf   Bacteria, UA NONE SEEN NONE SEEN   Mucus PRESENT   Glucose, capillary  Result Value Ref Range   Glucose-Capillary 117 (H) 70 - 99 mg/dL  CBC  Result Value Ref Range   WBC 9.8 4.0 - 10.5 K/uL   RBC 4.37 4.22 - 5.81 MIL/uL   Hemoglobin 13.9 13.0 - 17.0 g/dL   HCT 40.2 39.0 - 52.0 %   MCV 92.0 80.0 - 100.0 fL   MCH 31.8 26.0 - 34.0 pg   MCHC 34.6 30.0 - 36.0 g/dL   RDW 12.6 11.5 - 15.5 %   Platelets 162 150 - 400 K/uL   nRBC 0.0 0.0 - 0.2 %  Heparin level (unfractionated)  Result Value Ref Range   Heparin Unfractionated 0.38 0.30 - 0.70 IU/mL  Comprehensive metabolic panel  Result Value Ref Range   Sodium 137 135 - 145 mmol/L   Potassium 4.4 3.5 - 5.1 mmol/L   Chloride 108 98 - 111 mmol/L   CO2 21 (L) 22 - 32 mmol/L   Glucose, Bld 114 (H) 70 - 99 mg/dL   BUN 21 8 - 23 mg/dL   Creatinine, Ser 1.56 (H) 0.61 - 1.24 mg/dL   Calcium 9.0 8.9 - 10.3 mg/dL   Total Protein 5.9 (L) 6.5 - 8.1 g/dL   Albumin 3.6 3.5 -  5.0 g/dL   AST 63 (H) 15 - 41 U/L   ALT 37 0 - 44 U/L   Alkaline Phosphatase 52 38 - 126 U/L   Total Bilirubin 1.1 0.3 - 1.2 mg/dL   GFR calc non Af Amer 45 (L) >60 mL/min   GFR calc Af Amer 52 (L) >60 mL/min   Anion gap 8 5 - 15  TSH  Result Value Ref Range   TSH 5.052 (H) 0.350 - 4.500 uIU/mL  Protime-INR  Result Value Ref Range   Prothrombin Time 13.7 11.4 - 15.2 seconds   INR 1.1 0.8 - 1.2  Hemoglobin A1c  Result Value Ref Range   Hgb A1c MFr Bld 5.8 (H) 4.8 - 5.6 %   Mean Plasma Glucose 119.76 mg/dL  Platelet inhibition p2y12 (not at [facility])  Result Value Ref Range   Platelet Function  P2Y12 160 (L) 182 - 335 PRU  Glucose, capillary  Result Value Ref Range   Glucose-Capillary 123 (H) 70 - 99 mg/dL  Heparin level (unfractionated)  Result Value Ref Range   Heparin Unfractionated 0.15 (L) 0.30 - 0.70 IU/mL  CBC  Result Value Ref Range   WBC 11.2 (H)  4.0 - 10.5 K/uL   RBC 4.68 4.22 - 5.81 MIL/uL   Hemoglobin 14.8 13.0 - 17.0 g/dL   HCT 43.1 39.0 - 52.0 %   MCV 92.1 80.0 - 100.0 fL   MCH 31.6 26.0 - 34.0 pg   MCHC 34.3 30.0 - 36.0 g/dL   RDW 12.6 11.5 - 15.5 %   Platelets 167 150 - 400 K/uL   nRBC 0.0 0.0 - 0.2 %  Glucose, capillary  Result Value Ref Range   Glucose-Capillary 133 (H) 70 - 99 mg/dL  CBC  Result Value Ref Range   WBC 9.4 4.0 - 10.5 K/uL   RBC 4.21 (L) 4.22 - 5.81 MIL/uL   Hemoglobin 13.4 13.0 - 17.0 g/dL   HCT 39.1 39.0 - 52.0 %   MCV 92.9 80.0 - 100.0 fL   MCH 31.8 26.0 - 34.0 pg   MCHC 34.3 30.0 - 36.0 g/dL   RDW 12.5 11.5 - 15.5 %   Platelets 142 (L) 150 - 400 K/uL   nRBC 0.0 0.0 - 0.2 %  Heparin level (unfractionated)  Result Value Ref Range   Heparin Unfractionated 0.32 0.30 - 0.70 IU/mL  Comprehensive metabolic panel  Result Value Ref Range   Sodium 137 135 - 145 mmol/L   Potassium 4.3 3.5 - 5.1 mmol/L   Chloride 107 98 - 111 mmol/L   CO2 22 22 - 32 mmol/L   Glucose, Bld 113 (H) 70 - 99 mg/dL   BUN 23 8 - 23 mg/dL   Creatinine, Ser  1.45 (H) 0.61 - 1.24 mg/dL   Calcium 8.9 8.9 - 10.3 mg/dL   Total Protein 6.0 (L) 6.5 - 8.1 g/dL   Albumin 3.6 3.5 - 5.0 g/dL   AST 38 15 - 41 U/L   ALT 39 0 - 44 U/L   Alkaline Phosphatase 53 38 - 126 U/L   Total Bilirubin 0.8 0.3 - 1.2 mg/dL   GFR calc non Af Amer 49 (L) >60 mL/min   GFR calc Af Amer 57 (L) >60 mL/min   Anion gap 8 5 - 15  Glucose, capillary  Result Value Ref Range   Glucose-Capillary 154 (H) 70 - 99 mg/dL  Platelet inhibition p2y12 (not at [facility])  Result Value Ref Range   Platelet Function  P2Y12 227 182 - 335 PRU  Glucose, capillary  Result Value Ref Range   Glucose-Capillary 200 (H) 70 - 99 mg/dL  Glucose, capillary  Result Value Ref Range   Glucose-Capillary 91 70 - 99 mg/dL  CBC  Result Value Ref Range   WBC 9.4 4.0 - 10.5 K/uL   RBC 4.13 (L) 4.22 - 5.81 MIL/uL   Hemoglobin 13.2 13.0 - 17.0 g/dL   HCT 37.2 (L) 39.0 - 52.0 %   MCV 90.1 80.0 - 100.0 fL   MCH 32.0 26.0 - 34.0 pg   MCHC 35.5 30.0 - 36.0 g/dL   RDW 12.4 11.5 - 15.5 %   Platelets 144 (L) 150 - 400 K/uL   nRBC 0.0 0.0 - 0.2 %  Glucose, capillary  Result Value Ref Range   Glucose-Capillary 97 70 - 99 mg/dL  CBC  Result Value Ref Range   WBC 10.6 (H) 4.0 - 10.5 K/uL   RBC 4.15 (L) 4.22 - 5.81 MIL/uL   Hemoglobin 13.3 13.0 - 17.0 g/dL   HCT 38.2 (L) 39.0 - 52.0 %   MCV 92.0 80.0 - 100.0 fL   MCH 32.0 26.0 - 34.0 pg   MCHC 34.8  30.0 - 36.0 g/dL   RDW 12.5 11.5 - 15.5 %   Platelets 163 150 - 400 K/uL   nRBC 0.0 0.0 - 0.2 %  Heparin level (unfractionated)  Result Value Ref Range   Heparin Unfractionated 0.39 0.30 - 0.70 IU/mL  Basic metabolic panel  Result Value Ref Range   Sodium 137 135 - 145 mmol/L   Potassium 4.4 3.5 - 5.1 mmol/L   Chloride 108 98 - 111 mmol/L   CO2 22 22 - 32 mmol/L   Glucose, Bld 114 (H) 70 - 99 mg/dL   BUN 20 8 - 23 mg/dL   Creatinine, Ser 1.35 (H) 0.61 - 1.24 mg/dL   Calcium 8.8 (L) 8.9 - 10.3 mg/dL   GFR calc non Af Amer 54 (L) >60 mL/min   GFR calc  Af Amer >60 >60 mL/min   Anion gap 7 5 - 15  Glucose, capillary  Result Value Ref Range   Glucose-Capillary 116 (H) 70 - 99 mg/dL  Hemoglobin and hematocrit, blood  Result Value Ref Range   Hemoglobin 9.4 (L) 13.0 - 17.0 g/dL   HCT 27.4 (L) 39.0 - 52.0 %  Platelet count  Result Value Ref Range   Platelets 125 (L) 150 - 400 K/uL  CBC  Result Value Ref Range   WBC 15.3 (H) 4.0 - 10.5 K/uL   RBC 3.06 (L) 4.22 - 5.81 MIL/uL   Hemoglobin 9.7 (L) 13.0 - 17.0 g/dL   HCT 28.6 (L) 39.0 - 52.0 %   MCV 93.5 80.0 - 100.0 fL   MCH 31.7 26.0 - 34.0 pg   MCHC 33.9 30.0 - 36.0 g/dL   RDW 12.4 11.5 - 15.5 %   Platelets 110 (L) 150 - 400 K/uL   nRBC 0.0 0.0 - 0.2 %  Protime-INR  Result Value Ref Range   Prothrombin Time 17.1 (H) 11.4 - 15.2 seconds   INR 1.4 (H) 0.8 - 1.2  APTT  Result Value Ref Range   aPTT 27 24 - 36 seconds  Glucose, capillary  Result Value Ref Range   Glucose-Capillary 113 (H) 70 - 99 mg/dL  CBC  Result Value Ref Range   WBC 11.5 (H) 4.0 - 10.5 K/uL   RBC 2.88 (L) 4.22 - 5.81 MIL/uL   Hemoglobin 9.2 (L) 13.0 - 17.0 g/dL   HCT 27.1 (L) 39.0 - 52.0 %   MCV 94.1 80.0 - 100.0 fL   MCH 31.9 26.0 - 34.0 pg   MCHC 33.9 30.0 - 36.0 g/dL   RDW 12.6 11.5 - 15.5 %   Platelets 106 (L) 150 - 400 K/uL   nRBC 0.0 0.0 - 0.2 %  Basic metabolic panel  Result Value Ref Range   Sodium 135 135 - 145 mmol/L   Potassium 4.5 3.5 - 5.1 mmol/L   Chloride 105 98 - 111 mmol/L   CO2 22 22 - 32 mmol/L   Glucose, Bld 114 (H) 70 - 99 mg/dL   BUN 19 8 - 23 mg/dL   Creatinine, Ser 1.68 (H) 0.61 - 1.24 mg/dL   Calcium 8.1 (L) 8.9 - 10.3 mg/dL   GFR calc non Af Amer 41 (L) >60 mL/min   GFR calc Af Amer 48 (L) >60 mL/min   Anion gap 8 5 - 15  Magnesium  Result Value Ref Range   Magnesium 2.6 (H) 1.7 - 2.4 mg/dL  Basic metabolic panel  Result Value Ref Range   Sodium 137 135 - 145 mmol/L   Potassium 4.7  3.5 - 5.1 mmol/L   Chloride 108 98 - 111 mmol/L   CO2 22 22 - 32 mmol/L    Glucose, Bld 143 (H) 70 - 99 mg/dL   BUN 16 8 - 23 mg/dL   Creatinine, Ser 1.35 (H) 0.61 - 1.24 mg/dL   Calcium 8.4 (L) 8.9 - 10.3 mg/dL   GFR calc non Af Amer 54 (L) >60 mL/min   GFR calc Af Amer >60 >60 mL/min   Anion gap 7 5 - 15  CBC  Result Value Ref Range   WBC 12.2 (H) 4.0 - 10.5 K/uL   RBC 3.13 (L) 4.22 - 5.81 MIL/uL   Hemoglobin 10.1 (L) 13.0 - 17.0 g/dL   HCT 28.6 (L) 39.0 - 52.0 %   MCV 91.4 80.0 - 100.0 fL   MCH 32.3 26.0 - 34.0 pg   MCHC 35.3 30.0 - 36.0 g/dL   RDW 12.5 11.5 - 15.5 %   Platelets 115 (L) 150 - 400 K/uL   nRBC 0.0 0.0 - 0.2 %  Magnesium  Result Value Ref Range   Magnesium 2.7 (H) 1.7 - 2.4 mg/dL  Glucose, capillary  Result Value Ref Range   Glucose-Capillary 126 (H) 70 - 99 mg/dL  Glucose, capillary  Result Value Ref Range   Glucose-Capillary 121 (H) 70 - 99 mg/dL  Glucose, capillary  Result Value Ref Range   Glucose-Capillary 134 (H) 70 - 99 mg/dL  Glucose, capillary  Result Value Ref Range   Glucose-Capillary 146 (H) 70 - 99 mg/dL  Glucose, capillary  Result Value Ref Range   Glucose-Capillary 171 (H) 70 - 99 mg/dL  Glucose, capillary  Result Value Ref Range   Glucose-Capillary 150 (H) 70 - 99 mg/dL  Basic metabolic panel  Result Value Ref Range   Sodium 133 (L) 135 - 145 mmol/L   Potassium 4.8 3.5 - 5.1 mmol/L   Chloride 100 98 - 111 mmol/L   CO2 23 22 - 32 mmol/L   Glucose, Bld 152 (H) 70 - 99 mg/dL   BUN 23 8 - 23 mg/dL   Creatinine, Ser 1.76 (H) 0.61 - 1.24 mg/dL   Calcium 8.2 (L) 8.9 - 10.3 mg/dL   GFR calc non Af Amer 39 (L) >60 mL/min   GFR calc Af Amer 45 (L) >60 mL/min   Anion gap 10 5 - 15  Magnesium  Result Value Ref Range   Magnesium 2.5 (H) 1.7 - 2.4 mg/dL  CBC  Result Value Ref Range   WBC 19.3 (H) 4.0 - 10.5 K/uL   RBC 3.10 (L) 4.22 - 5.81 MIL/uL   Hemoglobin 10.1 (L) 13.0 - 17.0 g/dL   HCT 29.2 (L) 39.0 - 52.0 %   MCV 94.2 80.0 - 100.0 fL   MCH 32.6 26.0 - 34.0 pg   MCHC 34.6 30.0 - 36.0 g/dL   RDW 13.0  11.5 - 15.5 %   Platelets 131 (L) 150 - 400 K/uL   nRBC 0.0 0.0 - 0.2 %  Glucose, capillary  Result Value Ref Range   Glucose-Capillary 132 (H) 70 - 99 mg/dL  Glucose, capillary  Result Value Ref Range   Glucose-Capillary 119 (H) 70 - 99 mg/dL  Glucose, capillary  Result Value Ref Range   Glucose-Capillary 120 (H) 70 - 99 mg/dL  Glucose, capillary  Result Value Ref Range   Glucose-Capillary 125 (H) 70 - 99 mg/dL  Glucose, capillary  Result Value Ref Range   Glucose-Capillary 116 (H) 70 - 99 mg/dL  Glucose,  capillary  Result Value Ref Range   Glucose-Capillary 112 (H) 70 - 99 mg/dL  Glucose, capillary  Result Value Ref Range   Glucose-Capillary 125 (H) 70 - 99 mg/dL  Glucose, capillary  Result Value Ref Range   Glucose-Capillary 130 (H) 70 - 99 mg/dL  Glucose, capillary  Result Value Ref Range   Glucose-Capillary 121 (H) 70 - 99 mg/dL  Glucose, capillary  Result Value Ref Range   Glucose-Capillary 127 (H) 70 - 99 mg/dL  Glucose, capillary  Result Value Ref Range   Glucose-Capillary 136 (H) 70 - 99 mg/dL  Glucose, capillary  Result Value Ref Range   Glucose-Capillary 114 (H) 70 - 99 mg/dL  Glucose, capillary  Result Value Ref Range   Glucose-Capillary 135 (H) 70 - 99 mg/dL  Glucose, capillary  Result Value Ref Range   Glucose-Capillary 140 (H) 70 - 99 mg/dL  Basic metabolic panel  Result Value Ref Range   Sodium 132 (L) 135 - 145 mmol/L   Potassium 4.8 3.5 - 5.1 mmol/L   Chloride 100 98 - 111 mmol/L   CO2 24 22 - 32 mmol/L   Glucose, Bld 157 (H) 70 - 99 mg/dL   BUN 28 (H) 8 - 23 mg/dL   Creatinine, Ser 1.81 (H) 0.61 - 1.24 mg/dL   Calcium 8.4 (L) 8.9 - 10.3 mg/dL   GFR calc non Af Amer 38 (L) >60 mL/min   GFR calc Af Amer 44 (L) >60 mL/min   Anion gap 8 5 - 15  CBC  Result Value Ref Range   WBC 18.5 (H) 4.0 - 10.5 K/uL   RBC 2.95 (L) 4.22 - 5.81 MIL/uL   Hemoglobin 9.4 (L) 13.0 - 17.0 g/dL   HCT 28.2 (L) 39.0 - 52.0 %   MCV 95.6 80.0 - 100.0 fL   MCH  31.9 26.0 - 34.0 pg   MCHC 33.3 30.0 - 36.0 g/dL   RDW 13.1 11.5 - 15.5 %   Platelets 132 (L) 150 - 400 K/uL   nRBC 0.1 0.0 - 0.2 %  Cooxemetry Panel (carboxy, met, total hgb, O2 sat)  Result Value Ref Range   Total hemoglobin 9.8 (L) 12.0 - 16.0 g/dL   O2 Saturation 54.1 %   Carboxyhemoglobin 1.8 (H) 0.5 - 1.5 %   Methemoglobin 1.3 0.0 - 1.5 %  Glucose, capillary  Result Value Ref Range   Glucose-Capillary 158 (H) 70 - 99 mg/dL  Glucose, capillary  Result Value Ref Range   Glucose-Capillary 136 (H) 70 - 99 mg/dL  Glucose, capillary  Result Value Ref Range   Glucose-Capillary 147 (H) 70 - 99 mg/dL  Glucose, capillary  Result Value Ref Range   Glucose-Capillary 159 (H) 70 - 99 mg/dL  Glucose, capillary  Result Value Ref Range   Glucose-Capillary 160 (H) 70 - 99 mg/dL  Glucose, capillary  Result Value Ref Range   Glucose-Capillary 148 (H) 70 - 99 mg/dL   Comment 1 Notify RN    Comment 2 Document in Chart   Cooxemetry Panel (carboxy, met, total hgb, O2 sat)  Result Value Ref Range   Total hemoglobin 8.6 (L) 12.0 - 16.0 g/dL   O2 Saturation 75.7 %   Carboxyhemoglobin 1.8 (H) 0.5 - 1.5 %   Methemoglobin 1.4 0.0 - 1.5 %  CBC  Result Value Ref Range   WBC 11.1 (H) 4.0 - 10.5 K/uL   RBC 2.64 (L) 4.22 - 5.81 MIL/uL   Hemoglobin 8.3 (L) 13.0 - 17.0 g/dL  HCT 24.9 (L) 39.0 - 52.0 %   MCV 94.3 80.0 - 100.0 fL   MCH 31.4 26.0 - 34.0 pg   MCHC 33.3 30.0 - 36.0 g/dL   RDW 12.8 11.5 - 15.5 %   Platelets 126 (L) 150 - 400 K/uL   nRBC 0.0 0.0 - 0.2 %  Basic metabolic panel  Result Value Ref Range   Sodium 132 (L) 135 - 145 mmol/L   Potassium 4.3 3.5 - 5.1 mmol/L   Chloride 99 98 - 111 mmol/L   CO2 26 22 - 32 mmol/L   Glucose, Bld 147 (H) 70 - 99 mg/dL   BUN 28 (H) 8 - 23 mg/dL   Creatinine, Ser 1.69 (H) 0.61 - 1.24 mg/dL   Calcium 8.5 (L) 8.9 - 10.3 mg/dL   GFR calc non Af Amer 41 (L) >60 mL/min   GFR calc Af Amer 48 (L) >60 mL/min   Anion gap 7 5 - 15  Glucose, capillary   Result Value Ref Range   Glucose-Capillary 149 (H) 70 - 99 mg/dL  Glucose, capillary  Result Value Ref Range   Glucose-Capillary 126 (H) 70 - 99 mg/dL   Comment 1 Notify RN   Glucose, capillary  Result Value Ref Range   Glucose-Capillary 142 (H) 70 - 99 mg/dL  Glucose, capillary  Result Value Ref Range   Glucose-Capillary 151 (H) 70 - 99 mg/dL   Comment 1 Notify RN    Comment 2 Document in Chart   Basic metabolic panel  Result Value Ref Range   Sodium 136 135 - 145 mmol/L   Potassium 4.0 3.5 - 5.1 mmol/L   Chloride 100 98 - 111 mmol/L   CO2 27 22 - 32 mmol/L   Glucose, Bld 121 (H) 70 - 99 mg/dL   BUN 26 (H) 8 - 23 mg/dL   Creatinine, Ser 1.47 (H) 0.61 - 1.24 mg/dL   Calcium 8.5 (L) 8.9 - 10.3 mg/dL   GFR calc non Af Amer 49 (L) >60 mL/min   GFR calc Af Amer 56 (L) >60 mL/min   Anion gap 9 5 - 15  CBC  Result Value Ref Range   WBC 9.4 4.0 - 10.5 K/uL   RBC 2.69 (L) 4.22 - 5.81 MIL/uL   Hemoglobin 8.5 (L) 13.0 - 17.0 g/dL   HCT 25.3 (L) 39.0 - 52.0 %   MCV 94.1 80.0 - 100.0 fL   MCH 31.6 26.0 - 34.0 pg   MCHC 33.6 30.0 - 36.0 g/dL   RDW 13.2 11.5 - 15.5 %   Platelets 157 150 - 400 K/uL   nRBC 0.2 0.0 - 0.2 %  Glucose, capillary  Result Value Ref Range   Glucose-Capillary 121 (H) 70 - 99 mg/dL  Glucose, capillary  Result Value Ref Range   Glucose-Capillary 122 (H) 70 - 99 mg/dL  Glucose, capillary  Result Value Ref Range   Glucose-Capillary 132 (H) 70 - 99 mg/dL   Comment 1 Notify RN    Comment 2 Document in Chart   Glucose, capillary  Result Value Ref Range   Glucose-Capillary 121 (H) 70 - 99 mg/dL  Basic metabolic panel  Result Value Ref Range   Sodium 137 135 - 145 mmol/L   Potassium 3.9 3.5 - 5.1 mmol/L   Chloride 100 98 - 111 mmol/L   CO2 27 22 - 32 mmol/L   Glucose, Bld 117 (H) 70 - 99 mg/dL   BUN 25 (H) 8 - 23 mg/dL   Creatinine,  Ser 1.53 (H) 0.61 - 1.24 mg/dL   Calcium 8.7 (L) 8.9 - 10.3 mg/dL   GFR calc non Af Amer 46 (L) >60 mL/min   GFR calc  Af Amer 54 (L) >60 mL/min   Anion gap 10 5 - 15  Glucose, capillary  Result Value Ref Range   Glucose-Capillary 119 (H) 70 - 99 mg/dL  Glucose, capillary  Result Value Ref Range   Glucose-Capillary 118 (H) 70 - 99 mg/dL  Glucose, capillary  Result Value Ref Range   Glucose-Capillary 108 (H) 70 - 99 mg/dL   Comment 1 Notify RN    Comment 2 Document in Chart   Glucose, capillary  Result Value Ref Range   Glucose-Capillary 113 (H) 70 - 99 mg/dL   Comment 1 Notify RN    Comment 2 Document in Chart   Basic metabolic panel  Result Value Ref Range   Sodium 137 135 - 145 mmol/L   Potassium 4.4 3.5 - 5.1 mmol/L   Chloride 101 98 - 111 mmol/L   CO2 24 22 - 32 mmol/L   Glucose, Bld 120 (H) 70 - 99 mg/dL   BUN 24 (H) 8 - 23 mg/dL   Creatinine, Ser 1.44 (H) 0.61 - 1.24 mg/dL   Calcium 8.9 8.9 - 10.3 mg/dL   GFR calc non Af Amer 50 (L) >60 mL/min   GFR calc Af Amer 58 (L) >60 mL/min   Anion gap 12 5 - 15  Glucose, capillary  Result Value Ref Range   Glucose-Capillary 112 (H) 70 - 99 mg/dL   Comment 1 Notify RN    Comment 2 Document in Chart   POCT Activated clotting time  Result Value Ref Range   Activated Clotting Time 114 seconds  I-STAT 4, (NA,K, GLUC, HGB,HCT)  Result Value Ref Range   Sodium 138 135 - 145 mmol/L   Potassium 4.8 3.5 - 5.1 mmol/L   Glucose, Bld 115 (H) 70 - 99 mg/dL   HCT 34.0 (L) 39.0 - 52.0 %   Hemoglobin 11.6 (L) 13.0 - 17.0 g/dL  I-STAT 4, (NA,K, GLUC, HGB,HCT)  Result Value Ref Range   Sodium 139 135 - 145 mmol/L   Potassium 4.9 3.5 - 5.1 mmol/L   Glucose, Bld 111 (H) 70 - 99 mg/dL   HCT 34.0 (L) 39.0 - 52.0 %   Hemoglobin 11.6 (L) 13.0 - 17.0 g/dL  I-STAT 7, (LYTES, BLD GAS, ICA, H+H)  Result Value Ref Range   pH, Arterial 7.341 (L) 7.350 - 7.450   pCO2 arterial 49.8 (H) 32.0 - 48.0 mmHg   pO2, Arterial 411.0 (H) 83.0 - 108.0 mmHg   Bicarbonate 26.9 20.0 - 28.0 mmol/L   TCO2 28 22 - 32 mmol/L   O2 Saturation 100.0 %   Acid-Base Excess 1.0 0.0 -  2.0 mmol/L   Sodium 140 135 - 145 mmol/L   Potassium 5.0 3.5 - 5.1 mmol/L   Calcium, Ion 1.06 (L) 1.15 - 1.40 mmol/L   HCT 28.0 (L) 39.0 - 52.0 %   Hemoglobin 9.5 (L) 13.0 - 17.0 g/dL   Patient temperature HIDE    Sample type CARDIOPULMONARY BYPASS   I-STAT 4, (NA,K, GLUC, HGB,HCT)  Result Value Ref Range   Sodium 140 135 - 145 mmol/L   Potassium 5.0 3.5 - 5.1 mmol/L   Glucose, Bld 97 70 - 99 mg/dL   HCT 30.0 (L) 39.0 - 52.0 %   Hemoglobin 10.2 (L) 13.0 - 17.0 g/dL  I-STAT 4, (NA,K, GLUC,  HGB,HCT)  Result Value Ref Range   Sodium 134 (L) 135 - 145 mmol/L   Potassium 6.1 (H) 3.5 - 5.1 mmol/L   Glucose, Bld 128 (H) 70 - 99 mg/dL   HCT 26.0 (L) 39.0 - 52.0 %   Hemoglobin 8.8 (L) 13.0 - 17.0 g/dL  I-STAT 4, (NA,K, GLUC, HGB,HCT)  Result Value Ref Range   Sodium 133 (L) 135 - 145 mmol/L   Potassium 6.9 (HH) 3.5 - 5.1 mmol/L   Glucose, Bld 144 (H) 70 - 99 mg/dL   HCT 27.0 (L) 39.0 - 52.0 %   Hemoglobin 9.2 (L) 13.0 - 17.0 g/dL  I-STAT 4, (NA,K, GLUC, HGB,HCT)  Result Value Ref Range   Sodium 132 (L) 135 - 145 mmol/L   Potassium 6.4 (HH) 3.5 - 5.1 mmol/L   Glucose, Bld 184 (H) 70 - 99 mg/dL   HCT 24.0 (L) 39.0 - 52.0 %   Hemoglobin 8.2 (L) 13.0 - 17.0 g/dL  I-STAT 7, (LYTES, BLD GAS, ICA, H+H)  Result Value Ref Range   pH, Arterial 7.316 (L) 7.350 - 7.450   pCO2 arterial 44.2 32.0 - 48.0 mmHg   pO2, Arterial 139.0 (H) 83.0 - 108.0 mmHg   Bicarbonate 22.6 20.0 - 28.0 mmol/L   TCO2 24 22 - 32 mmol/L   O2 Saturation 99.0 %   Acid-base deficit 3.0 (H) 0.0 - 2.0 mmol/L   Sodium 139 135 - 145 mmol/L   Potassium 4.8 3.5 - 5.1 mmol/L   Calcium, Ion 1.46 (H) 1.15 - 1.40 mmol/L   HCT 25.0 (L) 39.0 - 52.0 %   Hemoglobin 8.5 (L) 13.0 - 17.0 g/dL   Patient temperature HIDE    Sample type CARDIOPULMONARY BYPASS   I-STAT 4, (NA,K, GLUC, HGB,HCT)  Result Value Ref Range   Sodium 137 135 - 145 mmol/L   Potassium 4.8 3.5 - 5.1 mmol/L   Glucose, Bld 137 (H) 70 - 99 mg/dL   HCT 27.0 (L)  39.0 - 52.0 %   Hemoglobin 9.2 (L) 13.0 - 17.0 g/dL  I-STAT 4, (NA,K, GLUC, HGB,HCT)  Result Value Ref Range   Sodium 139 135 - 145 mmol/L   Potassium 4.3 3.5 - 5.1 mmol/L   Glucose, Bld 95 70 - 99 mg/dL   HCT 27.0 (L) 39.0 - 52.0 %   Hemoglobin 9.2 (L) 13.0 - 17.0 g/dL  I-STAT 7, (LYTES, BLD GAS, ICA, H+H)  Result Value Ref Range   pH, Arterial 7.345 (L) 7.350 - 7.450   pCO2 arterial 39.8 32.0 - 48.0 mmHg   pO2, Arterial 82.0 (L) 83.0 - 108.0 mmHg   Bicarbonate 22.0 20.0 - 28.0 mmol/L   TCO2 23 22 - 32 mmol/L   O2 Saturation 96.0 %   Acid-base deficit 4.0 (H) 0.0 - 2.0 mmol/L   Sodium 142 135 - 145 mmol/L   Potassium 4.2 3.5 - 5.1 mmol/L   Calcium, Ion 1.27 1.15 - 1.40 mmol/L   HCT 27.0 (L) 39.0 - 52.0 %   Hemoglobin 9.2 (L) 13.0 - 17.0 g/dL   Patient temperature 35.8 C    Sample type CARDIOPULMONARY BYPASS   I-STAT 7, (LYTES, BLD GAS, ICA, H+H)  Result Value Ref Range   pH, Arterial 7.345 (L) 7.350 - 7.450   pCO2 arterial 37.5 32.0 - 48.0 mmHg   pO2, Arterial 75.0 (L) 83.0 - 108.0 mmHg   Bicarbonate 20.4 20.0 - 28.0 mmol/L   TCO2 22 22 - 32 mmol/L   O2 Saturation 94.0 %  Acid-base deficit 5.0 (H) 0.0 - 2.0 mmol/L   Sodium 139 135 - 145 mmol/L   Potassium 4.7 3.5 - 5.1 mmol/L   Calcium, Ion 1.26 1.15 - 1.40 mmol/L   HCT 27.0 (L) 39.0 - 52.0 %   Hemoglobin 9.2 (L) 13.0 - 17.0 g/dL   Patient temperature 37.2 C    Sample type ARTERIAL   I-STAT 7, (LYTES, BLD GAS, ICA, H+H)  Result Value Ref Range   pH, Arterial 7.381 7.350 - 7.450   pCO2 arterial 37.7 32.0 - 48.0 mmHg   pO2, Arterial 71.0 (L) 83.0 - 108.0 mmHg   Bicarbonate 22.1 20.0 - 28.0 mmol/L   TCO2 23 22 - 32 mmol/L   O2 Saturation 93.0 %   Acid-base deficit 2.0 0.0 - 2.0 mmol/L   Sodium 139 135 - 145 mmol/L   Potassium 4.7 3.5 - 5.1 mmol/L   Calcium, Ion 1.22 1.15 - 1.40 mmol/L   HCT 27.0 (L) 39.0 - 52.0 %   Hemoglobin 9.2 (L) 13.0 - 17.0 g/dL   Patient temperature 37.9 C    Sample type ARTERIAL    ECHOCARDIOGRAM COMPLETE  Result Value Ref Range   Weight 3,763.69 oz   Height 72 in   BP 140/54 mmHg  ECHO INTRAOPERATIVE TEE  Result Value Ref Range   Weight 3,756.64 oz   Height 72 in   BP 130/67 mmHg  Type and screen [place]  Result Value Ref Range   ABO/RH(D) A POS    Antibody Screen NEG    Sample Expiration 06/07/2019,2359    Unit Number [id number]    Blood Component Type RED CELLS,LR    Unit division 00    Status of Unit REL FROM [facility]    Transfusion Status OK TO TRANSFUSE    Crossmatch Result Compatible    Unit Number [id number]    Blood Component Type RED CELLS,LR    Unit division 00    Status of Unit REL FROM [facility]    Transfusion Status OK TO TRANSFUSE    Crossmatch Result      Compatible Performed at [facility], [address], [place], [place] [address]    Unit Number [id number]    Blood Component Type RED CELLS,LR    Unit division 00    Status of Unit REL FROM [facility]    Transfusion Status OK TO TRANSFUSE    Crossmatch Result Compatible    Unit Number [id number]    Blood Component Type RBC LR PHER2    Unit division 00    Status of Unit REL FROM [facility]    Transfusion Status OK TO TRANSFUSE    Crossmatch Result Compatible   Prepare RBC (crossmatch)  Result Value Ref Range   Order Confirmation      ORDER PROCESSED BY BLOOD BANK Performed at [facility], [address], [place], [place] [address]   ABO/Rh  Result Value Ref Range   ABO/RH(D)      A POS Performed at [facility] [address], [place], [place] [address]   Prepare RBC (crossmatch)  Result Value Ref Range   Order Confirmation      ORDER PROCESSED BY BLOOD BANK Performed at [facility], [place] [address], [place], [place] [address]   BPAM [facility]  Result Value Ref Range   ISSUE DATE / TIME 726203559741    Blood Product Unit Number [id number]    PRODUCT CODE Z2248G50    Unit Type  and Rh 6200    Blood Product Expiration Date  202010022359    ISSUE DATE / TIME [id number]    Blood Product Unit Number [id number]    PRODUCT CODE C3762G31    Unit Type and Rh 5176    Blood Product Expiration Date 160737106269    ISSUE DATE / TIME 485462703500    Blood Product Unit Number [id number]    PRODUCT CODE C7893Y10    Unit Type and Rh 1751    Blood Product Expiration Date 025852778242    ISSUE DATE / TIME 353614431540    Blood Product Unit Number [id number]    PRODUCT CODE Z1245Y09    Unit Type and Rh 6200    Blood Product Expiration Date 983382505397   Troponin I (High Sensitivity)  Result Value Ref Range   Troponin I (High Sensitivity) 7,033 (HH) <18 ng/L      Assessment & Plan:   Problem List Items Addressed This Visit      Cardiovascular and Mediastinum   Benign essential hypertension    The current medical regimen is effective;  continue present plan and medications.       Relevant Orders   Comprehensive metabolic panel   Non-ST elevation myocardial infarction (NSTEMI), subendocardial infarction, subsequent episode of care [facility])    The current medical regimen is effective;  continue present plan and medications.       Relevant Orders   Comprehensive metabolic panel   CBC with Differential/Platelet     Other   Hyperlipemia, mixed    Discussed medications and care      Relevant Orders   Lipid panel   S/P CABG x 3    The current medical regimen is effective;  continue present plan and medications.       Relevant Orders   Comprehensive metabolic panel   Lipid panel   CBC with Differential/Platelet     Telemedicine using audio/video telecommunications for a synchronous communication visit. Today's visit due to COVID-19 isolation precautions I connected with and verified that I am speaking with the correct person using two identifiers.   I discussed the limitations, risks, security and privacy concerns of performing an evaluation and management service by telecommunication and the  availability of in person appointments. I also discussed with the patient that there may be a patient responsible charge related to this service. The patient expressed understanding and agreed to proceed. The patient's location is home. I am at home.   I discussed the assessment and treatment plan with the patient. The patient was provided an opportunity to ask questions and all were answered. The patient agreed with the plan and demonstrated an understanding of the instructions.   The patient was advised to call back or seek an in-person evaluation if the symptoms worsen or if the condition fails to improve as anticipated.   I provided 21+ minutes of time during this encounter.  Follow up plan: Return in about 4 weeks (around 07/15/2019) for CMP, Lipids, CBC.

## 2019-06-18 ENCOUNTER — Observation Stay: Payer: Medicare Other

## 2019-06-18 ENCOUNTER — Encounter: Payer: Self-pay | Admitting: *Deleted

## 2019-06-18 ENCOUNTER — Observation Stay (HOSPITAL_BASED_OUTPATIENT_CLINIC_OR_DEPARTMENT_OTHER)
Admit: 2019-06-18 | Discharge: 2019-06-18 | Disposition: A | Discharge status: Home / Self Care | Payer: Medicare Other | Attending: Internal Medicine | Admitting: Internal Medicine

## 2019-06-18 ENCOUNTER — Other Ambulatory Visit: Payer: Self-pay

## 2019-06-18 DIAGNOSIS — I639 Cerebral infarction, unspecified: Principal | ICD-10-CM

## 2019-06-18 DIAGNOSIS — G459 Transient cerebral ischemic attack, unspecified: Secondary | ICD-10-CM

## 2019-06-18 LAB — LIPID PANEL
Cholesterol: 100 mg/dL (ref 0–200)
HDL: 23 mg/dL — ABNORMAL LOW (ref >40)
LDL Cholesterol: 60 mg/dL (ref 0–99)
Total CHOL/HDL Ratio: 4.3 RATIO
Triglycerides: 86 mg/dL (ref <150)
VLDL: 17 mg/dL (ref 0–40)

## 2019-06-18 LAB — SARS CORONAVIRUS 2 (TAT 6-24 HRS): SARS Coronavirus 2: NEGATIVE

## 2019-06-18 LAB — ECHOCARDIOGRAM COMPLETE
Height: 72 in
Weight: 3473.6 oz

## 2019-06-18 MED ORDER — CLOPIDOGREL BISULFATE 75 MG PO TABS
75.0000 mg | ORAL_TABLET | Freq: Every day | ORAL | Status: DC
  Administered 2019-06-18 – 2019-06-19 (×2): 75 mg via ORAL
  Filled 2019-06-18 (×2): qty 1

## 2019-06-18 MED ORDER — ATORVASTATIN CALCIUM 80 MG PO TABS
80.0000 mg | ORAL_TABLET | Freq: Every day | ORAL | Status: DC
  Administered 2019-06-18: 18:00:00 80 mg via ORAL
  Filled 2019-06-18: qty 1

## 2019-06-18 MED ORDER — STROKE: EARLY STAGES OF RECOVERY BOOK
Freq: Once | Status: AC
  Administered 2019-06-18: 04:00:00

## 2019-06-18 MED ORDER — SODIUM CHLORIDE 0.9 % IV SOLN
INTRAVENOUS | Status: AC
  Administered 2019-06-18: 17:00:00 via INTRAVENOUS

## 2019-06-18 MED ORDER — ASPIRIN EC 81 MG PO TBEC
81.0000 mg | DELAYED_RELEASE_TABLET | Freq: Every day | ORAL | Status: DC
  Administered 2019-06-18 – 2019-06-19 (×2): 81 mg via ORAL
  Filled 2019-06-18 (×2): qty 1

## 2019-06-18 MED ORDER — PERFLUTREN LIPID MICROSPHERE
1.0000 mL | INTRAVENOUS | Status: AC | PRN
  Administered 2019-06-18: 2 mL via INTRAVENOUS
  Filled 2019-06-18: qty 10

## 2019-06-18 MED ORDER — ACETAMINOPHEN 160 MG/5ML PO SOLN
650.0000 mg | ORAL | Status: DC | PRN
  Filled 2019-06-18: qty 20.3

## 2019-06-18 MED ORDER — ACETAMINOPHEN 650 MG RE SUPP: 650.0000 mg | RECTAL | Status: DC | PRN

## 2019-06-18 MED ORDER — ENOXAPARIN SODIUM 40 MG/0.4ML ~~LOC~~ SOLN
40.0000 mg | SUBCUTANEOUS | Status: DC
  Administered 2019-06-18 – 2019-06-19 (×2): 40 mg via SUBCUTANEOUS
  Filled 2019-06-18 (×2): qty 0.4

## 2019-06-18 MED ORDER — ACETAMINOPHEN 325 MG PO TABS: 650.0000 mg | ORAL_TABLET | ORAL | Status: DC | PRN

## 2019-06-18 MED ORDER — IOHEXOL 350 MG/ML SOLN
75.0000 mL | Freq: Once | INTRAVENOUS | Status: AC | PRN
  Administered 2019-06-18: 75 mL via INTRAVENOUS

## 2019-06-18 NOTE — ED Notes (Signed)
ED TO INPATIENT HANDOFF REPORT  ED Nurse Name and Phone #:   Gershon Mussel RN  [phone]  S Name/Age/Gender Andre Gallegos 68 y.o. male Room/Bed: ED10A/ED10A  Code Status   Code Status: Prior  Home/SNF/Other Home Patient oriented to: self, place, time and situation Is this baseline? Yes   Triage Complete: Triage complete  Chief Complaint Facial Droop  Triage Note Pt to triage via wheelchair.  Wife noticed facial droop on the right side of face this morning and again 30 minutes ago.  Pt reports he  was  talking out of his head.  No headache.  cabg  2 weeks ago at [facility].  Pt alert speech Clear.       Allergies No Known Allergies  Level of Care/Admitting Diagnosis ED Disposition    ED Disposition Condition [facility] Area: [place] [100120]  Level of Care: Med-Surg [16]  Covid Evaluation: Asymptomatic Screening Protocol (No Symptoms)  Diagnosis: TIA (transient ischemic attackPP:800902  Admitting Physician: Lance Coon [id number]  Attending Physician: Lance Coon [id number]  PT Class (Do Not Modify): Observation [104]  PT Acc Code (Do Not Modify): Observation [10022]       B Medical/Surgery History Past Medical History:  Diagnosis Date  . Arthritis   . CAD (coronary artery disease)   . Hyperlipidemia   . Myocardial infarction [facility]) 2005   Past Surgical History:  Procedure Laterality Date  . APPENDECTOMY    . CARDIAC CATHETERIZATION    . COLONOSCOPY WITH PROPOFOL N/A 11/01/2016   Procedure: COLONOSCOPY WITH PROPOFOL;  Surgeon: Jonathon Bellows, MD;  Location: [facility] ENDOSCOPY;  Service: Endoscopy;  Laterality: N/A;  . CORONARY ANGIOPLASTY    . CORONARY ARTERY BYPASS GRAFT N/A 06/05/2019   Procedure: CORONARY ARTERY BYPASS GRAFTING (CABG) x 3 WITH ENDOSCOPIC HARVESTING OF RIGHT GREATER SAPHENOUS VEIN. LIMA TO LAD.;  Surgeon: Ivin Poot, MD;  Location: [facility];  Service: Open Heart Surgery;  Laterality: N/A;  . CORONARY/GRAFT ACUTE MI  REVASCULARIZATION N/A 06/01/2019   Procedure: Coronary/Graft Acute MI Revascularization;  Surgeon: Yolonda Kida, MD;  Location: [place] CV LAB;  Service: Cardiovascular;  Laterality: N/A;  . LEFT HEART CATH AND CORONARY ANGIOGRAPHY N/A 06/01/2019   Procedure: LEFT HEART CATH AND CORONARY ANGIOGRAPHY;  Surgeon: Yolonda Kida, MD;  Location: [place] CV LAB;  Service: Cardiovascular;  Laterality: N/A;  . TEE WITHOUT CARDIOVERSION N/A 06/05/2019   Procedure: TRANSESOPHAGEAL ECHOCARDIOGRAM (TEE);  Surgeon: Prescott Gum, Collier Salina, MD;  Location: [place];  Service: Open Heart Surgery;  Laterality: N/A;     A IV Location/Drains/Wounds Patient Lines/Drains/Airways Status   Active Line/Drains/Airways    Name:   Placement date:   Placement time:   Site:   Days:   Peripheral IV 06/17/19 Left Antecubital   06/17/19    2239    Antecubital   1   Incision (Closed) 06/05/19 Leg Right   06/05/19    1205     13   Incision (Closed) 06/05/19 Chest Other (Comment)   06/05/19    1339     13          Intake/Output Last 24 hours No intake or output data in the 24 hours ending 06/18/19 0226  Labs/Imaging Results for orders placed or performed during the hospital encounter of 06/17/19 (from the past 48 hour(s))  Protime-INR     Status: None   Collection Time: 06/17/19  7:39 PM  Result Value Ref Range  Prothrombin Time 14.7 11.4 - 15.2 seconds   INR 1.2 0.8 - 1.2    Comment: (NOTE) INR goal varies based on device and disease states. Performed at [facility], [address] [place], [place] [address]   APTT     Status: None   Collection Time: 06/17/19  7:39 PM  Result Value Ref Range   aPTT 26 24 - 36 seconds    Comment: Performed at [facility], [address] [place], [place] [address]  CBC     Status: Abnormal   Collection Time: 06/17/19  7:39 PM  Result Value Ref Range   WBC 15.5 (H) 4.0 - 10.5 K/uL   RBC 3.95 (L) 4.22 - 5.81 MIL/uL   Hemoglobin 12.1 (L) 13.0 -  17.0 g/dL   HCT 36.3 (L) 39.0 - 52.0 %   MCV 91.9 80.0 - 100.0 fL   MCH 30.6 26.0 - 34.0 pg   MCHC 33.3 30.0 - 36.0 g/dL   RDW 12.7 11.5 - 15.5 %   Platelets 476 (H) 150 - 400 K/uL   nRBC 0.0 0.0 - 0.2 %    Comment: Performed at [facility], [address] [place], [place] [address]  Differential     Status: Abnormal   Collection Time: 06/17/19  7:39 PM  Result Value Ref Range   Neutrophils Relative % 79 %   Neutro Abs 12.4 (H) 1.7 - 7.7 K/uL   Lymphocytes Relative 10 %   Lymphs Abs 1.5 0.7 - 4.0 K/uL   Monocytes Relative 6 %   Monocytes Absolute 0.9 0.1 - 1.0 K/uL   Eosinophils Relative 3 %   Eosinophils Absolute 0.4 0.0 - 0.5 K/uL   Basophils Relative 1 %   Basophils Absolute 0.1 0.0 - 0.1 K/uL   Immature Granulocytes 1 %   Abs Immature Granulocytes 0.15 (H) 0.00 - 0.07 K/uL    Comment: Performed at [facility], [address] [place], [place] [address]  Comprehensive metabolic panel     Status: Abnormal   Collection Time: 06/17/19  7:39 PM  Result Value Ref Range   Sodium 133 (L) 135 - 145 mmol/L   Potassium 4.5 3.5 - 5.1 mmol/L   Chloride 102 98 - 111 mmol/L   CO2 20 (L) 22 - 32 mmol/L   Glucose, Bld 143 (H) 70 - 99 mg/dL   BUN 32 (H) 8 - 23 mg/dL   Creatinine, Ser 1.50 (H) 0.61 - 1.24 mg/dL   Calcium 9.3 8.9 - 10.3 mg/dL   Total Protein 7.3 6.5 - 8.1 g/dL   Albumin 3.9 3.5 - 5.0 g/dL   AST 47 (H) 15 - 41 U/L   ALT 121 (H) 0 - 44 U/L   Alkaline Phosphatase 132 (H) 38 - 126 U/L   Total Bilirubin 0.9 0.3 - 1.2 mg/dL   GFR calc non Af Amer 47 (L) >60 mL/min   GFR calc Af Amer 55 (L) >60 mL/min   Anion gap 11 5 - 15    Comment: Performed at [facility], [address] [place], [place] [address]   Ct Head Wo Contrast  Result Date: 06/17/2019 CLINICAL DATA:  Episode of right facial droop this morning and again at 7 p.m. this evening. No known injury. EXAM: CT HEAD WITHOUT CONTRAST TECHNIQUE: Contiguous axial images were obtained from  the base of the skull through the vertex without intravenous contrast. COMPARISON:  None. FINDINGS: Brain: No evidence of acute infarction, hemorrhage, hydrocephalus, extra-axial  collection or mass lesion/mass effect. Vascular: Atherosclerosis noted. Skull: Normal. Negative for fracture or focal lesion. Sinuses/Orbits: Negative. Other: None. IMPRESSION: No acute abnormality. Atherosclerosis. Electronically Signed   By: Inge Rise M.D.   On: 06/17/2019 19:59   Mr Brain Wo Contrast  Result Date: 06/18/2019 CLINICAL DATA:  Initial evaluation for transient aphasia. EXAM: MRI HEAD WITHOUT CONTRAST TECHNIQUE: Multiplanar, multiecho pulse sequences of the brain and surrounding structures were obtained without intravenous contrast. COMPARISON:  Prior CT from 06/17/2019. FINDINGS: Brain: Cerebral volume within normal limits. Patchy T2/FLAIR hyperintensity within the supratentorial cerebral white matter, nonspecific, but most like related chronic small vessel ischemic disease, mild in nature. Few small patchy subcentimeter foci of restricted diffusion seen involving the ventromedial left thalamus, consistent with acute to early subacute ischemic infarcts (series 5, images 27, 26, 24). No associated hemorrhage or mass effect. No other evidence for acute or subacute ischemia. Gray-white matter differentiation otherwise maintained. No evidence for remote cortical infarction. No foci of susceptibility artifact to suggest acute or chronic intracranial hemorrhage. No mass lesion, midline shift or mass effect. No hydrocephalus. No extra-axial fluid collection. Pituitary gland suprasellar region normal. Vascular: Major intracranial vascular flow voids are maintained. Skull and upper cervical spine: Craniocervical junction within normal limits. No focal marrow replacing lesion. Small lipoma noted at the scalp vertex. Sinuses/Orbits: Globes and orbital soft tissues within normal limits. Paranasal sinuses are clear. Trace  right mastoid effusion, of doubtful significance. Negative nasopharynx. Other: None. IMPRESSION: 1. 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 No associated hemorrhage or mass effect. 2. No other acute intracranial abnormality. 3. Mild chronic microvascular ischemic disease. Electronically Signed   By: Jeannine Boga M.D.   On: 06/18/2019 02:16    Pending Labs Unresulted Labs (From admission, onward)    Start     Ordered   06/17/19 2346  SARS CORONAVIRUS 2 (TAT 6-24 HRS) Nasopharyngeal Nasopharyngeal Swab  (Asymptomatic/Tier 2 Patients Labs)  Once,   STAT    Question Answer Comment  Is this test for diagnosis or screening Screening   Symptomatic for COVID-19 as defined by CDC No   Hospitalized for COVID-19 No   Admitted to ICU for COVID-19 No   Previously tested for COVID-19 Yes   Resident in a congregate (group) care setting No   Employed in healthcare setting No      06/17/19 2345   Signed and Held  HIV antibody (Routine Testing)  Once,   R     Signed and Held   Signed and Held  Hemoglobin A1c  Tomorrow morning,   R     Signed and Held   Signed and Held  Lipid panel  Tomorrow morning,   R    Comments: Fasting    Signed and Held   Signed and Held  CBC  (enoxaparin (LOVENOX)    CrCl >/= 30 ml/min)  Once,   R    Comments: Baseline for enoxaparin therapy IF NOT ALREADY DRAWN.  Notify MD if PLT < 100 K.    Signed and Held   Signed and Held  Creatinine, serum  (enoxaparin (LOVENOX)    CrCl >/= 30 ml/min)  Once,   R    Comments: Baseline for enoxaparin therapy IF NOT ALREADY DRAWN.    Signed and Held   Signed and Held  Creatinine, serum  (enoxaparin (LOVENOX)    CrCl >/= 30 ml/min)  Weekly,   R  Comments: while on enoxaparin therapy    Signed and Held          Vitals/Pain Today's Vitals   06/17/19 2330 06/18/19 0000 06/18/19 0030 06/18/19 0224  BP: 121/74 123/72  133/75 140/69  Pulse: 85 88 84 86  Resp: 13 15 15 18   Temp:      TempSrc:      SpO2: 96% 96% 96% 96%  PainSc:    0-No pain    Isolation Precautions No active isolations  Medications Medications  sodium chloride flush (NS) 0.9 % injection 3 mL (has no administration in time range)    Mobility walks Moderate fall risk   Focused Assessments Neuro Assessment Handoff:  Swallow screen pass? Yes    NIH Stroke Scale ( + Modified Stroke Scale Criteria)  Interval: Initial Level of Consciousness (1a.)   : Alert, keenly responsive LOC Questions (1b. )   +: Answers both questions correctly LOC Commands (1c. )   + : Performs both tasks correctly Best Gaze (2. )  +: Normal Visual (3. )  +: No visual loss Facial Palsy (4. )    : Minor paralysis Motor Arm, Left (5a. )   +: No drift Motor Arm, Right (5b. )   +: No drift Motor Leg, Left (6a. )   +: No drift Motor Leg, Right (6b. )   +: No drift Limb Ataxia (7. ): Absent Sensory (8. )   +: Normal, no sensory loss Best Language (9. )   +: No aphasia Dysarthria (10. ): Normal Extinction/Inattention (11.)   +: No Abnormality Modified SS Total  +: 0 Complete NIHSS TOTAL: 1 Last date known well: 06/17/19 Last time known well: 1000 Neuro Assessment:   Neuro Checks:   Initial (06/17/19 2140)  Last Documented NIHSS Modified Score: 0 (06/17/19 2140) Has TPA been given? No If patient is a Neuro Trauma and patient is going to OR before floor call report to [place] nurse: [phone] or [phone]     R Recommendations: See Admitting Provider Note  Report given to:   Additional Notes:

## 2019-06-18 NOTE — Progress Notes (Signed)
SLP Cancellation Note  Patient Details Name: Andre Gallegos MRN: [id number] DOB: 02/26/1951   Cancelled treatment:       Reason Eval/Treat Not Completed: SLP screened, no needs identified, will sign off(chart reviewed; consulted pt, NSG) Pt denied any difficulty swallowing and is currently on a regular diet; tolerates swallowing pills w/ water per NSG. Pt conversed at conversational level w/ SLP w/out deficits noted; pt and NSG denied any current speech-language deficits.  No further skilled ST services indicated as pt appears at his baseline. Pt agreed. NSG to reconsult if any change in status while admitted.      Orinda Kenner, MS, CCC-SLP Watson,Katherine 06/18/2019, 10:23 AM

## 2019-06-18 NOTE — Progress Notes (Signed)
Cardiovascular and Pulmonary Nurse Navigator Note:    68 year old male with PMHx of CAD s/p CABG x 2 weeks ago, HTN, HLD, arthritis, anemia, CKD, who was admitted for transient change in speech yesterday.    Patient ruled in for acute stroke - no residual symptoms now. MRI revealed tiny left thalamic infarcts.  Patient on ASA, Plavix, and statin.  Neurology following.    Cardiac Rehab Education provided:   This RN rounded on patient to provide information on Cardiac Rehab program here at [facility].  Referral sent from Dr. Daneen Schick and Dr. Prescott Gum.  Patient resting in bed watching television.  Wife at bedside in [place].  Discussed Cardiac Rehab with patient and wife.  Patient informed this RN that he has participated in the program here at [facility] in the past.  Informed the patient all CABG patients are referred to outpatient Cardiac Rehab.  Patient's response, "Well, if that is what is recommended for me to do, then that is what I will do."    Cardiac Rehab brochure and informational letter with CPT billing codes provided to patient and wife.  Explained to patient the Cardiac Rehab dept will contact him within approximately 4 weeks to schedule his first appointment.  (Note:  It has now been two weeks since CABG.)  Explained to patient and wife the changes in the program relating to COVID-19 pandemic.  Patient and wife verbalized understanding.  Patient aware of need to wear a mask when coming to the program.    Patient and wife appreciative of the above information.    Roanna Epley, RN, BSN, [place] [facility]  Cardiovascular & Pulmonary Nurse Navigator  Direct Line: [phone]  Department Phone #: [phone] Fax: [phone]  Email Address: [email or web address]

## 2019-06-18 NOTE — Evaluation (Signed)
Physical Therapy Evaluation Patient Details Name: Andre Gallegos MRN: [id number] DOB: Sep 17, 1951 Today's Date: 06/18/2019   History of Present Illness  68 y.o. male who presents with chief complaint as above.  Patient presents the ED after 2 episodes of a aphasia lasting for about 30 to 40 minutes each.  At time of PT exam he was not having any symptoms and reports that those he had were very minimal - feeling back to baseline. Pt is 2 weeks post open-heart surgery.  Clinical Impression  Pt did well with PT exam and showed ability to return home w/o need for further follow up or safety concerns.  He displayed good strength and mobility, has no residual weakness, confusion, aphasia, etc and is feeling back to his baseline.  His resting HR was a little high in the 90s but stayed stable with activity, O2 in the high 90s on room air t/o effort.  Overall pt did well and does not have further concerns, will sign off.      Follow Up Recommendations No PT follow up    Equipment Recommendations  None recommended by PT    Recommendations for Other Services       Precautions / Restrictions Precautions Precautions: None Restrictions Weight Bearing Restrictions: No      Mobility  Bed Mobility Overal bed mobility: Independent             General bed mobility comments: Pt able to easily get to EOB w/o hesitation or issue  Transfers Overall transfer level: Independent Equipment used: None             General transfer comment: Pt able to maintain balance standing at EOB w/o issue  Ambulation/Gait Ambulation/Gait assistance: Independent Gait Distance (Feet): 250 Feet Assistive device: None       General Gait Details: Pt with occasional veering/stagger step but was able to maintain consistent speed and cadence.  He reports he has been using a SPC around the home recently secondary to recent surgery.  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)       Balance                                             Pertinent Vitals/Pain Pain Assessment: No/denies pain    Home Living Family/patient expects to be discharged to:: Private residence Living Arrangements: Spouse/significant other;Children Available Help at Discharge: Family Type of Home: Apartment Home Access: Stairs to enter Entrance Stairs-Rails: Right Entrance Stairs-Number of Steps: 3 Home Layout: Laundry or work area in basement;Able to live on main level with bedroom/bathroom Home Equipment: Radio producer - single point      Prior Function Level of Independence: Independent               Hand Dominance        Extremity/Trunk Assessment   Upper Extremity Assessment Upper Extremity Assessment: Overall WFL for tasks assessed(deferred heavy resistance secondary to open-heart sx)    Lower Extremity Assessment Lower Extremity Assessment: Overall WFL for tasks assessed(equal bilaterally, )       Communication   Communication: No difficulties  Cognition   Behavior During Therapy: WFL for tasks assessed/performed Overall Cognitive Status: Within Functional Limits for tasks  assessed                                        General Comments      Exercises     Assessment/Plan    PT Assessment Patent does not need any further PT services  PT Problem List         PT Treatment Interventions      PT Goals (Current goals can be found in the Care Plan section)  Acute Rehab PT Goals Patient Stated Goal: go home PT Goal Formulation: All assessment and education complete, DC therapy    Frequency     Barriers to discharge        Co-evaluation               AM-PAC PT "6 Clicks" Mobility  Outcome Measure Help needed turning from your back to your side while in a flat bed without using bedrails?: None Help needed moving from lying on your  back to sitting on the side of a flat bed without using bedrails?: None Help needed moving to and from a bed to a chair (including a wheelchair)?: None Help needed standing up from a chair using your arms (e.g., wheelchair or bedside chair)?: None Help needed to walk in hospital room?: None Help needed climbing 3-5 steps with a railing? : None 6 Click Score: 24    End of Session Equipment Utilized During Treatment: Gait belt Activity Tolerance: Patient tolerated treatment well Patient left: in chair;with call bell/phone within reach Nurse Communication: Mobility status PT Visit Diagnosis: Difficulty in walking, not elsewhere classified (R26.2);Muscle weakness (generalized) (M62.81)    Time: [id number] PT Time Calculation (min) (ACUTE ONLY): 18 min   Charges:   PT Evaluation $PT Eval Low Complexity: 1 Low          Kreg Shropshire, DPT 06/18/2019, 8:47 AM

## 2019-06-18 NOTE — Progress Notes (Signed)
[facility] NAME: Andre Gallegos    MR#:  [id number]  DATE OF BIRTH:  08-27-1951  SUBJECTIVE:  CHIEF COMPLAINT:   Chief Complaint  Patient presents with   Facial Droop   -Came in with speech change, patient denies any complaints at this time.  His wife noticed a change in his speech.  Patient denies any facial droop.  REVIEW OF SYSTEMS:  Review of Systems  Constitutional: Negative for chills, fever and malaise/fatigue.  HENT: Negative for hearing loss.   Eyes: Negative for blurred vision and double vision.  Respiratory: Negative for cough, shortness of breath and wheezing.   Cardiovascular: Negative for chest pain, palpitations and leg swelling.  Gastrointestinal: Negative for abdominal pain, constipation, diarrhea, nausea and vomiting.  Genitourinary: Negative for dysuria.  Neurological: Positive for speech change. Negative for dizziness, seizures, weakness and headaches.  Psychiatric/Behavioral: Negative for depression.    DRUG ALLERGIES:  No Known Allergies  VITALS:  Blood pressure 131/73, pulse 82, temperature 98.6 F (37 C), temperature source Oral, resp. rate 18, height 6' (1.829 m), weight 98.5 kg, SpO2 97 %.  PHYSICAL EXAMINATION:  Physical Exam  GENERAL:  68 y.o.-year-old patient lying in the bed with no acute distress.  EYES: Pupils equal, round, reactive to light and accommodation. No scleral icterus. Extraocular muscles intact.  HEENT: Head atraumatic, normocephalic. Oropharynx and nasopharynx clear.  NECK:  Supple, no jugular venous distention. No thyroid enlargement, no tenderness.  LUNGS: Normal breath sounds bilaterally, no wheezing, rales,rhonchi or crepitation. No use of accessory muscles of respiration.  CARDIOVASCULAR: S1, S2 normal.  Nicely healing CABG scar noted.  No murmurs, rubs, or gallops.  ABDOMEN: Soft, nontender, nondistended. Bowel sounds present. No organomegaly or mass.  EXTREMITIES:  No pedal edema, cyanosis, or clubbing.  NEUROLOGIC: Cranial nerves II through XII are intact. Muscle strength 5/5 in all extremities. Sensation intact. Gait not checked.  PSYCHIATRIC: The patient is alert and oriented x 3.  SKIN: No obvious rash, lesion, or ulcer.    LABORATORY PANEL:   CBC Recent Labs  Lab 06/17/19 1939  WBC 15.5*  HGB 12.1*  HCT 36.3*  PLT 476*   ------------------------------------------------------------------------------------------------------------------  Chemistries  Recent Labs  Lab 06/17/19 1939  NA 133*  K 4.5  CL 102  CO2 20*  GLUCOSE 143*  BUN 32*  CREATININE 1.50*  CALCIUM 9.3  AST 47*  ALT 121*  ALKPHOS 132*  BILITOT 0.9   ------------------------------------------------------------------------------------------------------------------  Cardiac Enzymes No results for input(s): TROPONINI in the last 168 hours. ------------------------------------------------------------------------------------------------------------------  RADIOLOGY:  Ct Head Wo Contrast  Result Date: 06/17/2019 CLINICAL DATA:  Episode of right facial droop this morning and again at 7 p.m. this evening. No known injury. EXAM: CT HEAD WITHOUT CONTRAST TECHNIQUE: Contiguous axial images were obtained from the base of the skull through the vertex without intravenous contrast. COMPARISON:  None. FINDINGS: Brain: No evidence of acute infarction, hemorrhage, hydrocephalus, extra-axial collection or mass lesion/mass effect. Vascular: Atherosclerosis noted. Skull: Normal. Negative for fracture or focal lesion. Sinuses/Orbits: Negative. Other: None. IMPRESSION: No acute abnormality. Atherosclerosis. Electronically Signed   By: Inge Rise M.D.   On: 06/17/2019 19:59   Mr Brain Wo Contrast  Result Date: 06/18/2019 CLINICAL DATA:  Initial evaluation for transient aphasia. EXAM: MRI HEAD WITHOUT CONTRAST TECHNIQUE: Multiplanar, multiecho pulse sequences of the brain and  surrounding structures were obtained without intravenous contrast. COMPARISON:  Prior CT from 06/17/2019. FINDINGS: Brain: Cerebral volume within normal limits.  Patchy T2/FLAIR hyperintensity within the supratentorial cerebral white matter, nonspecific, but most like related chronic small vessel ischemic disease, mild in nature. Few small patchy subcentimeter foci of restricted diffusion seen involving the ventromedial left thalamus, consistent with acute to early subacute ischemic infarcts (series 5, images 27, 26, 24). No associated hemorrhage or mass effect. No other evidence for acute or subacute ischemia. Gray-white matter differentiation otherwise maintained. No evidence for remote cortical infarction. No foci of susceptibility artifact to suggest acute or chronic intracranial hemorrhage. No mass lesion, midline shift or mass effect. No hydrocephalus. No extra-axial fluid collection. Pituitary gland suprasellar region normal. Vascular: Major intracranial vascular flow voids are maintained. Skull and upper cervical spine: Craniocervical junction within normal limits. No focal marrow replacing lesion. Small lipoma noted at the scalp vertex. Sinuses/Orbits: Globes and orbital soft tissues within normal limits. Paranasal sinuses are clear. Trace right mastoid effusion, of doubtful significance. Negative nasopharynx. Other: None. IMPRESSION: 1. 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 No associated hemorrhage or mass effect. 2. No other acute intracranial abnormality. 3. Mild chronic microvascular ischemic disease. Electronically Signed   By: Jeannine Boga M.D.   On: 06/18/2019 02:16   US Carotid Bilateral (at [facility] Only)  Result Date: 06/18/2019 CLINICAL DATA:  68 year old male EXAM: BILATERAL CAROTID DUPLEX ULTRASOUND TECHNIQUE: Pearline Cables scale imaging, color Doppler and duplex ultrasound were  performed of bilateral carotid and vertebral arteries in the neck. COMPARISON:  None. FINDINGS: Criteria: Quantification of carotid stenosis is based on velocity parameters that correlate the residual internal carotid diameter with NASCET-based stenosis levels, using the diameter of the distal internal carotid lumen as the denominator for stenosis measurement. The following velocity measurements were obtained: RIGHT ICA:  Systolic [id number] cm/sec, Diastolic 28 cm/sec CCA:  81 cm/sec SYSTOLIC ICA/CCA RATIO:  1.3 ECA:  136 cm/sec LEFT ICA:  Systolic 123XX123 cm/sec, Diastolic 30 cm/sec CCA:  78 cm/sec SYSTOLIC ICA/CCA RATIO:  1.5 ECA:  106 cm/sec Right Brachial SBP: Not acquired Left Brachial SBP: Not acquired RIGHT CAROTID ARTERY: No significant calcifications of the right common carotid artery. Intermediate waveform maintained. Heterogeneous and partially calcified plaque at the right carotid bifurcation. No significant lumen shadowing. Low resistance waveform of the right ICA. No significant tortuosity. RIGHT VERTEBRAL ARTERY: Antegrade flow with low resistance waveform. LEFT CAROTID ARTERY: No significant calcifications of the left common carotid artery. Intermediate waveform maintained. Heterogeneous and partially calcified plaque at the left carotid bifurcation without significant lumen shadowing. Low resistance waveform of the left ICA. No significant tortuosity. LEFT VERTEBRAL ARTERY:  Antegrade flow with low resistance waveform. IMPRESSION: Color duplex indicates minimal heterogeneous and calcified plaque, with no hemodynamically significant stenosis by duplex criteria in the extracranial cerebrovascular circulation. Signed, Dulcy Fanny. Dellia Nims, RPVI [facility] Radiology Electronically Signed   By: Corrie Mckusick D.O.   On: 06/18/2019 09:44    EKG:   Orders placed or performed during the hospital encounter of 06/17/19   EKG 12-Lead   EKG 12-Lead   ED EKG    ED EKG    ASSESSMENT AND PLAN:   68 y/o M with PMH significant for CAD s/p CABG 2 weeks ago, HTN, hyperlipidemia, arthritis, anemia, CKD admitted for transient change in his speech yesterday.,  1. Acute stroke- no residual symptoms now - MRI with tiny left thalamic infarcts. Carotid dopplers with no hemodynamically significant stenosis. ECHO pending - CT angio of  head and neck per neurology - patient already on asa, plavix and statin - appreciate neuro input - PT/OT and speech consults appreciated- no needs identified  2. Hypertension- norvasc, coreg,   3. CKD stage3- stable  4. Anemia of chronic disease- on iron supplements  5. CAD- h/o prior stents, recent CABG 2 weeks ago - no symptoms. Continue all current cardiac medications  6. DVT prophylaxis- lovenox   All the records are reviewed and case discussed with Care Management/Social Workerr. Management plans discussed with the patient, family and they are in agreement.  CODE STATUS: Full code  TOTAL TIME TAKING CARE OF THIS PATIENT: 38 minutes.   POSSIBLE D/C IN 1-2  DAYS, DEPENDING ON CLINICAL CONDITION.   Gladstone Lighter M.D on 06/18/2019 at 1:38 PM  Between 7am to 6pm - Pager - [phone]  After 6pm go to [email or web address] - password [email or web address] [facility]  Office  [phone]  CC: Primary care physician; Guadalupe Maple, MD

## 2019-06-18 NOTE — Consult Note (Signed)
Referring Physician: Tressia Miners    Chief Complaint: Episodes of difficulty with speech  HPI: Andre Gallegos is an 68 y.o. male with a history of CAD s/p CABG two weeks ago who presents after 2 episodes of a aphasia lasting for about 30 to 40 minutes each.  The first was yesterday morning, the second was yesterday evening.  The episode yesterday evening was also accompanied by some facial droop as reported by his wife, and the patient thinks perhaps some right upper extremity weakness.  All the symptoms spontaneously resolved.   Initial NIHSS of 0.  Date last known well: Date: 06/17/2019 Time last known well: Time: 10:00 tPA Given: No: Resolution of symptoms  Past Medical History:  Diagnosis Date  . Arthritis   . CAD (coronary artery disease)   . Hyperlipidemia   . Myocardial infarction [facility]) 2005    Past Surgical History:  Procedure Laterality Date  . APPENDECTOMY    . CARDIAC CATHETERIZATION    . COLONOSCOPY WITH PROPOFOL N/A 11/01/2016   Procedure: COLONOSCOPY WITH PROPOFOL;  Surgeon: Jonathon Bellows, MD;  Location: [facility] END[facility];  Service: Endoscopy;  Laterality: N/A;  . CORONARY ANGIOPLASTY    . CORONARY ARTERY BYPASS GRAFT N/A 06/05/2019   Procedure: CORONARY ARTERY BYPASS GRAFTING (CABG) x 3 WITH ENDOSCOPIC HARVESTING OF RIGHT GREATER SAPHENOUS VEIN. LIMA TO LAD.;  Surgeon: Ivin Poot, MD;  Location: [place];  Service: Open Heart Surgery;  Laterality: N/A;  . CORONARY/GRAFT ACUTE MI REVASCULARIZATION N/A 06/01/2019   Procedure: Coronary/Graft Acute MI Revascularization;  Surgeon: Yolonda Kida, MD;  Location: [place] [facility];  Service: Cardiovascular;  Laterality: N/A;  . LEFT HEART CATH AND CORONARY ANGIOGRAPHY N/A 06/01/2019   Procedure: LEFT HEART CATH AND CORONARY ANGIOGRAPHY;  Surgeon: Yolonda Kida, MD;  Location: [place] [facility];  Service: Cardiovascular;  Laterality: N/A;  . TEE WITHOUT CARDIOVERSION N/A 06/05/2019   Procedure: TRANSESOPHAGEAL ECHOCARDIOGRAM  (TEE);  Surgeon: Prescott Gum, Collier Salina, MD;  Location: [place];  Service: Open Heart Surgery;  Laterality: N/A;    Family History  Problem Relation Age of Onset  . Lung cancer Father    Social History:  reports that he quit smoking about 30 years ago. His smoking use included cigarettes. He has quit using smokeless tobacco.  His smokeless tobacco use included snuff. He reports previous alcohol use of about 4.0 standard drinks of alcohol per week. He reports that he does not use drugs.  Allergies: No Known Allergies  Medications:  I have reviewed the patient's current medications. Prior to Admission:  Medications Prior to Admission  Medication Sig Dispense Refill Last Dose  . amLODipine (NORVASC) 10 MG tablet Take 1 tablet (10 mg total) by mouth daily. 90 tablet 4 06/17/2019 at Unknown time  . aspirin (ASPIRIN EC) 81 MG EC tablet Take 81 mg by mouth daily. Swallow whole.   06/17/2019 at Unknown time  . atorvastatin (LIPITOR) 80 MG tablet Take 1 tablet (80 mg total) by mouth daily at 6 PM. 30 tablet 1 06/17/2019 at 1900  . carvedilol (COREG) 12.5 MG tablet Take 1 tablet (12.5 mg total) by mouth 2 (two) times daily with a meal. 60 tablet 1 06/17/2019 at 1900  . clopidogrel (PLAVIX) 75 MG tablet Take 1 tablet (75 mg total) by mouth daily. 30 tablet 1 06/17/2019 at Unknown time  . ezetimibe (ZETIA) 10 MG tablet Take 1 tablet (10 mg total) by mouth daily. (Patient taking differently: Take 10 mg by mouth every evening. )  90 tablet 4 06/17/2019 at 1900  . ferrous sulfate 325 (65 FE) MG tablet Take 1 tablet (325 mg total) by mouth daily with breakfast. 30 tablet 3 06/17/2019 at Unknown time  . FIBER PO Take 2 tablets by mouth 2 (two) times daily.   06/17/2019 at Unknown time  . folic acid (FOLVITE) 1 MG tablet Take 1 tablet (1 mg total) by mouth daily. 30 tablet 3 06/17/2019 at Unknown time  . Lactobacillus (PROBIOTIC ACIDOPHILUS PO) Take 1 capsule by mouth at bedtime.    06/17/2019 at Unknown time  . oxyCODONE (OXY  IR/ROXICODONE) 5 MG immediate release tablet Take 1-2 tablets (5-10 mg total) by mouth every 6 (six) hours as needed for up to 7 days for severe pain. 28 tablet 0 prn at prn   Scheduled: . aspirin EC  81 mg Oral Daily  . atorvastatin  80 mg Oral q1800  . clopidogrel  75 mg Oral Daily  . enoxaparin (LOVENOX) injection  40 mg Subcutaneous Q24H    ROS: History obtained from the patient  General ROS: negative for - chills, fatigue, fever, night sweats, weight gain or weight loss Psychological ROS: negative for - behavioral disorder, hallucinations, memory difficulties, mood swings or suicidal ideation Ophthalmic ROS: negative for - blurry vision, double vision, eye pain or loss of vision ENT ROS: negative for - epistaxis, nasal discharge, oral lesions, sore throat, tinnitus or vertigo Allergy and Immunology ROS: negative for - hives or itchy/watery eyes Hematological and Lymphatic ROS: negative for - bleeding problems, bruising or swollen lymph nodes Endocrine ROS: negative for - galactorrhea, hair pattern changes, polydipsia/polyuria or temperature intolerance Respiratory ROS: negative for - cough, hemoptysis, shortness of breath or wheezing Cardiovascular ROS: negative for - chest pain, dyspnea on exertion, edema or irregular heartbeat Gastrointestinal ROS: negative for - abdominal pain, diarrhea, hematemesis, nausea/vomiting or stool incontinence Genito-Urinary ROS: negative for - dysuria, hematuria, incontinence or urinary frequency/urgency Musculoskeletal ROS: negative for - joint swelling or muscular weakness Neurological ROS: as noted in HPI Dermatological ROS: negative for rash and skin lesion changes  Physical Examination: Blood pressure 131/73, pulse 82, temperature 98.6 F (37 C), temperature source Oral, resp. rate 18, height 6' (1.829 m), weight 98.5 kg, SpO2 97 %.  HEENT-  Normocephalic, no lesions, without obvious abnormality.  Normal external eye and conjunctiva.  Normal  TM's bilaterally.  Normal auditory canals and external ears. Normal external nose, mucus membranes and septum.  Normal pharynx. Cardiovascular- S1, S2 normal, pulses palpable throughout   Lungs- chest clear, no wheezing, rales, normal symmetric air entry Abdomen- soft, non-tender; bowel sounds normal; no masses,  no organomegaly Extremities- no edema Lymph-no adenopathy palpable Musculoskeletal-no joint tenderness, deformity or swelling Skin-warm and dry, no hyperpigmentation, vitiligo, or suspicious lesions  Neurological Examination   Mental Status: Alert, oriented, thought content appropriate.  Speech fluent without evidence of aphasia.  Able to follow 3 step commands without difficulty. Cranial Nerves: II: Discs flat bilaterally; Visual fields grossly normal, pupils equal, round, reactive to light and accommodation III,IV, VI: mild left ptosis, extra-ocular motions intact bilaterally V,VII: smile symmetric, facial light touch sensation normal bilaterally VIII: hearing normal bilaterally IX,X: gag reflex present XI: bilateral shoulder shrug XII: midline tongue extension Motor: Right : Upper extremity   5/5    Left:     Upper extremity   5/5  Lower extremity   5/5     Lower extremity   5/5 Tone and bulk:normal tone throughout; no atrophy noted Sensory: Pinprick and light  touch intact throughout, bilaterally Deep Tendon Reflexes: Symmetric throughout Plantars: Right: mute   Left: mute Cerebellar: Normal finger-to-nose and normal heel-to-shin testing bilaterally Gait: normal gait and station    Laboratory Studies:  Basic Metabolic Panel: Recent Labs  Lab 06/17/19 1939  NA 133*  K 4.5  CL 102  CO2 20*  GLUCOSE 143*  BUN 32*  CREATININE 1.50*  CALCIUM 9.3    Liver Function Tests: Recent Labs  Lab 06/17/19 1939  AST 47*  ALT 121*  ALKPHOS 132*  BILITOT 0.9  PROT 7.3  ALBUMIN 3.9   No results for input(s): LIPASE, AMYLASE in the last 168 hours. No results for  input(s): AMMONIA in the last 168 hours.  CBC: Recent Labs  Lab 06/17/19 1939  WBC 15.5*  NEUTROABS 12.4*  HGB 12.1*  HCT 36.3*  MCV 91.9  PLT 476*    Cardiac Enzymes: No results for input(s): CKTOTAL, CKMB, CKMBINDEX, TROPONINI in the last 168 hours.  BNP: Invalid input(s): POCBNP  CBG: No results for input(s): GLUCAP in the last 168 hours.  Microbiology: Results for orders placed or performed during the hospital encounter of 06/01/19  MRSA PCR Screening     Status: None   Collection Time: 06/01/19  5:30 PM   Specimen: Nasopharyngeal  Result Value Ref Range Status   MRSA by PCR NEGATIVE NEGATIVE Final    Comment:        The GeneXpert MRSA Assay (FDA approved for NASAL specimens only), is one component of a comprehensive MRSA colonization surveillance program. It is not intended to diagnose MRSA infection nor to guide or monitor treatment for MRSA infections. Performed at [facility], [place] [address], [place], [place] [address]   Surgical pcr screen     Status: None   Collection Time: 06/02/19  1:21 PM   Specimen: Nasal Mucosa; Nasal Swab  Result Value Ref Range Status   MRSA, PCR NEGATIVE NEGATIVE Final   Staphylococcus aureus NEGATIVE NEGATIVE Final    Comment: (NOTE) The Xpert SA Assay (FDA approved for NASAL specimens in patients 28 years of age and older), is one component of a comprehensive surveillance program. It is not intended to diagnose infection nor to guide or monitor treatment. Performed at [facility], [place] [address] [place], [place] [address]     Coagulation Studies: Recent Labs    06/17/19 1939  LABPROT 14.7  INR 1.2    Urinalysis: No results for input(s): COLORURINE, LABSPEC, PHURINE, GLUCOSEU, HGBUR, BILIRUBINUR, KETONESUR, PROTEINUR, UROBILINOGEN, NITRITE, LEUKOCYTESUR in the last 168 hours.  Invalid input(s): APPERANCEUR  Lipid Panel:    Component Value Date/Time   CHOL 100 06/18/2019 0508   CHOL 102  04/19/2019 0905   TRIG 86 06/18/2019 0508   TRIG 65 04/19/2019 0905   HDL 23 (L) 06/18/2019 0508   HDL 35 (L) 10/15/2018 1400   CHOLHDL 4.3 06/18/2019 0508   VLDL 17 06/18/2019 0508   VLDL 13 04/19/2019 0905   LDLCALC 60 06/18/2019 0508   LDLCALC 55 10/15/2018 1400    HgbA1C:  Lab Results  Component Value Date   HGBA1C 5.8 (H) 06/03/2019    Urine Drug Screen:  No results found for: LABOPIA, COCAINSCRNUR, LABBENZ, AMPHETMU, THCU, LABBARB  Alcohol Level: No results for input(s): ETH in the last 168 hours.  Other results: EKG: sinus rhythm at 83 bpm.  Imaging: Ct Head Wo Contrast  Result Date: 06/17/2019 CLINICAL DATA:  Episode of right facial droop this morning and again at  7 p.m. this evening. No known injury. EXAM: CT HEAD WITHOUT CONTRAST TECHNIQUE: Contiguous axial images were obtained from the base of the skull through the vertex without intravenous contrast. COMPARISON:  None. FINDINGS: Brain: No evidence of acute infarction, hemorrhage, hydrocephalus, extra-axial collection or mass lesion/mass effect. Vascular: Atherosclerosis noted. Skull: Normal. Negative for fracture or focal lesion. Sinuses/Orbits: Negative. Other: None. IMPRESSION: No acute abnormality. Atherosclerosis. Electronically Signed   By: Inge Rise M.D.   On: 06/17/2019 19:59   Mr Brain Wo Contrast  Result Date: 06/18/2019 CLINICAL DATA:  Initial evaluation for transient aphasia. EXAM: MRI HEAD WITHOUT CONTRAST TECHNIQUE: Multiplanar, multiecho pulse sequences of the brain and surrounding structures were obtained without intravenous contrast. COMPARISON:  Prior CT from 06/17/2019. FINDINGS: Brain: Cerebral volume within normal limits. Patchy T2/FLAIR hyperintensity within the supratentorial cerebral white matter, nonspecific, but most like related chronic small vessel ischemic disease, mild in nature. Few small patchy subcentimeter foci of restricted diffusion seen involving the ventromedial left thalamus,  consistent with acute to early subacute ischemic infarcts (series 5, images 27, 26, 24). No associated hemorrhage or mass effect. No other evidence for acute or subacute ischemia. Gray-white matter differentiation otherwise maintained. No evidence for remote cortical infarction. No foci of susceptibility artifact to suggest acute or chronic intracranial hemorrhage. No mass lesion, midline shift or mass effect. No hydrocephalus. No extra-axial fluid collection. Pituitary gland suprasellar region normal. Vascular: Major intracranial vascular flow voids are maintained. Skull and upper cervical spine: Craniocervical junction within normal limits. No focal marrow replacing lesion. Small lipoma noted at the scalp vertex. Sinuses/Orbits: Globes and orbital soft tissues within normal limits. Paranasal sinuses are clear. Trace right mastoid effusion, of doubtful significance. Negative nasopharynx. Other: None. IMPRESSION: 1. 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 No associated hemorrhage or mass effect. 2. No other acute intracranial abnormality. 3. Mild chronic microvascular ischemic disease. Electronically Signed   By: Jeannine Boga M.D.   On: 06/18/2019 02:16   US Carotid Bilateral (at [facility] Only)  Result Date: 06/18/2019 CLINICAL DATA:  68 year old male EXAM: BILATERAL CAROTID DUPLEX ULTRASOUND TECHNIQUE: Pearline Cables scale imaging, color Doppler and duplex ultrasound were performed of bilateral carotid and vertebral arteries in the neck. COMPARISON:  None. FINDINGS: Criteria: Quantification of carotid stenosis is based on velocity parameters that correlate the residual internal carotid diameter with NASCET-based stenosis levels, using the diameter of the distal internal carotid lumen as the denominator for stenosis measurement. The following velocity measurements were obtained: RIGHT ICA:  Systolic [id number]  cm/sec, Diastolic 28 cm/sec CCA:  81 cm/sec SYSTOLIC ICA/CCA RATIO:  1.3 ECA:  136 cm/sec LEFT ICA:  Systolic 123XX123 cm/sec, Diastolic 30 cm/sec CCA:  78 cm/sec SYSTOLIC ICA/CCA RATIO:  1.5 ECA:  106 cm/sec Right Brachial SBP: Not acquired Left Brachial SBP: Not acquired RIGHT CAROTID ARTERY: No significant calcifications of the right common carotid artery. Intermediate waveform maintained. Heterogeneous and partially calcified plaque at the right carotid bifurcation. No significant lumen shadowing. Low resistance waveform of the right ICA. No significant tortuosity. RIGHT VERTEBRAL ARTERY: Antegrade flow with low resistance waveform. LEFT CAROTID ARTERY: No significant calcifications of the left common carotid artery. Intermediate waveform maintained. Heterogeneous and partially calcified plaque at the left carotid bifurcation without significant lumen shadowing. Low resistance waveform of the left ICA. No significant tortuosity. LEFT VERTEBRAL ARTERY:  Antegrade flow with low resistance waveform. IMPRESSION: Color duplex indicates minimal heterogeneous and calcified plaque,  with no hemodynamically significant stenosis by duplex criteria in the extracranial cerebrovascular circulation. Signed, Dulcy Fanny. Dellia Nims, RPVI [facility] Electronically Signed   By: Corrie Mckusick D.O.   On: 06/18/2019 09:44    Assessment: 68 y.o. male presenting after 2 episodes of aphasia with some associated right facial droop and right arm weakness.  Symptoms now resolved.  Patient on ASA and Plavix.  MRI of the brian reviewed and shows small areas of acute infarction in left thalamus.  Carotid dopplers show on hemodynamically significant stenosis but concern is for the posterior circulation.  Echocardiogram pending.  LDL 60 on a statin.   Stroke Risk Factors - hyperlipidemia  Plan: 1. HgbA1c pending 2. CTA of the head and neck 3. PT consult, OT consult, Speech  consult 4. Echocardiogram pending.  Dr. Ubaldo Glassing follows patient.  Would involve him in the care of this patient as well.   5. Carotid dopplers 6. Prophylactic therapy-Continue ASA and Plavix.  Continue statin. 7. Telemetry monitoring 8. Frequent neuro checks   Alexis Goodell, MD Neurology [phone] 06/18/2019, 12:23 PM

## 2019-06-18 NOTE — H&P (Signed)
[facility] [place] NAME: Andre Gallegos    MR#:  [id number]  DATE OF BIRTH:  Jan 27, 1951  DATE OF ADMISSION:  06/17/2019  PRIMARY CARE PHYSICIAN: Guadalupe Maple, MD   REQUESTING/REFERRING PHYSICIAN: Ellender Hose, MD  CHIEF COMPLAINT:   Chief Complaint  Patient presents with  . Facial Droop    HISTORY OF PRESENT ILLNESS:  Andre Gallegos  is a 68 y.o. male who presents with chief complaint as above.  Patient presents the ED after 2 episodes of a aphasia lasting for about 30 to 40 minutes each.  The first was this morning, the second was this evening.  The episode this evening was also accompanied by some facial droop as reported by his wife, and the patient thinks perhaps some right upper extremity weakness.  All the symptoms have resolved at this time.  It was recommended by neurology that he be admitted tonight and have TIA/stroke work-up.  Hospitalist were called for admission  PAST MEDICAL HISTORY:   Past Medical History:  Diagnosis Date  . Arthritis   . CAD (coronary artery disease)   . Hyperlipidemia   . Myocardial infarction [facility]) 2005     PAST SURGICAL HISTORY:   Past Surgical History:  Procedure Laterality Date  . APPENDECTOMY    . CARDIAC CATHETERIZATION    . COLONOSCOPY WITH PROPOFOL N/A 11/01/2016   Procedure: COLONOSCOPY WITH PROPOFOL;  Surgeon: Jonathon Bellows, MD;  Location: [facility] ENDOSCOPY;  Service: Endoscopy;  Laterality: N/A;  . CORONARY ANGIOPLASTY    . CORONARY ARTERY BYPASS GRAFT N/A 06/05/2019   Procedure: CORONARY ARTERY BYPASS GRAFTING (CABG) x 3 WITH ENDOSCOPIC HARVESTING OF RIGHT GREATER SAPHENOUS VEIN. LIMA TO LAD.;  Surgeon: Ivin Poot, MD;  Location: [place];  Service: Open Heart Surgery;  Laterality: N/A;  . CORONARY/GRAFT ACUTE MI REVASCULARIZATION N/A 06/01/2019   Procedure: Coronary/Graft Acute MI Revascularization;  Surgeon: Yolonda Kida, MD;  Location: [place] [facility];  Service:  Cardiovascular;  Laterality: N/A;  . LEFT HEART CATH AND CORONARY ANGIOGRAPHY N/A 06/01/2019   Procedure: LEFT HEART CATH AND CORONARY ANGIOGRAPHY;  Surgeon: Yolonda Kida, MD;  Location: [place] [facility];  Service: Cardiovascular;  Laterality: N/A;  . TEE WITHOUT CARDIOVERSION N/A 06/05/2019   Procedure: TRANSESOPHAGEAL ECHOCARDIOGRAM (TEE);  Surgeon: Prescott Gum, Collier Salina, MD;  Location: [place];  Service: Open Heart Surgery;  Laterality: N/A;     SOCIAL HISTORY:   Social History   Tobacco Use  . Smoking status: Former Smoker    Types: Cigarettes    Quit date: 1990    Years since quitting: 30.7  . Smokeless tobacco: Former Systems developer    Types: Snuff  . Tobacco comment: quit 30 years ago   Substance Use Topics  . Alcohol use: Not Currently    Alcohol/week: 4.0 standard drinks    Types: 4 Cans of beer per week    Comment: occasionally      FAMILY HISTORY:   Family History  Problem Relation Age of Onset  . Lung cancer Father      DRUG ALLERGIES:  No Known Allergies  MEDICATIONS AT HOME:   Prior to Admission medications   Medication Sig Start Date End Date Taking? Authorizing Provider  amLODipine (NORVASC) 10 MG tablet Take 1 tablet (10 mg total) by mouth daily. 10/15/18  Yes Crissman, Jeannette How, MD  aspirin (ASPIRIN EC) 81 MG EC tablet Take 81 mg by mouth daily. Swallow whole.   Yes  [provider]  atorvastatin (LIPITOR) 80 MG tablet Take 1 tablet (80 mg total) by mouth daily at 6 PM. 06/11/19  Yes Gold, Patrick Jupiter E, PA-C  carvedilol (COREG) 12.5 MG tablet Take 1 tablet (12.5 mg total) by mouth 2 (two) times daily with a meal. 06/11/19  Yes Gold, Wayne E, PA-C  clopidogrel (PLAVIX) 75 MG tablet Take 1 tablet (75 mg total) by mouth daily. 06/11/19  Yes Gold, Wayne E, PA-C  ezetimibe (ZETIA) 10 MG tablet Take 1 tablet (10 mg total) by mouth daily. Patient taking differently: Take 10 mg by mouth every evening.  10/15/18  Yes Guadalupe Maple, MD  ferrous sulfate 325 (65 FE) MG  tablet Take 1 tablet (325 mg total) by mouth daily with breakfast. 06/12/19  Yes Gold, Wayne E, PA-C  FIBER PO Take 2 tablets by mouth 2 (two) times daily.   Yes [provider]  folic acid (FOLVITE) 1 MG tablet Take 1 tablet (1 mg total) by mouth daily. 06/11/19  Yes Gold, Wayne E, PA-C  Lactobacillus (PROBIOTIC ACIDOPHILUS PO) Take 1 capsule by mouth at bedtime.    Yes [provider]  oxyCODONE (OXY IR/ROXICODONE) 5 MG immediate release tablet Take 1-2 tablets (5-10 mg total) by mouth every 6 (six) hours as needed for up to 7 days for severe pain. 06/11/19 06/18/19 Yes Gold, Wilder Glade, PA-C    REVIEW OF SYSTEMS:  Review of Systems  Constitutional: Negative for chills, fever, malaise/fatigue and weight loss.  HENT: Negative for ear pain, hearing loss and tinnitus.   Eyes: Negative for blurred vision, double vision, pain and redness.  Respiratory: Negative for cough, hemoptysis and shortness of breath.   Cardiovascular: Negative for chest pain, palpitations, orthopnea and leg swelling.  Gastrointestinal: Negative for abdominal pain, constipation, diarrhea, nausea and vomiting.  Genitourinary: Negative for dysuria, frequency and hematuria.  Musculoskeletal: Negative for back pain, joint pain and neck pain.  Skin:       No acne, rash, or lesions  Neurological: Positive for sensory change, speech change and focal weakness. Negative for dizziness, tremors and weakness.  Endo/Heme/Allergies: Negative for polydipsia. Does not bruise/bleed easily.  Psychiatric/Behavioral: Negative for depression. The patient is not nervous/anxious and does not have insomnia.      VITAL SIGNS:   Vitals:   06/17/19 1932 06/17/19 2133 06/17/19 2200 06/17/19 2300  BP: 133/79 138/70 130/72 123/67  Pulse: (!) 102 85 86 84  Resp: 20 14 13 14   Temp: 98.3 F (36.8 C)     TempSrc: Oral     SpO2: 97% 97% 96% 96%   Wt Readings from Last 3 Encounters:  06/11/19 104.6 kg  06/01/19 106.6 kg  10/31/18  108.7 kg    PHYSICAL EXAMINATION:  Physical Exam  Vitals reviewed. Constitutional: He is oriented to person, place, and time. He appears well-developed and well-nourished. No distress.  HENT:  Head: Normocephalic and atraumatic.  Mouth/Throat: Oropharynx is clear and moist.  Eyes: Pupils are equal, round, and reactive to light. Conjunctivae and EOM are normal. No scleral icterus.  Neck: Normal range of motion. Neck supple. No JVD present. No thyromegaly present.  Cardiovascular: Normal rate, regular rhythm and intact distal pulses. Exam reveals no gallop and no friction rub.  No murmur heard. Respiratory: Effort normal and breath sounds normal. No respiratory distress. He has no wheezes. He has no rales.  GI: Soft. Bowel sounds are normal. He exhibits no distension. There is no abdominal tenderness.  Musculoskeletal: Normal range of motion.  General: No edema.     Comments: No arthritis, no gout  Lymphadenopathy:    He has no cervical adenopathy.  Neurological: He is alert and oriented to person, place, and time. No cranial nerve deficit.  Neurologic: Cranial nerves II-XII intact, Sensation intact to light touch/pinprick, 5/5 strength in all extremities, no dysarthria, no aphasia, no dysphagia, memory intact, finger to nose testing showed no abnormality, no pronator drift  Skin: Skin is warm and dry. No rash noted. No erythema.  Psychiatric: He has a normal mood and affect. His behavior is normal. Judgment and thought content normal.    LABORATORY PANEL:   CBC Recent Labs  Lab 06/17/19 1939  WBC 15.5*  HGB 12.1*  HCT 36.3*  PLT 476*   ------------------------------------------------------------------------------------------------------------------  Chemistries  Recent Labs  Lab 06/17/19 1939  NA 133*  K 4.5  CL 102  CO2 20*  GLUCOSE 143*  BUN 32*  CREATININE 1.50*  CALCIUM 9.3  AST 47*  ALT 121*  ALKPHOS 132*  BILITOT 0.9    ------------------------------------------------------------------------------------------------------------------  Cardiac Enzymes No results for input(s): TROPONINI in the last 168 hours. ------------------------------------------------------------------------------------------------------------------  RADIOLOGY:  Ct Head Wo Contrast  Result Date: 06/17/2019 CLINICAL DATA:  Episode of right facial droop this morning and again at 7 p.m. this evening. No known injury. EXAM: CT HEAD WITHOUT CONTRAST TECHNIQUE: Contiguous axial images were obtained from the base of the skull through the vertex without intravenous contrast. COMPARISON:  None. FINDINGS: Brain: No evidence of acute infarction, hemorrhage, hydrocephalus, extra-axial collection or mass lesion/mass effect. Vascular: Atherosclerosis noted. Skull: Normal. Negative for fracture or focal lesion. Sinuses/Orbits: Negative. Other: None. IMPRESSION: No acute abnormality. Atherosclerosis. Electronically Signed   By: Inge Rise M.D.   On: 06/17/2019 19:59    EKG:   Orders placed or performed during the hospital encounter of 06/17/19  . EKG 12-Lead  . EKG 12-Lead  . ED EKG  . ED EKG    IMPRESSION AND PLAN:  Principal Problem:   TIA (transient ischemic attack) -will admit per stroke/TIA admission order set with appropriate imaging, labs, consults including neurology consult. Active Problems:   Coronary artery disease -continue home meds   Benign essential hypertension -permissive hypertension for now, blood pressure goal less than 220/120   Hyperlipemia, mixed -home dose antilipid  Chart review performed and case discussed with ED provider. Labs, imaging and/or ECG reviewed by provider and discussed with patient/family. Management plans discussed with the patient and/or family.  COVID-19 status: Pending  DVT PROPHYLAXIS: SubQ lovenox   GI PROPHYLAXIS:  None  ADMISSION STATUS: Observation  CODE STATUS: Full Code Status  History    Date Active Date Inactive Code Status Order ID Comments User Context   06/01/2019 1924 06/11/2019 1417 Full Code [id number]  Yolonda Kida, MD Inpatient   06/01/2019 1758 06/01/2019 1924 Full Code [id number]  Belva Crome, MD Inpatient   Advance Care Planning Activity    Advance Directive Documentation     Most Recent Value  Type of Advance Directive  Living will  Pre-existing out of facility DNR order (yellow form or pink MOST form)  -  "MOST" Form in Place?  -      TOTAL TIME TAKING CARE OF THIS PATIENT: 40 minutes.   This patient was evaluated in the context of the global COVID-19 pandemic, which necessitated consideration that the patient might be at risk for infection with the SARS-CoV-2 virus that causes COVID-19. Institutional protocols and algorithms that pertain to the evaluation of  patients at risk for COVID-19 are in a state of rapid change based on information released by regulatory bodies including the CDC and federal and state organizations. These policies and algorithms were followed to the best of this provider's knowledge to date during the patient's care at this facility.  Ethlyn Daniels 06/18/2019, 12:04 AM  [facility]  Office  [phone]  CC: Primary care physician; Guadalupe Maple, MD  Note:  This document was prepared using Dragon voice recognition software and may include unintentional dictation errors.

## 2019-06-18 NOTE — ED Notes (Signed)
Patient transported to MRI 

## 2019-06-18 NOTE — Care Management Obs Status (Signed)
[place] NOTIFICATION   Patient Details  Name: Andre Gallegos MRN: [id number] Date of Birth: 1951/03/07   Medicare Observation Status Notification Given:  Other (see comment)(given to spouse due to patient being in a procedure)    Tommy Medal 06/18/2019, 2:08 PM

## 2019-06-18 NOTE — Evaluation (Signed)
Occupational Therapy Evaluation Patient Details Name: Andre Gallegos MRN: [id number] DOB: Dec 29, 1950 Today's Date: 06/18/2019    History of Present Illness Pt. is 68 y.o. male who presented to the ER after 2 episodes of a aphasia, and facial droop. Pt. is 2 weeks s/p open-heart surgery.   Clinical Impression   Pt. presents with limited BUE functional reach secondary to post surgical limitations from following open heart surgery 2 weeks ago which limits his ability to complete UE ADLs. Pt. resides at home with his wife. Pt. was independent with ADLs, and IADL functioning: including meal preparation, and medication management. Pt. was able to drive. Pt. Requires minA to wash his back, and donn a shirt. Pt. education was provided about long handled A/E to assist with UE ADLs. Pt. reports that his wife has been helping to provide the minimal assist needed for UE ADLs.after the surgery, prior to this most recent hospitalization. Pt. Reports planning to continue to have his wife assist as needed upon discharge. Pt. reports that his symptoms have resolved. No further OT services are warranted at this time. Pt. is in agreement. Will complete the order, and sign off.    Follow Up Recommendations  No OT follow up    Equipment Recommendations  None recommended by OT    Recommendations for Other Services       Precautions / Restrictions Precautions Precautions: None Restrictions Weight Bearing Restrictions: No      Mobility Bed Mobility Overal bed mobility: Independent             General bed mobility comments:   Transfers Overall transfer level: Independent Equipment used: None             General transfer comment: Independent with transfers to the recliner    Balance                                           ADL either performed or assessed with clinical judgement   ADL Overall ADL's : Independent;Needs assistance/impaired Eating/Feeding:  Independent   Grooming: Independent   Upper Body Bathing: Minimal assistance Upper Body Bathing Details (indicate cue type and reason): Assist to reach, and wash his back back secondary to post surgical UE limitation Lower Body Bathing: Independent   Upper Body Dressing : Minimal assistance   Lower Body Dressing: Independent   Toilet Transfer: Independent   Toileting- Clothing Manipulation and Hygiene: Independent       Functional mobility during ADLs: Independent       Vision Baseline Vision/History: Wears glasses Wears Glasses: At all times Patient Visual Report: No change from baseline       Perception     Praxis      Pertinent Vitals/Pain Pain Assessment: No/denies pain     Hand Dominance     Extremity/Trunk Assessment Upper Extremity Assessment Upper Extremity Assessment: Overall WFL for tasks assessed(Post surgical limitations for UE reaching)   Lower Extremity Assessment Lower Extremity Assessment: Overall WFL for tasks assessed(equal bilaterally, )       Communication Communication Communication: No difficulties   Cognition Arousal/Alertness: Awake/alert Behavior During Therapy: WFL for tasks assessed/performed Overall Cognitive Status: Within Functional Limits for tasks assessed  General Comments       Exercises     Shoulder Instructions      Home Living Family/patient expects to be discharged to:: Private residence Living Arrangements: Spouse/significant other Available Help at Discharge: Family Type of Home: Apartment Home Access: Stairs to enter Technical brewer of Steps: 3 Entrance Stairs-Rails: Right Home Layout: Laundry or work area in basement;Able to live on main level with bedroom/bathroom     Bathroom Shower/Tub: Tub/shower unit;Curtain         Home Equipment: Radio producer - single point          Prior Functioning/Environment Level of Independence: Independent                  OT Problem List: Decreased strength      OT Treatment/Interventions:      OT Goals(Current goals can be found in the care plan section) Acute Rehab OT Goals Patient Stated Goal: To return home OT Goal Formulation: With patient Potential to Achieve Goals: Good  OT Frequency:     Barriers to D/C:            Co-evaluation              AM-PAC OT "6 Clicks" Daily Activity     Outcome Measure Help from another person eating meals?: None Help from another person taking care of personal grooming?: None Help from another person toileting, which includes using toliet, bedpan, or urinal?: None Help from another person bathing (including washing, rinsing, drying)?: A Little Help from another person to put on and taking off regular upper body clothing?: A Little Help from another person to put on and taking off regular lower body clothing?: None 6 Click Score: 22   End of Session    Activity Tolerance: Patient tolerated treatment well Patient left: in chair;with call bell/phone within reach  OT Visit Diagnosis: Muscle weakness (generalized) (M62.81)                Time: [id number] OT Time Calculation (min): 16 min Charges:  OT General Charges $OT Visit: 1 Visit OT Evaluation $OT Eval Low Complexity: 1 Low   Harrel Carina, MS, OTR/L  Harrel Carina 06/18/2019, 10:04 AM

## 2019-06-18 NOTE — Plan of Care (Signed)
  Problem: Activity: Goal: Risk for activity intolerance will decrease Outcome: Progressing   Problem: Nutrition: Goal: Adequate nutrition will be maintained Outcome: Progressing   Problem: Coping: Goal: Level of anxiety will decrease Outcome: Progressing   Problem: Pain Managment: Goal: General experience of comfort will improve Outcome: Progressing   Problem: Safety: Goal: Ability to remain free from injury will improve Outcome: Progressing   Problem: Education: Goal: Knowledge of disease or condition will improve Outcome: Progressing Goal: Knowledge of secondary prevention will improve Outcome: Progressing Goal: Knowledge of patient specific risk factors addressed and post discharge goals established will improve Outcome: Progressing   Problem: Coping: Goal: Will verbalize positive feelings about self Outcome: Progressing   Problem: Self-Care: Goal: Ability to participate in self-care as condition permits will improve Outcome: Progressing Goal: Verbalization of feelings and concerns over difficulty with self-care will improve Outcome: Progressing Goal: Ability to communicate needs accurately will improve Outcome: Progressing   Problem: Nutrition: Goal: Dietary intake will improve Outcome: Progressing

## 2019-06-18 NOTE — Progress Notes (Signed)
*  PRELIMINARY RESULTS* Echocardiogram 2D Echocardiogram has been performed.  Andre Gallegos 06/18/2019, 12:23 PM

## 2019-06-18 NOTE — Plan of Care (Signed)

## 2019-06-19 DIAGNOSIS — R29810 Facial weakness: Secondary | ICD-10-CM | POA: Diagnosis present

## 2019-06-19 DIAGNOSIS — Z801 Family history of malignant neoplasm of trachea, bronchus and lung: Secondary | ICD-10-CM | POA: Diagnosis not present

## 2019-06-19 DIAGNOSIS — I639 Cerebral infarction, unspecified: Secondary | ICD-10-CM | POA: Diagnosis present

## 2019-06-19 DIAGNOSIS — Z87891 Personal history of nicotine dependence: Secondary | ICD-10-CM | POA: Diagnosis not present

## 2019-06-19 DIAGNOSIS — I313 Pericardial effusion (noninflammatory): Secondary | ICD-10-CM | POA: Diagnosis present

## 2019-06-19 DIAGNOSIS — Z7982 Long term (current) use of aspirin: Secondary | ICD-10-CM | POA: Diagnosis not present

## 2019-06-19 DIAGNOSIS — R4701 Aphasia: Secondary | ICD-10-CM | POA: Diagnosis present

## 2019-06-19 DIAGNOSIS — Z9861 Coronary angioplasty status: Secondary | ICD-10-CM | POA: Diagnosis not present

## 2019-06-19 DIAGNOSIS — Z7902 Long term (current) use of antithrombotics/antiplatelets: Secondary | ICD-10-CM | POA: Diagnosis not present

## 2019-06-19 DIAGNOSIS — N183 Chronic kidney disease, stage 3 (moderate): Secondary | ICD-10-CM | POA: Diagnosis present

## 2019-06-19 DIAGNOSIS — R29700 NIHSS score 0: Secondary | ICD-10-CM | POA: Diagnosis present

## 2019-06-19 DIAGNOSIS — G459 Transient cerebral ischemic attack, unspecified: Secondary | ICD-10-CM | POA: Diagnosis present

## 2019-06-19 DIAGNOSIS — R531 Weakness: Secondary | ICD-10-CM | POA: Diagnosis present

## 2019-06-19 DIAGNOSIS — E782 Mixed hyperlipidemia: Secondary | ICD-10-CM | POA: Diagnosis present

## 2019-06-19 DIAGNOSIS — I712 Thoracic aortic aneurysm, without rupture: Secondary | ICD-10-CM | POA: Diagnosis present

## 2019-06-19 DIAGNOSIS — Z951 Presence of aortocoronary bypass graft: Secondary | ICD-10-CM | POA: Diagnosis not present

## 2019-06-19 DIAGNOSIS — I129 Hypertensive chronic kidney disease with stage 1 through stage 4 chronic kidney disease, or unspecified chronic kidney disease: Secondary | ICD-10-CM | POA: Diagnosis present

## 2019-06-19 DIAGNOSIS — Z8673 Personal history of transient ischemic attack (TIA), and cerebral infarction without residual deficits: Secondary | ICD-10-CM | POA: Diagnosis present

## 2019-06-19 DIAGNOSIS — I252 Old myocardial infarction: Secondary | ICD-10-CM | POA: Diagnosis not present

## 2019-06-19 DIAGNOSIS — D638 Anemia in other chronic diseases classified elsewhere: Secondary | ICD-10-CM | POA: Diagnosis present

## 2019-06-19 DIAGNOSIS — Z20828 Contact with and (suspected) exposure to other viral communicable diseases: Secondary | ICD-10-CM | POA: Diagnosis present

## 2019-06-19 DIAGNOSIS — I2510 Atherosclerotic heart disease of native coronary artery without angina pectoris: Secondary | ICD-10-CM | POA: Diagnosis present

## 2019-06-19 LAB — BASIC METABOLIC PANEL
Anion gap: 11 (ref 5–15)
BUN: 24 mg/dL — ABNORMAL HIGH (ref 8–23)
CO2: 21 mmol/L — ABNORMAL LOW (ref 22–32)
Calcium: 9.4 mg/dL (ref 8.9–10.3)
Chloride: 103 mmol/L (ref 98–111)
Creatinine, Ser: 1.33 mg/dL — ABNORMAL HIGH (ref 0.61–1.24)
GFR calc Af Amer: >60 mL/min (ref >60)
GFR calc non Af Amer: 55 mL/min — ABNORMAL LOW (ref >60)
Glucose, Bld: 140 mg/dL — ABNORMAL HIGH (ref 70–99)
Potassium: 4.5 mmol/L (ref 3.5–5.1)
Sodium: 135 mmol/L (ref 135–145)

## 2019-06-19 LAB — HEMOGLOBIN A1C
Hgb A1c MFr Bld: 5.5 % (ref 4.8–5.6)
Mean Plasma Glucose: 111 mg/dL

## 2019-06-19 LAB — HIV ANTIBODY (ROUTINE TESTING W REFLEX): HIV Screen 4th Generation wRfx: NONREACTIVE

## 2019-06-19 NOTE — Plan of Care (Signed)

## 2019-06-19 NOTE — Discharge Summary (Signed)
[facility] [place] NAME: Andre Gallegos    MR#:  [id number]  DATE OF BIRTH:  1951/03/22  DATE OF ADMISSION:  06/17/2019   ADMITTING PHYSICIAN: Lance Coon, MD  DATE OF DISCHARGE: 06/19/2019 12:10 PM  PRIMARY CARE PHYSICIAN: Guadalupe Maple, MD   ADMISSION DIAGNOSIS:   TIA (transient ischemic attack) [G45.9]  DISCHARGE DIAGNOSIS:   Principal Problem:   TIA (transient ischemic attack) Active Problems:   Coronary artery disease   Benign essential hypertension   Hyperlipemia, mixed   Stroke (cerebrum) ([place])   SECONDARY DIAGNOSIS:   Past Medical History:  Diagnosis Date   Arthritis    CAD (coronary artery disease)    Hyperlipidemia    Myocardial infarction [facility]) 2005    HOSPITAL COURSE:   68 y/o M with PMH significant for CAD s/p CABG 2 weeks ago, HTN, hyperlipidemia, arthritis, anemia, CKD admitted for transient change in his speech yesterday.,  1. Acute stroke- no residual symptoms now - MRI with tiny left thalamic infarcts. Carotid dopplers with no hemodynamically significant stenosis. -CT angiogram of the head and neck however showed bilateral internal carotid artery plaque, plaque at the origin of left vertebral artery with moderate ostial stenosis and moderate to severe stenosis at V2 level of left vertebral artery.  There is a neck tactic ascending thoracic aortic aneurysm at 3.9 cm in diameter.  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 -Patient already on aspirin, Plavix and statin which can be continued. -He will need repeat CT imaging studies done 6 to 12 months later as outpatient to monitor the aneurysms. -He does not have any residual symptoms at this time.  Echocardiogram with normal EF, has mild to moderate pericardial effusion. - appreciate neuro input - PT/OT and speech consults appreciated- no needs  identified  2. Hypertension- norvasc, coreg,   3. CKD stage3- stable  4. Anemia of chronic disease- on iron supplements  5. CAD- h/o prior stents, recent CABG 2 weeks ago - no symptoms. Continue all current cardiac medications   Patient is up and ambulatory.  Will be discharged home today    DISCHARGE CONDITIONS:   Guarded  CONSULTS OBTAINED:   Treatment Team:  Catarina Hartshorn, MD  DRUG ALLERGIES:   No Known Allergies DISCHARGE MEDICATIONS:   Allergies as of 06/19/2019   No Known Allergies     Medication List    TAKE these medications   amLODipine 10 MG tablet Commonly known as: NORVASC Take 1 tablet (10 mg total) by mouth daily.   aspirin EC 81 MG EC tablet Generic drug: aspirin Take 81 mg by mouth daily. Swallow whole.   atorvastatin 80 MG tablet Commonly known as: LIPITOR Take 1 tablet (80 mg total) by mouth daily at 6 PM.   carvedilol 12.5 MG tablet Commonly known as: COREG Take 1 tablet (12.5 mg total) by mouth 2 (two) times daily with a meal.   clopidogrel 75 MG tablet Commonly known as: PLAVIX Take 1 tablet (75 mg total) by mouth daily.   ezetimibe 10 MG tablet Commonly known as: ZETIA Take 1 tablet (10 mg total) by mouth daily. What changed: when to take this   ferrous sulfate 325 (65 FE) MG tablet Take 1 tablet (325 mg total) by mouth daily with breakfast.   FIBER PO Take 2 tablets by mouth 2 (two) times daily.   folic acid  1 MG tablet Commonly known as: FOLVITE Take 1 tablet (1 mg total) by mouth daily.   PROBIOTIC ACIDOPHILUS PO Take 1 capsule by mouth at bedtime.     ASK your doctor about these medications   oxyCODONE 5 MG immediate release tablet Commonly known as: Oxy IR/ROXICODONE Take 1-2 tablets (5-10 mg total) by mouth every 6 (six) hours as needed for up to 7 days for severe pain. Ask about: Should I take this medication?        DISCHARGE INSTRUCTIONS:   1.  PCP follow-up in 1 to 2 weeks 2.  Cardiology  follow-up in 2 weeks 3.  Neurology follow-up in 2 weeks  DIET:   Cardiac diet  ACTIVITY:   Activity as tolerated  OXYGEN:   Home Oxygen: No.  Oxygen Delivery: room air  DISCHARGE LOCATION:   home   If you experience worsening of your admission symptoms, develop shortness of breath, life threatening emergency, suicidal or homicidal thoughts you must seek medical attention immediately by calling 911 or calling your MD immediately  if symptoms less severe.  You Must read complete instructions/literature along with all the possible adverse reactions/side effects for all the Medicines you take and that have been prescribed to you. Take any new Medicines after you have completely understood and accpet all the possible adverse reactions/side effects.   Please note  You were cared for by a hospitalist during your hospital stay. If you have any questions about your discharge medications or the care you received while you were in the hospital after you are discharged, you can call the unit and asked to speak with the hospitalist on call if the hospitalist that took care of you is not available. Once you are discharged, your primary care physician will handle any further medical issues. Please note that NO REFILLS for any discharge medications will be authorized once you are discharged, as it is imperative that you return to your primary care physician (or establish a relationship with a primary care physician if you do not have one) for your aftercare needs so that they can reassess your need for medications and monitor your lab values.    On the day of Discharge:  VITAL SIGNS:   Blood pressure 134/80, pulse 100, temperature 98.1 F (36.7 C), temperature source Oral, resp. rate 18, height 6' (1.829 m), weight 97.5 kg, SpO2 100 %.  PHYSICAL EXAMINATION:    GENERAL:  68 y.o.-year-old patient lying in the bed with no acute distress.  EYES: Pupils equal, round, reactive to light and  accommodation. No scleral icterus. Extraocular muscles intact.  HEENT: Head atraumatic, normocephalic. Oropharynx and nasopharynx clear.  NECK:  Supple, no jugular venous distention. No thyroid enlargement, no tenderness.  LUNGS: Normal breath sounds bilaterally, no wheezing, rales,rhonchi or crepitation. No use of accessory muscles of respiration.  CARDIOVASCULAR: S1, S2 normal.  Nicely healing CABG scar noted.  No murmurs, rubs, or gallops.  ABDOMEN: Soft, nontender, nondistended. Bowel sounds present. No organomegaly or mass.  EXTREMITIES: No pedal edema, cyanosis, or clubbing.  NEUROLOGIC: Cranial nerves II through XII are intact. Muscle strength 5/5 in all extremities. Sensation intact. Gait not checked.  PSYCHIATRIC: The patient is alert and oriented x 3.  SKIN: No obvious rash, lesion, or ulcer.    DATA REVIEW:   CBC Recent Labs  Lab 06/17/19 1939  WBC 15.5*  HGB 12.1*  HCT 36.3*  PLT 476*    Chemistries  Recent Labs  Lab 06/17/19 1939 06/19/19 0855  NA 133* 135  K 4.5 4.5  CL 102 103  CO2 20* 21*  GLUCOSE 143* 140*  BUN 32* 24*  CREATININE 1.50* 1.33*  CALCIUM 9.3 9.4  AST 47*  --   ALT 121*  --   ALKPHOS 132*  --   BILITOT 0.9  --      Microbiology Results  Results for orders placed or performed during the hospital encounter of 06/17/19  SARS CORONAVIRUS 2 (TAT 6-24 HRS) Nasopharyngeal Nasopharyngeal Swab     Status: None   Collection Time: 06/18/19 12:08 AM   Specimen: Nasopharyngeal Swab  Result Value Ref Range Status   SARS Coronavirus 2 NEGATIVE NEGATIVE Final    Comment: (NOTE) SARS-CoV-2 target nucleic acids are NOT DETECTED. The SARS-CoV-2 RNA is generally detectable in upper and lower respiratory specimens during the acute phase of infection. Negative results do not preclude SARS-CoV-2 infection, do not rule out co-infections with other pathogens, and should not be used as the sole basis for treatment or other patient management  decisions. Negative results must be combined with clinical observations, patient history, and epidemiological information. The expected result is Negative. Fact Sheet for Patients: SugarRoll.be Fact Sheet for Healthcare Providers: https://www.woods-mathews.com/ This test is not yet approved or cleared by the [place] FDA and  has been authorized for detection and/or diagnosis of SARS-CoV-2 by FDA under an Emergency Use Authorization (EUA). This EUA will remain  in effect (meaning this test can be used) for the duration of the COVID-19 declaration under Section 56 4(b)(1) of the Act, 21 U.S.C. section 360bbb-3(b)(1), unless the authorization is terminated or revoked sooner. Performed at [facility], [place] [address], [place], [place] [address]     RADIOLOGY:  Ct Angio Head W Or Wo Contrast  Result Date: 06/18/2019 CLINICAL DATA:  Stroke, follow-up. EXAM: CT ANGIOGRAPHY HEAD AND NECK TECHNIQUE: Multidetector CT imaging of the head and neck was performed using the standard protocol during bolus administration of intravenous contrast. Multiplanar CT image reconstructions and MIPs were obtained to evaluate the vascular anatomy. Carotid stenosis measurements (when applicable) are obtained utilizing NASCET criteria, using the distal internal carotid diameter as the denominator. CONTRAST:  14mL OMNIPAQUE IOHEXOL 350 MG/ML SOLN COMPARISON:  Carotid artery duplex 06/18/2019, brain MRI 06/18/2019, head CT 06/17/2019 FINDINGS: CTA NECK FINDINGS Aortic arch: Sequela of prior median sternotomy. Ectatic ascending thoracic aorta measuring 3.8 cm in diameter. Common origin of the innominate and left common carotid arteries. Atherosclerotic plaque within the visualized aortic arch and major branch vessel origins. Right carotid system: CCA and ICA patent within the neck without significant stenosis (50% or greater). Calcified plaque at the carotid bifurcation  and within the proximal ICA results in less than 50% narrowing of the proximal ICA. Left carotid system: CCA and ICA patent within the neck without significant stenosis (50% or greater). Calcified plaque within the carotid bifurcation and proximal ICA without significant stenosis. Vertebral arteries: Scattered plaque within the V1 right vertebral artery with regions of mild-to-moderate narrowing. Distal to this, the right vertebral artery is patent within the neck without significant stenosis (50% or greater). Plaque at the origin of the left vertebral artery with mild/moderate ostial narrowing. Moderate/severe focal narrowing of the V2 left vertebral artery at the C5 level due to mass effect from facet arthrosis/osteophyte formation (series 4, image 83). Distal to this, the left vertebral artery is patent within the neck without significant stenosis (50% or greater). The vertebral arteries are codominant. Skeleton: Cervical spondylosis.  No acute  bony abnormality. Other neck: No pathologically enlarged cervical chain lymph nodes. Fat density prominence within the lower left ventral neck measuring 3.6 x 1.8 x 4.7 cm, likely reflecting a lipoma. Upper chest: No confluent consolidation within the imaged lungs. Review of the MIP images confirms the above findings CTA HEAD FINDINGS Anterior circulation: Calcified plaque within the bilateral intracranial carotid artery siphons. Resultant mild to moderate stenoses within the cavernous/paraclinoid internal carotid arteries bilaterally. Right middle and anterior cerebral arteries patent without high-grade proximal stenosis. Left middle and anterior cerebral arteries patent without high-grade proximal stenosis. 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 Posterior circulation: Prominent calcified and noncalcified atherosclerotic plaque within the intracranial right vertebral  artery with severe focal stenosis (series 6, image 274). Scattered calcified plaque within the intracranial left vertebral artery without significant stenosis. Basilar artery is patent without high-grade stenosis. Bilateral posterior cerebral arteries patent without high-grade proximal stenosis. Venous sinuses: Within limitations of contrast timing, no convincing thrombus. Anatomic variants: None significant Review of the MIP images confirms the above findings IMPRESSION: CTA head: 1. Intracranial atherosclerotic disease as detailed. 2. Severe focal stenosis within the intracranial right vertebral artery. 3. Regions of mild-to-moderate stenosis within the bilateral cavernous/paraclinoid internal carotid arteries 4. 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 CTA neck: 1. Atherosclerotic disease within the visualized aortic arch and major branch vessels of the neck as detailed. 2. The bilateral common and cervical internal carotid arteries are patent without significant stenosis (50% or greater). Plaque at the right bifurcation results in less than 50% narrowing of the proximal right ICA. 3. Plaque within the V1 right vertebral artery with regions of mild-to-moderate stenosis. 4. Plaque at the origin of the left vertebral artery with mild/moderate ostial stenosis. Additionally, there is moderate/severe focal narrowing of the V2 left vertebral artery at the C5 level due to mass effect from degenerative facet hypertrophy/osteophyte formation. 5. Ectatic ascending thoracic aorta measuring 3.9 cm in diameter. 6. Lower neck lipoma. Electronically Signed   By: Kellie Simmering   On: 06/18/2019 15:08   Ct Angio Neck W Or Wo Contrast  Result Date: 06/18/2019 CLINICAL DATA:  Stroke, follow-up. EXAM: CT ANGIOGRAPHY HEAD AND NECK TECHNIQUE: Multidetector CT imaging of the head and neck was performed using the standard protocol during bolus administration of intravenous contrast. Multiplanar CT  image reconstructions and MIPs were obtained to evaluate the vascular anatomy. Carotid stenosis measurements (when applicable) are obtained utilizing NASCET criteria, using the distal internal carotid diameter as the denominator. CONTRAST:  59mL OMNIPAQUE IOHEXOL 350 MG/ML SOLN COMPARISON:  Carotid artery duplex 06/18/2019, brain MRI 06/18/2019, head CT 06/17/2019 FINDINGS: CTA NECK FINDINGS Aortic arch: Sequela of prior median sternotomy. Ectatic ascending thoracic aorta measuring 3.8 cm in diameter. Common origin of the innominate and left common carotid arteries. Atherosclerotic plaque within the visualized aortic arch and major branch vessel origins. Right carotid system: CCA and ICA patent within the neck without significant stenosis (50% or greater). Calcified plaque at the carotid bifurcation and within the proximal ICA results in less than 50% narrowing of the proximal ICA. Left carotid system: CCA and ICA patent within the neck without significant stenosis (50% or greater). Calcified plaque within the carotid bifurcation and proximal ICA without significant stenosis. Vertebral arteries: Scattered plaque within the V1 right vertebral artery with regions of mild-to-moderate narrowing. Distal to this, the right vertebral artery is patent within the neck without significant  stenosis (50% or greater). Plaque at the origin of the left vertebral artery with mild/moderate ostial narrowing. Moderate/severe focal narrowing of the V2 left vertebral artery at the C5 level due to mass effect from facet arthrosis/osteophyte formation (series 4, image 83). Distal to this, the left vertebral artery is patent within the neck without significant stenosis (50% or greater). The vertebral arteries are codominant. Skeleton: Cervical spondylosis.  No acute bony abnormality. Other neck: No pathologically enlarged cervical chain lymph nodes. Fat density prominence within the lower left ventral neck measuring 3.6 x 1.8 x 4.7 cm,  likely reflecting a lipoma. Upper chest: No confluent consolidation within the imaged lungs. Review of the MIP images confirms the above findings CTA HEAD FINDINGS Anterior circulation: Calcified plaque within the bilateral intracranial carotid artery siphons. Resultant mild to moderate stenoses within the cavernous/paraclinoid internal carotid arteries bilaterally. Right middle and anterior cerebral arteries patent without high-grade proximal stenosis. Left middle and anterior cerebral arteries patent without high-grade proximal stenosis. 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 Posterior circulation: Prominent calcified and noncalcified atherosclerotic plaque within the intracranial right vertebral artery with severe focal stenosis (series 6, image 274). Scattered calcified plaque within the intracranial left vertebral artery without significant stenosis. Basilar artery is patent without high-grade stenosis. Bilateral posterior cerebral arteries patent without high-grade proximal stenosis. Venous sinuses: Within limitations of contrast timing, no convincing thrombus. Anatomic variants: None significant Review of the MIP images confirms the above findings IMPRESSION: CTA head: 1. Intracranial atherosclerotic disease as detailed. 2. Severe focal stenosis within the intracranial right vertebral artery. 3. Regions of mild-to-moderate stenosis within the bilateral cavernous/paraclinoid internal carotid arteries 4. 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 CTA neck: 1. Atherosclerotic disease within the visualized aortic arch and major branch vessels of the neck as detailed. 2. The bilateral common and cervical internal carotid arteries are patent without significant stenosis (50% or greater). Plaque at the right bifurcation results in less than 50% narrowing of the proximal right ICA.  3. Plaque within the V1 right vertebral artery with regions of mild-to-moderate stenosis. 4. Plaque at the origin of the left vertebral artery with mild/moderate ostial stenosis. Additionally, there is moderate/severe focal narrowing of the V2 left vertebral artery at the C5 level due to mass effect from degenerative facet hypertrophy/osteophyte formation. 5. Ectatic ascending thoracic aorta measuring 3.9 cm in diameter. 6. Lower neck lipoma. Electronically Signed   By: Kellie Simmering   On: 06/18/2019 15:08     Management plans discussed with the patient, family and they are in agreement.  CODE STATUS:     Code Status Orders  (From admission, onward)         Start     Ordered   06/18/19 0317  Full code  Continuous     06/18/19 0316        Code Status History    Date Active Date Inactive Code Status Order ID Comments User Context   06/01/2019 1924 06/11/2019 1417 Full Code [id number]  Yolonda Kida, MD Inpatient   06/01/2019 1758 06/01/2019 1924 Full Code [id number]  Belva Crome, MD Inpatient   Advance Care Planning Activity    Advance Directive Documentation     Most Recent Value  Type of Advance Directive  Living will, Healthcare Power of Attorney  Pre-existing out of facility DNR order (yellow form or pink MOST form)  --  "MOST" Form  in Place?  --      TOTAL TIME TAKING CARE OF THIS PATIENT: 38  minutes.    Gladstone Lighter M.D on 06/19/2019 at 1:57 PM  Between 7am to 6pm - Pager - [phone]  After 6pm go to [email or web address] - Proofreader  [facility]  Hospitalists  Office  [phone]  CC: Primary care physician; Guadalupe Maple, MD   Note: This dictation was prepared with Dragon dictation along with smaller phrase technology. Any transcriptional errors that result from this process are unintentional.

## 2019-06-19 NOTE — Progress Notes (Signed)
Patient given discharge instructions. IV taken out and tele monitor off. Patient verbalized understanding without any questions or concerns. Patient's wife picking him up.

## 2019-06-19 NOTE — Plan of Care (Signed)
  Problem: Clinical Measurements: Goal: Respiratory complications will improve Outcome: Progressing Goal: Cardiovascular complication will be avoided Outcome: Progressing   Problem: Activity: Goal: Risk for activity intolerance will decrease Outcome: Progressing   Problem: Nutrition: Goal: Adequate nutrition will be maintained Outcome: Progressing   Problem: Coping: Goal: Level of anxiety will decrease Outcome: Progressing   Problem: Elimination: Goal: Will not experience complications related to urinary retention Outcome: Progressing   Problem: Pain Managment: Goal: General experience of comfort will improve Outcome: Progressing   Problem: Safety: Goal: Ability to remain free from injury will improve Outcome: Progressing   Problem: Education: Goal: Knowledge of disease or condition will improve Outcome: Progressing Goal: Knowledge of secondary prevention will improve Outcome: Progressing Goal: Knowledge of patient specific risk factors addressed and post discharge goals established will improve Outcome: Progressing Goal: Individualized Educational Video(s) Outcome: Progressing   Problem: Coping: Goal: Will verbalize positive feelings about self Outcome: Progressing

## 2019-06-20 ENCOUNTER — Telehealth: Payer: Self-pay

## 2019-06-20 NOTE — Telephone Encounter (Signed)
Transition Care Management Follow-up Telephone Call  Date of discharge and from where: 06/19/2019 [facility]   How have you been since you were released from the hospital? "about the same "  Any questions or concerns? No   Items Reviewed:  Did the pt receive and understand the discharge instructions provided? Yes   Medications obtained and verified? Yes   Any new allergies since your discharge? No   Dietary orders reviewed? Yes  Do you have support at home? Yes   Functional Questionnaire: (I = Independent and D = Dependent) ADLs: i  Bathing/Dressing- i  Meal Prep- i  Eating- i  Maintaining continence- i  Transferring/Ambulation- d,cane  Managing Meds- i  Follow up appointments reviewed:   PCP Hospital f/u appt confirmed? Yes  Scheduled to see Dr.Crissman via phone call on 06/26/2019 @ [facility] f/u appt confirmed? Yes    Are transportation arrangements needed? No   If their condition worsens, is the pt aware to call PCP or go to the Emergency Dept.? Yes  Was the patient provided with contact information for the PCP's office or ED? Yes  Was to pt encouraged to call back with questions or concerns? Yes

## 2019-06-26 ENCOUNTER — Encounter: Payer: Self-pay | Admitting: Family Medicine

## 2019-06-26 ENCOUNTER — Other Ambulatory Visit: Payer: Self-pay

## 2019-06-26 ENCOUNTER — Ambulatory Visit (INDEPENDENT_AMBULATORY_CARE_PROVIDER_SITE_OTHER): Payer: Medicare Other | Admitting: Family Medicine

## 2019-06-26 DIAGNOSIS — G459 Transient cerebral ischemic attack, unspecified: Secondary | ICD-10-CM

## 2019-06-26 DIAGNOSIS — I2119 ST elevation (STEMI) myocardial infarction involving other coronary artery of inferior wall: Secondary | ICD-10-CM

## 2019-06-26 DIAGNOSIS — I10 Essential (primary) hypertension: Secondary | ICD-10-CM | POA: Diagnosis not present

## 2019-06-26 MED ORDER — CARVEDILOL 12.5 MG PO TABS: 12.5000 mg | ORAL_TABLET | Freq: Two times a day (BID) | ORAL | 2 refills | Status: DC

## 2019-06-26 MED ORDER — ATORVASTATIN CALCIUM 80 MG PO TABS: 80.0000 mg | ORAL_TABLET | Freq: Every day | ORAL | 3 refills | Status: DC

## 2019-06-26 NOTE — Assessment & Plan Note (Signed)
The current medical regimen is effective;  continue present plan and medications.  

## 2019-06-26 NOTE — Progress Notes (Addendum)
Wt 215 lb (97.5 kg)   BMI 29.16 kg/m    Subjective:    Patient ID: Andre Gallegos, male    DOB: [birth year], 68 y.o.   MRN: [id number]  HPI: Andre Gallegos is a 68 y.o. male  History of Present Illness: Copied from cardiology notes.  [name] is a 68 y.o.male patient who presents for a follow-up visit. Has a history of coronary artery disease, hyperlipidemia, hypertension. He suffered a ST elevation myocardial infarction. He has a history of a previous PCI however recently was seen at [facility] where he had a non-ST elevation myocardial infarction. Cardiac cath revealed an ejection fraction of 45% with a 99% heavily calcified RCA. He had a 90% first diagonal 60% proximal LAD. He was referred to [facility] for coronary artery bypass grafting where he received a LIMA to the LAD, saphenous vein graft to diagonal, saphenous vein graft to the posterior descending. Postoperative course was unremarkable until 2 weeks post CABG she suffered a tiny left thalamic stroke. He was discharged on aspirin and Plavix and atorvastatin. He has had no further neurologic events and his symptoms resolved fairly quickly consistent with a TIA. He is currently doing well without any difficulty.  Patient continues to do well with no further symptoms wants to make sure he does not have any more TIA symptoms. Transition of Care Hospital Follow up.   Hospital/Facility: [facility] D/C Physician: Several different physicians see discharge summary for details D/C Date: June 19, 2019  Records Requested: na Records Received: na Records Reviewed: Today  Diagnoses on Discharge: Coronary artery disease first then TIA  Date of interactive Contact within 48 hours of discharge:  Contact was through: Phone  Date of 7 day or 14 day face-to-face visit:    See chart for details  Outpatient Encounter Medications as of 06/26/2019  Medication Sig  . amLODipine (NORVASC) 10 MG tablet Take 1 tablet (10 mg  total) by mouth daily.  Marland Kitchen aspirin (ASPIRIN EC) 81 MG EC tablet Take 81 mg by mouth daily. Swallow whole.  Marland Kitchen atorvastatin (LIPITOR) 80 MG tablet Take 1 tablet (80 mg total) by mouth daily at 6 PM.  . carvedilol (COREG) 12.5 MG tablet Take 1 tablet (12.5 mg total) by mouth 2 (two) times daily with a meal.  . clopidogrel (PLAVIX) 75 MG tablet Take 1 tablet (75 mg total) by mouth daily.  Marland Kitchen ezetimibe (ZETIA) 10 MG tablet Take 1 tablet (10 mg total) by mouth daily. (Patient taking differently: Take 10 mg by mouth every evening. )  . ferrous sulfate 325 (65 FE) MG tablet Take 1 tablet (325 mg total) by mouth daily with breakfast.  . FIBER PO Take 2 tablets by mouth 2 (two) times daily.  . folic acid (FOLVITE) 1 MG tablet Take 1 tablet (1 mg total) by mouth daily.  . Lactobacillus (PROBIOTIC ACIDOPHILUS PO) Take 1 capsule by mouth at bedtime.   . [DISCONTINUED] atorvastatin (LIPITOR) 80 MG tablet Take 1 tablet (80 mg total) by mouth daily at 6 PM.  . [DISCONTINUED] carvedilol (COREG) 12.5 MG tablet Take 1 tablet (12.5 mg total) by mouth 2 (two) times daily with a meal.   No facility-administered encounter medications on file as of 06/26/2019.     Diagnostic Tests Reviewed/Disposition: Done  Consults: Reviewed and discussed upcoming appointments and consults  Discharge Instructions reviewed and refilled medications  Disease/illness Education: [place] Discussions/Referrals:na  Establishment or re-establishment of referral orders for community  resources:na  Discussion with other health care providers:na  Assessment and Support of treatment regimen adherence: Discussed  Appointments Coordinated with: na  Education for self-management, independent living, and ADLs: na  Relevant past medical, surgical, family and social history reviewed and updated as indicated. Interim medical history since our last visit reviewed. Allergies and medications reviewed and updated.   Review of Systems  Constitutional: Negative.   Respiratory: Negative.   Cardiovascular: Negative.     Per HPI unless specifically indicated above     Objective:    Wt 215 lb (97.5 kg)   BMI 29.16 kg/m   Wt Readings from Last 3 Encounters:  06/26/19 215 lb (97.5 kg)  06/19/19 215 lb (97.5 kg)  06/11/19 230 lb 9.6 oz (104.6 kg)    Physical Exam  Results for orders placed or performed during the hospital encounter of 06/17/19  SARS CORONAVIRUS 2 (TAT 6-24 HRS) Nasopharyngeal Nasopharyngeal Swab   Specimen: Nasopharyngeal Swab  Result Value Ref Range   SARS Coronavirus 2 NEGATIVE NEGATIVE  Protime-INR  Result Value Ref Range   Prothrombin Time 14.7 11.4 - 15.2 seconds   INR 1.2 0.8 - 1.2  APTT  Result Value Ref Range   aPTT 26 24 - 36 seconds  CBC  Result Value Ref Range   WBC 15.5 (H) 4.0 - 10.5 K/uL   RBC 3.95 (L) 4.22 - 5.81 MIL/uL   Hemoglobin 12.1 (L) 13.0 - 17.0 g/dL   HCT 36.3 (L) 39.0 - 52.0 %   MCV 91.9 80.0 - 100.0 fL   MCH 30.6 26.0 - 34.0 pg   MCHC 33.3 30.0 - 36.0 g/dL   RDW 12.7 11.5 - 15.5 %   Platelets 476 (H) 150 - 400 K/uL   nRBC 0.0 0.0 - 0.2 %  Differential  Result Value Ref Range   Neutrophils Relative % 79 %   Neutro Abs 12.4 (H) 1.7 - 7.7 K/uL   Lymphocytes Relative 10 %   Lymphs Abs 1.5 0.7 - 4.0 K/uL   Monocytes Relative 6 %   Monocytes Absolute 0.9 0.1 - 1.0 K/uL   Eosinophils Relative 3 %   Eosinophils Absolute 0.4 0.0 - 0.5 K/uL   Basophils Relative 1 %   Basophils Absolute 0.1 0.0 - 0.1 K/uL   Immature Granulocytes 1 %   Abs Immature Granulocytes 0.15 (H) 0.00 - 0.07 K/uL  Comprehensive metabolic panel  Result Value Ref Range   Sodium 133 (L) 135 - 145 mmol/L   Potassium 4.5 3.5 - 5.1 mmol/L   Chloride 102 98 - 111 mmol/L   CO2 20 (L) 22 - 32 mmol/L   Glucose, Bld 143 (H) 70 - 99 mg/dL   BUN 32 (H) 8 - 23 mg/dL   Creatinine, Ser 1.50 (H) 0.61 - 1.24 mg/dL   Calcium 9.3 8.9 - 10.3 mg/dL   Total Protein 7.3 6.5 - 8.1 g/dL    Albumin 3.9 3.5 - 5.0 g/dL   AST 47 (H) 15 - 41 U/L   ALT 121 (H) 0 - 44 U/L   Alkaline Phosphatase 132 (H) 38 - 126 U/L   Total Bilirubin 0.9 0.3 - 1.2 mg/dL   GFR calc non Af Amer 47 (L) >60 mL/min   GFR calc Af Amer 55 (L) >60 mL/min   Anion gap 11 5 - 15  HIV antibody (Routine Testing)  Result Value Ref Range   HIV Screen 4th Generation wRfx Non Reactive Non Reactive  Hemoglobin A1c  Result Value Ref Range  Hgb A1c MFr Bld 5.5 4.8 - 5.6 %   Mean Plasma Glucose 111 mg/dL  Lipid panel  Result Value Ref Range   Cholesterol 100 0 - 200 mg/dL   Triglycerides 86 <150 mg/dL   HDL 23 (L) >40 mg/dL   Total CHOL/HDL Ratio 4.3 RATIO   VLDL 17 0 - 40 mg/dL   LDL Cholesterol 60 0 - 99 mg/dL  Basic metabolic panel  Result Value Ref Range   Sodium 135 135 - 145 mmol/L   Potassium 4.5 3.5 - 5.1 mmol/L   Chloride 103 98 - 111 mmol/L   CO2 21 (L) 22 - 32 mmol/L   Glucose, Bld 140 (H) 70 - 99 mg/dL   BUN 24 (H) 8 - 23 mg/dL   Creatinine, Ser 1.33 (H) 0.61 - 1.24 mg/dL   Calcium 9.4 8.9 - 10.3 mg/dL   GFR calc non Af Amer 55 (L) >60 mL/min   GFR calc Af Amer >60 >60 mL/min   Anion gap 11 5 - 15  ECHOCARDIOGRAM COMPLETE  Result Value Ref Range   Weight 3,473.6 oz   Height 72 in   BP 131/73 mmHg      Assessment & Plan:   Problem List Items Addressed This Visit      Cardiovascular and Mediastinum   Benign essential hypertension    The current medical regimen is effective;  continue present plan and medications.       Relevant Medications   atorvastatin (LIPITOR) 80 MG tablet   carvedilol (COREG) 12.5 MG tablet   Acute ST elevation myocardial infarction (STEMI) of inferior wall (HCC)    Recovering well bypass graft surgery      Relevant Medications   atorvastatin (LIPITOR) 80 MG tablet   carvedilol (COREG) 12.5 MG tablet   TIA (transient ischemic attack)    Discussed TIA treatment and care. Discussed staying on atorvastatin long-term Patient will discuss with neurology  about how long to stay on Plavix and aspirin      Relevant Medications   atorvastatin (LIPITOR) 80 MG tablet   carvedilol (COREG) 12.5 MG tablet     Telemedicine using audio/video telecommunications for a synchronous communication visit. Today's visit due to COVID-19 isolation precautions I connected with and verified that I am speaking with the correct person using two identifiers.   I discussed the limitations, risks, security and privacy concerns of performing an evaluation and management service by telecommunication and the availability of in person appointments. I also discussed with the patient that there may be a patient responsible charge related to this service. The patient expressed understanding and agreed to proceed. The patient's location is home. I am at home.   I discussed the assessment and treatment plan with the patient. The patient was provided an opportunity to ask questions and all were answered. The patient agreed with the plan and demonstrated an understanding of the instructions.   The patient was advised to call back or seek an in-person evaluation if the symptoms worsen or if the condition fails to improve as anticipated.   I provided 21+ minutes of time during this encounter. Discussed foot pain and new shoes Follow up plan: Return if symptoms worsen or fail to improve, for As scheduled.

## 2019-06-26 NOTE — Assessment & Plan Note (Signed)
Recovering well bypass graft surgery

## 2019-06-26 NOTE — Assessment & Plan Note (Signed)
Discussed TIA treatment and care. Discussed staying on atorvastatin long-term Patient will discuss with neurology about how long to stay on Plavix and aspirin

## 2019-07-02 ENCOUNTER — Other Ambulatory Visit: Payer: Self-pay | Admitting: Cardiothoracic Surgery

## 2019-07-02 DIAGNOSIS — Z951 Presence of aortocoronary bypass graft: Secondary | ICD-10-CM

## 2019-07-03 ENCOUNTER — Telehealth (INDEPENDENT_AMBULATORY_CARE_PROVIDER_SITE_OTHER): Payer: Self-pay | Admitting: Cardiothoracic Surgery

## 2019-07-03 ENCOUNTER — Ambulatory Visit
Admission: RE | Admit: 2019-07-03 | Discharge: 2019-07-03 | Disposition: A | Discharge status: Home / Self Care | Payer: Medicare Other | Source: Ambulatory Visit | Attending: Cardiothoracic Surgery | Admitting: Cardiothoracic Surgery

## 2019-07-03 ENCOUNTER — Telehealth: Payer: Self-pay | Admitting: Cardiothoracic Surgery

## 2019-07-03 DIAGNOSIS — Z951 Presence of aortocoronary bypass graft: Secondary | ICD-10-CM

## 2019-07-03 NOTE — Telephone Encounter (Signed)
[address]       [place] [address]             [phone]     CARDIOTHORACIC SURGERY TELEPHONE VIRTUAL OFFICE NOTE  Referring Provider is No ref. provider found Primary Cardiologist is Sinclair Grooms, MD PCP is Guadalupe Maple, MD   HPI:  I spoke with Andre Gallegos (DOB Feb 23, 1951 ) via telephone on 07/03/2019 at 6:22 PM and verified that I was speaking with the correct person using more than one form of identification.  We discussed the reason(s) for conducting our visit virtually instead of in-person.  The patient expressed understanding the circumstances and agreed to proceed as described.  Mr. Langel had urgent multivessel CABG in early September after presenting with a STEMI at [facility] which could not be opened with PCI.  He had positive cardiac enzymes but postop echo showed EF approximately 50%.  He progressed well after surgery and was discharged home in about 6 days.  He had some transient atrial fibrillation but converted to sinus rhythm.  Because of his STEMI and unstable angina he was discharged on both Plavix and aspirin and a statin.  A few days after discharge he had a TIA and was admitted [facility].  His symptoms resolved and scan results were minimal.  His preoperative carotid Dopplers showed no significant stenosis.  The patient continues to do well at home on his aspirin and Plavix.  No recurrent TIAs.  He is walking almost 2 miles a day.  Strength and appetite and exercise tolerance are improving.  His chest incision is healing well.  No peripheral edema.  Leg incision dry  Although the patient had a temperature of 100.5 degrees during office screening when he went home he checked his temperature and it was 98.8.  He has mild productive cough of clear white sputum.  A chest x-ray was performed today before he left the office which shows clear lung fields, sternal wires intact, no pleural effusion.  Overall  the patient is doing very well   Current Outpatient Medications  Medication Sig Dispense Refill  . amLODipine (NORVASC) 10 MG tablet Take 1 tablet (10 mg total) by mouth daily. 90 tablet 4  . aspirin (ASPIRIN EC) 81 MG EC tablet Take 81 mg by mouth daily. Swallow whole.    Marland Kitchen atorvastatin (LIPITOR) 80 MG tablet Take 1 tablet (80 mg total) by mouth daily at 6 PM. 90 tablet 3  . carvedilol (COREG) 12.5 MG tablet Take 1 tablet (12.5 mg total) by mouth 2 (two) times daily with a meal. 180 tablet 2  . clopidogrel (PLAVIX) 75 MG tablet Take 1 tablet (75 mg total) by mouth daily. 30 tablet 1  . ezetimibe (ZETIA) 10 MG tablet Take 1 tablet (10 mg total) by mouth daily. (Patient taking differently: Take 10 mg by mouth every evening. ) 90 tablet 4  . ferrous sulfate 325 (65 FE) MG tablet Take 1 tablet (325 mg total) by mouth daily with breakfast. 30 tablet 3  . FIBER PO Take 2 tablets by mouth 2 (two) times daily.    . folic acid (FOLVITE) 1 MG tablet Take 1 tablet (1 mg total) by mouth daily. 30 tablet 3  . Lactobacillus (PROBIOTIC ACIDOPHILUS PO) Take 1 capsule by mouth at bedtime.      No current facility-administered medications for this visit.      Diagnostic Tests:  Chest x-ray performed today is  clear   Impression:  Excellent early recovery after urgent multivessel CABG for RCA disease and STEMI.  Resolved TIA after discharge from hospital with neurology appointment pending.  Patient is 1 month after surgery and is now able to drive, he may lift up to 10 pounds, and do normal activities.  Plan:  Patient will be set up for a postop surgical office visit for review of progress but without chest x-ray in 4 weeks.  He will continue his current meds.  I will refer him to the cardiac rehab program at [facility]. I have recommended he start taking some Mucinex and Allegra or Claritin for his cough.  I discussed limitations of evaluation and management via telephone.  The patient was  advised to call back for repeat telephone consultation or to seek an in-person evaluation if questions arise or the patient's clinical condition changes in any significant manner.  I spent in excess of 10 minutes of non-face-to-face time during the conduct of this telephone virtual office consultation.  Level 1  (99441)             5-10 minutes Level 2  (99442)            11-20 minutes Level 3  (99443)            21-30 minutes    07/03/2019 6:22 PM

## 2019-07-09 ENCOUNTER — Other Ambulatory Visit: Payer: Self-pay | Admitting: *Deleted

## 2019-07-09 DIAGNOSIS — Z951 Presence of aortocoronary bypass graft: Secondary | ICD-10-CM

## 2019-07-31 ENCOUNTER — Encounter: Payer: Self-pay | Admitting: Cardiothoracic Surgery

## 2019-07-31 ENCOUNTER — Other Ambulatory Visit: Payer: Self-pay

## 2019-07-31 ENCOUNTER — Ambulatory Visit (INDEPENDENT_AMBULATORY_CARE_PROVIDER_SITE_OTHER): Payer: Self-pay | Admitting: Cardiothoracic Surgery

## 2019-07-31 VITALS — BP 141/80 | HR 82 | Temp 96.8°F | Resp 16 | Ht 70.47 in | Wt 211.8 lb

## 2019-07-31 DIAGNOSIS — I2510 Atherosclerotic heart disease of native coronary artery without angina pectoris: Secondary | ICD-10-CM

## 2019-07-31 DIAGNOSIS — Z951 Presence of aortocoronary bypass graft: Secondary | ICD-10-CM

## 2019-07-31 NOTE — Progress Notes (Signed)
PCP is Guadalupe Maple, MD Referring Provider is Belva Crome, MD  Chief Complaint  Patient presents with  . Routine Post Op    4 wk f/u with a CXR s/p CABG X 3...06/05/19    HPI: Schedule visit 2 months after urgent CABG x3 for unstable angina.  Patient has done well.  He is walking 2 miles daily.  Takes him 40 minutes.  He has had no recurrent angina, no signs of heart failure, no edema.  Surgical incisions are well-healed.  He has minimal surgical pain.  Chest x-ray performed today shows clear lung fields with stable cardiac silhouette, sternal wires intact. Was placed on postoperative Plavix which can be discontinued now after he finishes his current supply.  Patient knows he should not lift more than 10 pounds until 3 months after surgery or December 9.  Past Medical History:  Diagnosis Date  . Arthritis   . CAD (coronary artery disease)   . Hyperlipidemia   . Myocardial infarction [facility]) 2005    Past Surgical History:  Procedure Laterality Date  . APPENDECTOMY    . CARDIAC CATHETERIZATION    . COLONOSCOPY WITH PROPOFOL N/A 11/01/2016   Procedure: COLONOSCOPY WITH PROPOFOL;  Surgeon: Jonathon Bellows, MD;  Location: [facility] END[facility];  Service: Endoscopy;  Laterality: N/A;  . CORONARY ANGIOPLASTY    . CORONARY ARTERY BYPASS GRAFT N/A 06/05/2019   Procedure: CORONARY ARTERY BYPASS GRAFTING (CABG) x 3 WITH ENDOSCOPIC HARVESTING OF RIGHT GREATER SAPHENOUS VEIN. LIMA TO LAD.;  Surgeon: Ivin Poot, MD;  Location: [place];  Service: Open Heart Surgery;  Laterality: N/A;  . CORONARY/GRAFT ACUTE MI REVASCULARIZATION N/A 06/01/2019   Procedure: Coronary/Graft Acute MI Revascularization;  Surgeon: Yolonda Kida, MD;  Location: [place] [facility];  Service: Cardiovascular;  Laterality: N/A;  . LEFT HEART CATH AND CORONARY ANGIOGRAPHY N/A 06/01/2019   Procedure: LEFT HEART CATH AND CORONARY ANGIOGRAPHY;  Surgeon: Yolonda Kida, MD;  Location: [place] [facility];  Service: Cardiovascular;   Laterality: N/A;  . TEE WITHOUT CARDIOVERSION N/A 06/05/2019   Procedure: TRANSESOPHAGEAL ECHOCARDIOGRAM (TEE);  Surgeon: Prescott Gum, Collier Salina, MD;  Location: [place];  Service: Open Heart Surgery;  Laterality: N/A;    Family History  Problem Relation Age of Onset  . Lung cancer Father     Social History Social History   Tobacco Use  . Smoking status: Former Smoker    Types: Cigarettes    Quit date: 1990    Years since quitting: 30.8  . Smokeless tobacco: Former Systems developer    Types: Snuff  . Tobacco comment: quit 30 years ago   Substance Use Topics  . Alcohol use: Not Currently    Alcohol/week: 4.0 standard drinks    Types: 4 Cans of beer per week    Comment: occasionally   . Drug use: No    Current Outpatient Medications  Medication Sig Dispense Refill  . amLODipine (NORVASC) 10 MG tablet Take 1 tablet (10 mg total) by mouth daily. 90 tablet 4  . aspirin (ASPIRIN EC) 81 MG EC tablet Take 81 mg by mouth daily. Swallow whole.    Marland Kitchen atorvastatin (LIPITOR) 80 MG tablet Take 1 tablet (80 mg total) by mouth daily at 6 PM. 90 tablet 3  . carvedilol (COREG) 12.5 MG tablet Take 1 tablet (12.5 mg total) by mouth 2 (two) times daily with a meal. 180 tablet 2  . clopidogrel (PLAVIX) 75 MG tablet Take 1 tablet (75 mg total) by mouth daily.  30 tablet 1  . ezetimibe (ZETIA) 10 MG tablet Take 1 tablet (10 mg total) by mouth daily. (Patient taking differently: Take 10 mg by mouth every evening. ) 90 tablet 4  . ferrous sulfate 325 (65 FE) MG tablet Take 1 tablet (325 mg total) by mouth daily with breakfast. 30 tablet 3  . FIBER PO Take 2 tablets by mouth 2 (two) times daily.    . folic acid (FOLVITE) 1 MG tablet Take 1 tablet (1 mg total) by mouth daily. 30 tablet 3  . Lactobacillus (PROBIOTIC ACIDOPHILUS PO) Take 1 capsule by mouth at bedtime.      No current facility-administered medications for this visit.     No Known Allergies  Review of Systems  Good appetite Sleeping well Good exercise  tolerance  BP (!) 141/80 (BP Location: Right Arm, Patient Position: Sitting, Cuff Size: Normal)   Pulse 82   Temp (!) 96.8 F (36 C)   Resp 16   Ht 5' 10.47" (1.79 m)   Wt 211 lb 12.8 oz (96.1 kg)   SpO2 96% Comment: RA  BMI 29.99 kg/m  Physical Exam       Exam    General- alert and comfortable    Neck- no JVD, no cervical adenopathy palpable, no carotid bruit   Lungs- clear without rales, wheezes   Cor- regular rate and rhythm, no murmur , gallop   Abdomen- soft, non-tender   Extremities - warm, non-tender, minimal edema   Neuro- oriented, appropriate, no focal weakness  Diagnostic Tests: Chest x-ray today shows clear lung fields, sternal wires intact.  Impression: Excellent early recovery 8 weeks after urgent CABG x3. Patient will continue his current medications but stop the Plavix once the current supply runs out. He will be able to resume normal activities and lift more than 10 pounds as of December 9. Importance of heart healthy lifestyle including 30 minutes of walking 5 days out of 7 and a heart healthy diet reviewed with patient.  Plan: He will return as needed and be followed by his cardiologist Dr. Ubaldo Glassing at [facility].   Len Childs, MD [facility] [phone]

## 2019-08-21 ENCOUNTER — Ambulatory Visit: Payer: Medicare Other | Admitting: Interventional Cardiology

## 2019-10-16 ENCOUNTER — Encounter: Payer: Medicare Other | Admitting: Family Medicine

## 2019-10-16 ENCOUNTER — Encounter: Payer: Self-pay | Admitting: Family Medicine

## 2019-10-17 ENCOUNTER — Ambulatory Visit (INDEPENDENT_AMBULATORY_CARE_PROVIDER_SITE_OTHER): Payer: Medicare PPO | Admitting: Family Medicine

## 2019-10-17 ENCOUNTER — Other Ambulatory Visit: Payer: Self-pay

## 2019-10-17 ENCOUNTER — Encounter: Payer: Self-pay | Admitting: Family Medicine

## 2019-10-17 VITALS — BP 132/76 | HR 68 | Temp 98.0°F | Ht 70.0 in | Wt 236.0 lb

## 2019-10-17 DIAGNOSIS — N400 Enlarged prostate without lower urinary tract symptoms: Secondary | ICD-10-CM | POA: Diagnosis not present

## 2019-10-17 DIAGNOSIS — I10 Essential (primary) hypertension: Secondary | ICD-10-CM

## 2019-10-17 DIAGNOSIS — E782 Mixed hyperlipidemia: Secondary | ICD-10-CM

## 2019-10-17 DIAGNOSIS — Z0000 Encounter for general adult medical examination without abnormal findings: Secondary | ICD-10-CM | POA: Diagnosis not present

## 2019-10-17 DIAGNOSIS — Z8673 Personal history of transient ischemic attack (TIA), and cerebral infarction without residual deficits: Secondary | ICD-10-CM

## 2019-10-17 DIAGNOSIS — I2510 Atherosclerotic heart disease of native coronary artery without angina pectoris: Secondary | ICD-10-CM

## 2019-10-17 DIAGNOSIS — I252 Old myocardial infarction: Secondary | ICD-10-CM

## 2019-10-17 LAB — UA/M W/RFLX CULTURE, ROUTINE
Bilirubin, UA: NEGATIVE
Glucose, UA: NEGATIVE
Ketones, UA: NEGATIVE
Leukocytes,UA: NEGATIVE
Nitrite, UA: NEGATIVE
Protein,UA: NEGATIVE
RBC, UA: NEGATIVE
Specific Gravity, UA: 1.025 (ref 1.005–1.030)
Urobilinogen, Ur: 0.2 mg/dL (ref 0.2–1.0)
pH, UA: 5.5 (ref 5.0–7.5)

## 2019-10-17 NOTE — Assessment & Plan Note (Signed)
No new sxs, recheck PSA and continue to monitor

## 2019-10-17 NOTE — Assessment & Plan Note (Signed)
Recheck lipids, continue current regimen 

## 2019-10-17 NOTE — Assessment & Plan Note (Signed)
Following with Cardiology, no new anginal episodes. Continue current regimen, recheck labs and maintain good BP control. Lifestyle habits reviewed

## 2019-10-17 NOTE — Assessment & Plan Note (Signed)
BPs stable and under good control, continue current regimen 

## 2019-10-17 NOTE — Progress Notes (Signed)
BP 132/76   Pulse 68   Temp 98 F (36.7 C) (Oral)   Ht 5\' 10"  (1.778 m)   Wt 236 lb (107 kg)   SpO2 96%   BMI 33.86 kg/m    Subjective:    Patient ID: Andre Gallegos, male    DOB: 1950-12-23, 69 y.o.   MRN: [id number]  HPI: Andre Gallegos is a 69 y.o. male presenting on 10/17/2019 for comprehensive medical examination. Current medical complaints include:see below  He currently lives with: Interim Problems from his last visit: no  Depression Screen done today and results listed below:  Depression screen [facility] 2/9 10/31/2018 10/15/2018 04/05/2018 10/05/2017 09/22/2017  Decreased Interest 0 0 0 0 0  Down, Depressed, Hopeless 0 0 0 0 0  PHQ - 2 Score 0 0 0 0 0  Altered sleeping - 0 - - -  Tired, decreased energy - 0 - - -  Change in appetite - 0 - - -  Feeling bad or failure about yourself  - 0 - - -  Trouble concentrating - 0 - - -  Moving slowly or fidgety/restless - 0 - - -  Suicidal thoughts - 0 - - -  PHQ-9 Score - 0 - - -    The patient does not have a history of falls. I did complete a risk assessment for falls. A plan of care for falls was documented.   Past Medical History:  Past Medical History:  Diagnosis Date  . Arthritis   . CAD (coronary artery disease)   . Hyperlipidemia   . Myocardial infarction [facility]) 2005    Surgical History:  Past Surgical History:  Procedure Laterality Date  . APPENDECTOMY    . CARDIAC CATHETERIZATION    . COLONOSCOPY WITH PROPOFOL N/A 11/01/2016   Procedure: COLONOSCOPY WITH PROPOFOL;  Surgeon: Jonathon Bellows, MD;  Location: [facility] ENDOSCOPY;  Service: Endoscopy;  Laterality: N/A;  . CORONARY ANGIOPLASTY    . CORONARY ARTERY BYPASS GRAFT N/A 06/05/2019   Procedure: CORONARY ARTERY BYPASS GRAFTING (CABG) x 3 WITH ENDOSCOPIC HARVESTING OF RIGHT GREATER SAPHENOUS VEIN. LIMA TO LAD.;  Surgeon: Ivin Poot, MD;  Location: [place];  Service: Open Heart Surgery;  Laterality: N/A;  . CORONARY/GRAFT ACUTE MI REVASCULARIZATION N/A 06/01/2019   Procedure: Coronary/Graft Acute MI Revascularization;  Surgeon: Yolonda Kida, MD;  Location: [place] [facility];  Service: Cardiovascular;  Laterality: N/A;  . LEFT HEART CATH AND CORONARY ANGIOGRAPHY N/A 06/01/2019   Procedure: LEFT HEART CATH AND CORONARY ANGIOGRAPHY;  Surgeon: Yolonda Kida, MD;  Location: [place] [facility];  Service: Cardiovascular;  Laterality: N/A;  . TEE WITHOUT CARDIOVERSION N/A 06/05/2019   Procedure: TRANSESOPHAGEAL ECHOCARDIOGRAM (TEE);  Surgeon: Prescott Gum, Collier Salina, MD;  Location: [place];  Service: Open Heart Surgery;  Laterality: N/A;    Medications:  Current Outpatient Medications on File Prior to Visit  Medication Sig  . amLODipine (NORVASC) 10 MG tablet Take 1 tablet (10 mg total) by mouth daily.  Marland Kitchen aspirin (ASPIRIN EC) 81 MG EC tablet Take 81 mg by mouth daily. Swallow whole.  Marland Kitchen atorvastatin (LIPITOR) 80 MG tablet Take 1 tablet (80 mg total) by mouth daily at 6 PM.  . carvedilol (COREG) 12.5 MG tablet Take 1 tablet (12.5 mg total) by mouth 2 (two) times daily with a meal.  . clopidogrel (PLAVIX) 75 MG tablet Take 1 tablet (75 mg total) by mouth daily.  Marland Kitchen ezetimibe (ZETIA) 10 MG tablet Take 1 tablet (10 mg  total) by mouth daily. (Patient taking differently: Take 10 mg by mouth every evening. )  . FIBER PO Take 2 tablets by mouth 2 (two) times daily.  . Lactobacillus (PROBIOTIC ACIDOPHILUS PO) Take 1 capsule by mouth at bedtime.   . meloxicam (MOBIC) 7.5 MG tablet Take by mouth daily.    No current facility-administered medications on file prior to visit.    Allergies:  No Known Allergies  Social History:  Social History   Socioeconomic History  . Marital status: Married    Spouse name: Not on file  . Number of children: Not on file  . Years of education: Not on file  . Highest education level: Some college, no degree  Occupational History  . Occupation: retired  Tobacco Use  . Smoking status: Former Smoker    Types: Cigarettes    Quit  date: 1990    Years since quitting: 31.0  . Smokeless tobacco: Former Systems developer    Types: Snuff  . Tobacco comment: quit 30 years ago   Substance and Sexual Activity  . Alcohol use: Not Currently    Alcohol/week: 4.0 standard drinks    Types: 4 Cans of beer per week    Comment: occasionally   . Drug use: No  . Sexual activity: Not on file  Other Topics Concern  . Not on file  Social History Narrative  . Not on file   Social Determinants of Health   Financial Resource Strain:   . Difficulty of Paying Living Expenses: Not on file  Food Insecurity:   . Worried About Charity fundraiser in the Last Year: Not on file  . Ran Out of Food in the Last Year: Not on file  Transportation Needs:   . Lack of Transportation (Medical): Not on file  . Lack of Transportation (Non-Medical): Not on file  Physical Activity:   . Days of Exercise per Week: Not on file  . Minutes of Exercise per Session: Not on file  Stress:   . Feeling of Stress : Not on file  Social Connections:   . Frequency of Communication with Friends and Family: Not on file  . Frequency of Social Gatherings with Friends and Family: Not on file  . Attends Religious Services: Not on file  . Active Member of Clubs or Organizations: Not on file  . Attends Archivist Meetings: Not on file  . Marital Status: Not on file  Intimate Partner Violence:   . Fear of Current or Ex-Partner: Not on file  . Emotionally Abused: Not on file  . Physically Abused: Not on file  . Sexually Abused: Not on file   Social History   Tobacco Use  Smoking Status Former Smoker  . Types: Cigarettes  . Quit date: 104  . Years since quitting: 31.0  Smokeless Tobacco Former Systems developer  . Types: Snuff  Tobacco Comment   quit 30 years ago    Social History   Substance and Sexual Activity  Alcohol Use Not Currently  . Alcohol/week: 4.0 standard drinks  . Types: 4 Cans of beer per week   Comment: occasionally     Family History:  Family  History  Problem Relation Age of Onset  . Lung cancer Father     Past medical history, surgical history, medications, allergies, family history and social history reviewed with patient today and changes made to appropriate areas of the chart.   Review of Systems - General ROS: negative Psychological ROS: negative Ophthalmic ROS: negative ENT  ROS: negative Allergy and Immunology ROS: negative Hematological and Lymphatic ROS: negative Endocrine ROS: negative Respiratory ROS: no cough, shortness of breath, or wheezing Cardiovascular ROS: no chest pain or dyspnea on exertion Gastrointestinal ROS: no abdominal pain, change in bowel habits, or black or bloody stools Genito-Urinary ROS: no dysuria, trouble voiding, or hematuria Musculoskeletal ROS: negative Neurological ROS: no TIA or stroke symptoms Dermatological ROS: negative All other ROS negative except what is listed above and in the HPI.      Objective:    BP 132/76   Pulse 68   Temp 98 F (36.7 C) (Oral)   Ht 5\' 10"  (1.778 m)   Wt 236 lb (107 kg)   SpO2 96%   BMI 33.86 kg/m   Wt Readings from Last 3 Encounters:  10/17/19 236 lb (107 kg)  07/31/19 211 lb 12.8 oz (96.1 kg)  06/26/19 215 lb (97.5 kg)    Physical Exam Vitals and nursing note reviewed.  Constitutional:      General: He is not in acute distress.    Appearance: He is well-developed.  HENT:     Head: Atraumatic.     Right Ear: Tympanic membrane and external ear normal.     Left Ear: Tympanic membrane and external ear normal.     Nose: Nose normal.     Mouth/Throat:     Mouth: Mucous membranes are moist.     Pharynx: Oropharynx is clear.  Eyes:     General: No scleral icterus.    Conjunctiva/sclera: Conjunctivae normal.     Pupils: Pupils are equal, round, and reactive to light.  Cardiovascular:     Rate and Rhythm: Normal rate and regular rhythm.     Heart sounds: Normal heart sounds. No murmur.  Pulmonary:     Effort: Pulmonary effort is normal.  No respiratory distress.     Breath sounds: Normal breath sounds.  Abdominal:     General: Bowel sounds are normal. There is no distension.     Palpations: Abdomen is soft. There is no mass.     Tenderness: There is no abdominal tenderness. There is no guarding.  Genitourinary:    Comments: GU exam declined Musculoskeletal:        General: No tenderness. Normal range of motion.     Cervical back: Normal range of motion and neck supple.  Skin:    General: Skin is warm and dry.     Findings: No rash.  Neurological:     General: No focal deficit present.     Mental Status: He is alert and oriented to person, place, and time.     Deep Tendon Reflexes: Reflexes are normal and symmetric.  Psychiatric:        Mood and Affect: Mood normal.        Behavior: Behavior normal.        Thought Content: Thought content normal.        Judgment: Judgment normal.     Results for orders placed or performed during the hospital encounter of 06/17/19  SARS CORONAVIRUS 2 (TAT 6-24 HRS) Nasopharyngeal Nasopharyngeal Swab   Specimen: Nasopharyngeal Swab  Result Value Ref Range   SARS Coronavirus 2 NEGATIVE NEGATIVE  Protime-INR  Result Value Ref Range   Prothrombin Time 14.7 11.4 - 15.2 seconds   INR 1.2 0.8 - 1.2  APTT  Result Value Ref Range   aPTT 26 24 - 36 seconds  CBC  Result Value Ref Range   WBC 15.5 (H) 4.0 -  10.5 K/uL   RBC 3.95 (L) 4.22 - 5.81 MIL/uL   Hemoglobin 12.1 (L) 13.0 - 17.0 g/dL   HCT 36.3 (L) 39.0 - 52.0 %   MCV 91.9 80.0 - 100.0 fL   MCH 30.6 26.0 - 34.0 pg   MCHC 33.3 30.0 - 36.0 g/dL   RDW 12.7 11.5 - 15.5 %   Platelets 476 (H) 150 - 400 K/uL   nRBC 0.0 0.0 - 0.2 %  Differential  Result Value Ref Range   Neutrophils Relative % 79 %   Neutro Abs 12.4 (H) 1.7 - 7.7 K/uL   Lymphocytes Relative 10 %   Lymphs Abs 1.5 0.7 - 4.0 K/uL   Monocytes Relative 6 %   Monocytes Absolute 0.9 0.1 - 1.0 K/uL   Eosinophils Relative 3 %   Eosinophils Absolute 0.4 0.0 - 0.5 K/uL    Basophils Relative 1 %   Basophils Absolute 0.1 0.0 - 0.1 K/uL   Immature Granulocytes 1 %   Abs Immature Granulocytes 0.15 (H) 0.00 - 0.07 K/uL  Comprehensive metabolic panel  Result Value Ref Range   Sodium 133 (L) 135 - 145 mmol/L   Potassium 4.5 3.5 - 5.1 mmol/L   Chloride 102 98 - 111 mmol/L   CO2 20 (L) 22 - 32 mmol/L   Glucose, Bld 143 (H) 70 - 99 mg/dL   BUN 32 (H) 8 - 23 mg/dL   Creatinine, Ser 1.50 (H) 0.61 - 1.24 mg/dL   Calcium 9.3 8.9 - 10.3 mg/dL   Total Protein 7.3 6.5 - 8.1 g/dL   Albumin 3.9 3.5 - 5.0 g/dL   AST 47 (H) 15 - 41 U/L   ALT 121 (H) 0 - 44 U/L   Alkaline Phosphatase 132 (H) 38 - 126 U/L   Total Bilirubin 0.9 0.3 - 1.2 mg/dL   GFR calc non Af Amer 47 (L) >60 mL/min   GFR calc Af Amer 55 (L) >60 mL/min   Anion gap 11 5 - 15  HIV antibody (Routine Testing)  Result Value Ref Range   HIV Screen 4th Generation wRfx Non Reactive Non Reactive  Hemoglobin A1c  Result Value Ref Range   Hgb A1c MFr Bld 5.5 4.8 - 5.6 %   Mean Plasma Glucose 111 mg/dL  Lipid panel  Result Value Ref Range   Cholesterol 100 0 - 200 mg/dL   Triglycerides 86 <150 mg/dL   HDL 23 (L) >40 mg/dL   Total CHOL/HDL Ratio 4.3 RATIO   VLDL 17 0 - 40 mg/dL   LDL Cholesterol 60 0 - 99 mg/dL  Basic metabolic panel  Result Value Ref Range   Sodium 135 135 - 145 mmol/L   Potassium 4.5 3.5 - 5.1 mmol/L   Chloride 103 98 - 111 mmol/L   CO2 21 (L) 22 - 32 mmol/L   Glucose, Bld 140 (H) 70 - 99 mg/dL   BUN 24 (H) 8 - 23 mg/dL   Creatinine, Ser 1.33 (H) 0.61 - 1.24 mg/dL   Calcium 9.4 8.9 - 10.3 mg/dL   GFR calc non Af Amer 55 (L) >60 mL/min   GFR calc Af Amer >60 >60 mL/min   Anion gap 11 5 - 15  ECHOCARDIOGRAM COMPLETE  Result Value Ref Range   Weight 3,473.6 oz   Height 72 in   BP 131/73 mmHg      Assessment & Plan:   Problem List Items Addressed This Visit      Cardiovascular and Mediastinum   Coronary  artery disease    Recheck lipids, continue current regimen.        Benign essential hypertension    BPs stable and under good control, continue current regimen      Relevant Orders   CBC with Differential/Platelet   UA/M w/rflx Culture, Routine     Genitourinary   BPH (benign prostatic hyperplasia)    No new sxs, recheck PSA and continue to monitor      Relevant Orders   PSA     Other   Hyperlipemia, mixed - Primary    Recheck lipids, adjust as needed. Continue current regimen      Relevant Orders   Comprehensive metabolic panel   Lipid Panel w/o Chol/HDL Ratio   History of ST elevation myocardial infarction (STEMI)    Following with Cardiology, no new anginal episodes. Continue current regimen, recheck labs and maintain good BP control. Lifestyle habits reviewed      History of stroke    Stable without long term deficits, continue current regimen       Other Visit Diagnoses    Annual physical exam           Discussed aspirin prophylaxis for myocardial infarction prevention and decision was made to continue ASA  LABORATORY TESTING:  Health maintenance labs ordered today as discussed above.   The natural history of prostate cancer and ongoing controversy regarding screening and potential treatment outcomes of prostate cancer has been discussed with the patient. The meaning of a false positive PSA and a false negative PSA has been discussed. He indicates understanding of the limitations of this screening test and wishes to proceed with screening PSA testing.   IMMUNIZATIONS:   - Tdap: Tetanus vaccination status reviewed: last tetanus booster within 10 years. - Influenza: Up to date - Pneumovax: Up to date - Prevnar: Up to date - HPV: Not applicable - Zostavax vaccine: Up to date  SCREENING: - Colonoscopy: Up to date  Discussed with patient purpose of the colonoscopy is to detect colon cancer at curable precancerous or early stages   PATIENT COUNSELING:    Sexuality: Discussed sexually transmitted diseases, partner selection,  use of condoms, avoidance of unintended pregnancy  and contraceptive alternatives.   Advised to avoid cigarette smoking.  I discussed with the patient that most people either abstain from alcohol or drink within safe limits (<=14/week and <=4 drinks/occasion for males, <=7/weeks and <= 3 drinks/occasion for females) and that the risk for alcohol disorders and other health effects rises proportionally with the number of drinks per week and how often a drinker exceeds daily limits.  Discussed cessation/primary prevention of drug use and availability of treatment for abuse.   Diet: Encouraged to adjust caloric intake to maintain  or achieve ideal body weight, to reduce intake of dietary saturated fat and total fat, to limit sodium intake by avoiding high sodium foods and not adding table salt, and to maintain adequate dietary potassium and calcium preferably from fresh fruits, vegetables, and low-fat dairy products.    stressed the importance of regular exercise  Injury prevention: Discussed safety belts, safety helmets, smoke detector, smoking near bedding or upholstery.   Dental health: Discussed importance of regular tooth brushing, flossing, and dental visits.   Follow up plan: NEXT PREVENTATIVE PHYSICAL DUE IN 1 YEAR. Return in about 6 months (around 04/15/2020) for 6 month f/u.

## 2019-10-17 NOTE — Assessment & Plan Note (Signed)
Recheck lipids, adjust as needed. Continue current regimen 

## 2019-10-17 NOTE — Assessment & Plan Note (Signed)
Stable without long term deficits, continue current regimen

## 2019-10-18 LAB — CBC WITH DIFFERENTIAL/PLATELET
Basophils Absolute: 0.1 10*3/uL (ref 0.0–0.2)
Basos: 1 %
EOS (ABSOLUTE): 0.5 10*3/uL — ABNORMAL HIGH (ref 0.0–0.4)
Eos: 7 %
Hematocrit: 46.7 % (ref 37.5–51.0)
Hemoglobin: 15.1 g/dL (ref 13.0–17.7)
Immature Grans (Abs): 0 10*3/uL (ref 0.0–0.1)
Immature Granulocytes: 0 %
Lymphocytes Absolute: 1.6 10*3/uL (ref 0.7–3.1)
Lymphs: 23 %
MCH: 29.2 pg (ref 26.6–33.0)
MCHC: 32.3 g/dL (ref 31.5–35.7)
MCV: 90 fL (ref 79–97)
Monocytes Absolute: 0.6 10*3/uL (ref 0.1–0.9)
Monocytes: 8 %
Neutrophils Absolute: 4.4 10*3/uL (ref 1.4–7.0)
Neutrophils: 61 %
Platelets: 195 10*3/uL (ref 150–450)
RBC: 5.18 x10E6/uL (ref 4.14–5.80)
RDW: 14.4 % (ref 11.6–15.4)
WBC: 7.1 10*3/uL (ref 3.4–10.8)

## 2019-10-18 LAB — COMPREHENSIVE METABOLIC PANEL
ALT: 20 IU/L (ref 0–44)
AST: 18 IU/L (ref 0–40)
Albumin/Globulin Ratio: 2.1 (ref 1.2–2.2)
Albumin: 4.4 g/dL (ref 3.8–4.8)
Alkaline Phosphatase: 71 IU/L (ref 39–117)
BUN/Creatinine Ratio: 13 (ref 10–24)
BUN: 18 mg/dL (ref 8–27)
Bilirubin Total: 0.5 mg/dL (ref 0.0–1.2)
CO2: 21 mmol/L (ref 20–29)
Calcium: 9.2 mg/dL (ref 8.6–10.2)
Chloride: 107 mmol/L — ABNORMAL HIGH (ref 96–106)
Creatinine, Ser: 1.39 mg/dL — ABNORMAL HIGH (ref 0.76–1.27)
GFR calc Af Amer: 60 mL/min/{1.73_m2} (ref >59)
GFR calc non Af Amer: 52 mL/min/{1.73_m2} — ABNORMAL LOW (ref >59)
Globulin, Total: 2.1 g/dL (ref 1.5–4.5)
Glucose: 99 mg/dL (ref 65–99)
Potassium: 4.8 mmol/L (ref 3.5–5.2)
Sodium: 142 mmol/L (ref 134–144)
Total Protein: 6.5 g/dL (ref 6.0–8.5)

## 2019-10-18 LAB — PSA: Prostate Specific Ag, Serum: 1.1 ng/mL (ref 0.0–4.0)

## 2019-10-18 LAB — LIPID PANEL W/O CHOL/HDL RATIO
Cholesterol, Total: 116 mg/dL (ref 100–199)
HDL: 37 mg/dL — ABNORMAL LOW (ref >39)
LDL Chol Calc (NIH): 64 mg/dL (ref 0–99)
Triglycerides: 72 mg/dL (ref 0–149)
VLDL Cholesterol Cal: 15 mg/dL (ref 5–40)

## 2019-10-25 ENCOUNTER — Other Ambulatory Visit: Payer: Self-pay

## 2019-10-25 DIAGNOSIS — E7800 Pure hypercholesterolemia, unspecified: Secondary | ICD-10-CM

## 2019-10-25 DIAGNOSIS — I2510 Atherosclerotic heart disease of native coronary artery without angina pectoris: Secondary | ICD-10-CM

## 2019-10-25 NOTE — Telephone Encounter (Signed)
Refill request for ezetimibe  LOV 10/17/19 Next OV 04/15/20

## 2019-10-29 MED ORDER — EZETIMIBE 10 MG PO TABS: 10.0000 mg | ORAL_TABLET | Freq: Every day | ORAL | 1 refills | Status: DC

## 2019-11-07 ENCOUNTER — Ambulatory Visit: Payer: Medicare PPO

## 2019-11-14 ENCOUNTER — Ambulatory Visit (INDEPENDENT_AMBULATORY_CARE_PROVIDER_SITE_OTHER): Payer: Medicare PPO

## 2019-11-14 VITALS — Ht 70.0 in | Wt 236.0 lb

## 2019-11-14 DIAGNOSIS — Z0000 Encounter for general adult medical examination without abnormal findings: Secondary | ICD-10-CM | POA: Diagnosis not present

## 2019-11-14 DIAGNOSIS — I10 Essential (primary) hypertension: Secondary | ICD-10-CM | POA: Diagnosis not present

## 2019-11-14 MED ORDER — AMLODIPINE BESYLATE 10 MG PO TABS: 10.0000 mg | ORAL_TABLET | Freq: Every day | ORAL | 1 refills | Status: DC

## 2019-11-14 NOTE — Patient Instructions (Signed)
[name] , Thank you for taking time to come for your Medicare Wellness Visit. I appreciate your ongoing commitment to your health goals. Please review the following plan we discussed and let me know if I can assist you in the future.   Screening recommendations/referrals: Colonoscopy: up to date Recommended yearly ophthalmology/optometry visit for glaucoma screening and checkup Recommended yearly dental visit for hygiene and checkup  Vaccinations: Influenza vaccine: up to date Pneumococcal vaccine: up to date Tdap vaccine: up to date Shingles vaccine: shingrix eligible    Advanced directives: copy on file  Conditions/risks identified: none  Next appointment: follow up in one year for your annual wellness visit   Preventive Care 31 Years and Older, Male Preventive care refers to lifestyle choices and visits with your health care provider that can promote health and wellness. What does preventive care include?  A yearly physical exam. This is also called an annual well check.  Dental exams once or twice a year.  Routine eye exams. Ask your health care provider how often you should have your eyes checked.  Personal lifestyle choices, including:  Daily care of your teeth and gums.  Regular physical activity.  Eating a healthy diet.  Avoiding tobacco and drug use.  Limiting alcohol use.  Practicing safe sex.  Taking low doses of aspirin every day.  Taking vitamin and mineral supplements as recommended by your health care provider. What happens during an annual well check? The services and screenings done by your health care provider during your annual well check will depend on your age, overall health, lifestyle risk factors, and family history of disease. Counseling  Your health care provider may ask you questions about your:  Alcohol use.  Tobacco use.  Drug use.  Emotional well-being.  Home and relationship well-being.  Sexual activity.  Eating  habits.  History of falls.  Memory and ability to understand (cognition).  Work and work Statistician. Screening  You may have the following tests or measurements:  Height, weight, and BMI.  Blood pressure.  Lipid and cholesterol levels. These may be checked every 5 years, or more frequently if you are over 62 years old.  Skin check.  Lung cancer screening. You may have this screening every year starting at age 55 if you have a 30-pack-year history of smoking and currently smoke or have quit within the past 15 years.  Fecal occult blood test (FOBT) of the stool. You may have this test every year starting at age 54.  Flexible sigmoidoscopy or colonoscopy. You may have a sigmoidoscopy every 5 years or a colonoscopy every 10 years starting at age 27.  Prostate cancer screening. Recommendations will vary depending on your family history and other risks.  Hepatitis C blood test.  Hepatitis B blood test.  Sexually transmitted disease (STD) testing.  Diabetes screening. This is done by checking your blood sugar (glucose) after you have not eaten for a while (fasting). You may have this done every 1-3 years.  Abdominal aortic aneurysm (AAA) screening. You may need this if you are a current or former smoker.  Osteoporosis. You may be screened starting at age 27 if you are at high risk. Talk with your health care provider about your test results, treatment options, and if necessary, the need for more tests. Vaccines  Your health care provider may recommend certain vaccines, such as:  Influenza vaccine. This is recommended every year.  Tetanus, diphtheria, and acellular pertussis (Tdap, Td) vaccine. You may need a Td booster  every 10 years.  Zoster vaccine. You may need this after age 47.  Pneumococcal 13-valent conjugate (PCV13) vaccine. One dose is recommended after age 67.  Pneumococcal polysaccharide (PPSV23) vaccine. One dose is recommended after age 79. Talk to your health  care provider about which screenings and vaccines you need and how often you need them. This information is not intended to replace advice given to you by your health care provider. Make sure you discuss any questions you have with your health care provider. Document Released: 10/09/2015 Document Revised: 06/01/2016 Document Reviewed: 07/14/2015 Elsevier Interactive Patient Education  2017 [employer] Prevention in the Home Falls can cause injuries. They can happen to people of all ages. There are many things you can do to make your home safe and to help prevent falls. What can I do on the outside of my home?  Regularly fix the edges of walkways and driveways and fix any cracks.  Remove anything that might make you trip as you walk through a door, such as a raised step or threshold.  Trim any bushes or trees on the path to your home.  Use bright outdoor lighting.  Clear any walking paths of anything that might make someone trip, such as rocks or tools.  Regularly check to see if handrails are loose or broken. Make sure that both sides of any steps have handrails.  Any raised decks and porches should have guardrails on the edges.  Have any leaves, snow, or ice cleared regularly.  Use sand or salt on walking paths during winter.  Clean up any spills in your garage right away. This includes oil or grease spills. What can I do in the bathroom?  Use night lights.  Install grab bars by the toilet and in the tub and shower. Do not use towel bars as grab bars.  Use non-skid mats or decals in the tub or shower.  If you need to sit down in the shower, use a plastic, non-slip stool.  Keep the floor dry. Clean up any water that spills on the floor as soon as it happens.  Remove soap buildup in the tub or shower regularly.  Attach bath mats securely with double-sided non-slip rug tape.  Do not have throw rugs and other things on the floor that can make you trip. What can I do  in the bedroom?  Use night lights.  Make sure that you have a light by your bed that is easy to reach.  Do not use any sheets or blankets that are too big for your bed. They should not hang down onto the floor.  Have a firm chair that has side arms. You can use this for support while you get dressed.  Do not have throw rugs and other things on the floor that can make you trip. What can I do in the kitchen?  Clean up any spills right away.  Avoid walking on wet floors.  Keep items that you use a lot in easy-to-reach places.  If you need to reach something above you, use a strong step stool that has a grab bar.  Keep electrical cords out of the way.  Do not use floor polish or wax that makes floors slippery. If you must use wax, use non-skid floor wax.  Do not have throw rugs and other things on the floor that can make you trip. What can I do with my stairs?  Do not leave any items on the stairs.  Make  sure that there are handrails on both sides of the stairs and use them. Fix handrails that are broken or loose. Make sure that handrails are as long as the stairways.  Check any carpeting to make sure that it is firmly attached to the stairs. Fix any carpet that is loose or worn.  Avoid having throw rugs at the top or bottom of the stairs. If you do have throw rugs, attach them to the floor with carpet tape.  Make sure that you have a light switch at the top of the stairs and the bottom of the stairs. If you do not have them, ask someone to add them for you. What else can I do to help prevent falls?  Wear shoes that:  Do not have high heels.  Have rubber bottoms.  Are comfortable and fit you well.  Are closed at the toe. Do not wear sandals.  If you use a stepladder:  Make sure that it is fully opened. Do not climb a closed stepladder.  Make sure that both sides of the stepladder are locked into place.  Ask someone to hold it for you, if possible.  Clearly mark  and make sure that you can see:  Any grab bars or handrails.  First and last steps.  Where the edge of each step is.  Use tools that help you move around (mobility aids) if they are needed. These include:  Canes.  Walkers.  Scooters.  Crutches.  Turn on the lights when you go into a dark area. Replace any light bulbs as soon as they burn out.  Set up your furniture so you have a clear path. Avoid moving your furniture around.  If any of your floors are uneven, fix them.  If there are any pets around you, be aware of where they are.  Review your medicines with your doctor. Some medicines can make you feel dizzy. This can increase your chance of falling. Ask your doctor what other things that you can do to help prevent falls. This information is not intended to replace advice given to you by your health care provider. Make sure you discuss any questions you have with your health care provider. Document Released: 07/09/2009 Document Revised: 02/18/2016 Document Reviewed: 10/17/2014 Elsevier Interactive Patient Education  2017 [employer].

## 2019-11-14 NOTE — Progress Notes (Signed)
Subjective:   OTHAR NEVILLS is a 69 y.o. male who presents for Medicare Annual/Subsequent preventive examination.  This visit is being conducted via phone call  - after an attmept to do on video chat - due to the COVID-19 pandemic. This patient has given me verbal consent via phone to conduct this visit, patient states they are participating from their home address. Some vital signs may be absent or patient reported.   Patient identification: identified by name, DOB, and current address.    Review of Systems:   Cardiac Risk Factors include: advanced age (>79men, >43 women);male gender;dyslipidemia;hypertension     Objective:    Vitals: Ht 5\' 10"  (1.778 m)   Wt 236 lb (107 kg)   BMI 33.86 kg/m   Body mass index is 33.86 kg/m.  Advanced Directives 06/18/2019 06/17/2019 06/02/2019 06/01/2019 10/31/2018 09/22/2017 11/01/2016  Does Patient Have a Medical Advance Directive? Yes Yes Yes Yes Yes No No  Type of Advance Directive Living will;Healthcare Power of Attorney Living will [place];Living will [place];Living will Living will;Healthcare Power of Attorney - -  Does patient want to make changes to medical advance directive? No - Guardian declined - No - Patient declined - - Yes (MAU/Ambulatory/Procedural Areas - Information given) -  Copy of [place] in Chart? No - copy requested - No - copy requested No - copy requested Yes - validated most recent copy scanned in chart (See row information) - -  Would patient like information on creating a medical advance directive? No - Guardian declined - - No - Patient declined - - No - Patient declined    Tobacco Social History   Tobacco Use  Smoking Status Former Smoker  . Types: Cigarettes  . Quit date: 45  . Years since quitting: 31.1  Smokeless Tobacco Former Systems developer  . Types: Snuff  . Quit date: 2019  Tobacco Comment   quit 30 years ago      Counseling given: Not  Answered Comment: quit 30 years ago    Clinical Intake:  Pre-visit preparation completed: Yes  Pain : No/denies pain     Nutritional Status: BMI > 30  Obese Nutritional Risks: None Diabetes: No  How often do you need to have someone help you when you read instructions, pamphlets, or other written materials from your doctor or pharmacy?: 1 - Never  Interpreter Needed?: No  Information entered by :: Ledarrius Beauchaine,LPN  Past Medical History:  Diagnosis Date  . Arthritis   . CAD (coronary artery disease)   . Hyperlipidemia   . Myocardial infarction [facility]) 2005   Past Surgical History:  Procedure Laterality Date  . APPENDECTOMY    . CARDIAC CATHETERIZATION    . COLONOSCOPY WITH PROPOFOL N/A 11/01/2016   Procedure: COLONOSCOPY WITH PROPOFOL;  Surgeon: Jonathon Bellows, MD;  Location: [facility] ENDOSCOPY;  Service: Endoscopy;  Laterality: N/A;  . CORONARY ANGIOPLASTY    . CORONARY ARTERY BYPASS GRAFT N/A 06/05/2019   Procedure: CORONARY ARTERY BYPASS GRAFTING (CABG) x 3 WITH ENDOSCOPIC HARVESTING OF RIGHT GREATER SAPHENOUS VEIN. LIMA TO LAD.;  Surgeon: Ivin Poot, MD;  Location: [place];  Service: Open Heart Surgery;  Laterality: N/A;  . CORONARY/GRAFT ACUTE MI REVASCULARIZATION N/A 06/01/2019   Procedure: Coronary/Graft Acute MI Revascularization;  Surgeon: Yolonda Kida, MD;  Location: [place] CV LAB;  Service: Cardiovascular;  Laterality: N/A;  . LEFT HEART CATH AND CORONARY ANGIOGRAPHY N/A 06/01/2019   Procedure: LEFT HEART CATH  AND CORONARY ANGIOGRAPHY;  Surgeon: Yolonda Kida, MD;  Location: [place] CV LAB;  Service: Cardiovascular;  Laterality: N/A;  . TEE WITHOUT CARDIOVERSION N/A 06/05/2019   Procedure: TRANSESOPHAGEAL ECHOCARDIOGRAM (TEE);  Surgeon: Prescott Gum, Collier Salina, MD;  Location: [place];  Service: Open Heart Surgery;  Laterality: N/A;   Family History  Problem Relation Age of Onset  . Lung cancer Father    Social History   Socioeconomic History  . Marital  status: Married    Spouse name: Not on file  . Number of children: Not on file  . Years of education: Not on file  . Highest education level: Some college, no degree  Occupational History  . Occupation: retired  Tobacco Use  . Smoking status: Former Smoker    Types: Cigarettes    Quit date: 1990    Years since quitting: 31.1  . Smokeless tobacco: Former Systems developer    Types: Snuff    Quit date: 2019  . Tobacco comment: quit 30 years ago   Substance and Sexual Activity  . Alcohol use: Not Currently    Comment: occasionally, no alcohol in over a year   . Drug use: No  . Sexual activity: Not on file  Other Topics Concern  . Not on file  Social History Narrative  . Not on file   Social Determinants of Health   Financial Resource Strain:   . Difficulty of Paying Living Expenses: Not on file  Food Insecurity:   . Worried About Charity fundraiser in the Last Year: Not on file  . Ran Out of Food in the Last Year: Not on file  Transportation Needs:   . Lack of Transportation (Medical): Not on file  . Lack of Transportation (Non-Medical): Not on file  Physical Activity:   . Days of Exercise per Week: Not on file  . Minutes of Exercise per Session: Not on file  Stress:   . Feeling of Stress : Not on file  Social Connections:   . Frequency of Communication with Friends and Family: Not on file  . Frequency of Social Gatherings with Friends and Family: Not on file  . Attends Religious Services: Not on file  . Active Member of Clubs or Organizations: Not on file  . Attends Archivist Meetings: Not on file  . Marital Status: Not on file    Outpatient Encounter Medications as of 11/14/2019  Medication Sig  . amLODipine (NORVASC) 10 MG tablet Take 1 tablet (10 mg total) by mouth daily.  Marland Kitchen aspirin (ASPIRIN EC) 81 MG EC tablet Take 81 mg by mouth daily. Swallow whole.  Marland Kitchen atorvastatin (LIPITOR) 80 MG tablet Take 1 tablet (80 mg total) by mouth daily at 6 PM.  . carvedilol (COREG)  12.5 MG tablet Take 1 tablet (12.5 mg total) by mouth 2 (two) times daily with a meal.  . clopidogrel (PLAVIX) 75 MG tablet Take 1 tablet (75 mg total) by mouth daily.  Marland Kitchen ezetimibe (ZETIA) 10 MG tablet Take 1 tablet (10 mg total) by mouth daily.  Marland Kitchen FIBER PO Take 2 tablets by mouth 2 (two) times daily.  . Lactobacillus (PROBIOTIC ACIDOPHILUS PO) Take 1 capsule by mouth at bedtime.   . meloxicam (MOBIC) 7.5 MG tablet Take by mouth daily.   . [DISCONTINUED] amLODipine (NORVASC) 10 MG tablet Take 1 tablet (10 mg total) by mouth daily.   No facility-administered encounter medications on file as of 11/14/2019.    Activities of Daily Living In your  present state of health, do you have any difficulty performing the following activities: 11/14/2019 06/18/2019  Hearing? Y N  Comment left ear. no hearing aids -  Vision? N N  Comment eyeglasses, Dr.Woodard -  Difficulty concentrating or making decisions? Y N  Comment some -  Walking or climbing stairs? N Y  Dressing or bathing? N N  Doing errands, shopping? N Y  Conservation officer, nature and eating ? N -  Using the Toilet? N -  In the past six months, have you accidently leaked urine? N -  Do you have problems with loss of bowel control? N -  Managing your Medications? N -  Managing your Finances? N -  Housekeeping or managing your Housekeeping? N -  Some recent data might be hidden    Patient Care Team: Volney American, PA-C as PCP - General (Family Medicine) Belva Crome, MD as PCP - Cardiology (Cardiology)   Assessment:   This is a routine wellness examination for Zerin.  Exercise Activities and Dietary recommendations Current Exercise Habits: Home exercise routine, Type of exercise: walking, Time (Minutes): 30(2 miles a day), Frequency (Times/Week): 7, Weekly Exercise (Minutes/Week): 210, Intensity: Mild, Exercise limited by: None identified  Goals    . DIET - INCREASE WATER INTAKE     Recommend drinking at least 6-8 glasses of water a  day        Fall Risk: Fall Risk  11/14/2019 10/31/2018 10/15/2018 04/05/2018 10/05/2017  Falls in the past year? 0 0 0 No No  Number falls in past yr: 0 0 - - -  Injury with Fall? 0 - - - -  Follow up - - Falls evaluation completed - -    FALL RISK PREVENTION PERTAINING TO THE HOME:  Any stairs in or around the home? Yes front steps, basement steps  If so, are there any without handrails? No   Home free of loose throw rugs in walkways, pet beds, electrical cords, etc? Yes  Adequate lighting in your home to reduce risk of falls? Yes   ASSISTIVE DEVICES UTILIZED TO PREVENT FALLS:  Life alert? No  Use of a cane, walker or w/c? No  Grab bars in the bathroom? No  Shower chair or bench in shower? No  Elevated toilet seat or a handicapped toilet? No   TIMED UP AND GO:  Unable to perform   Depression Screen PHQ 2/9 Scores 11/14/2019 10/31/2018 10/15/2018 04/05/2018  PHQ - 2 Score 0 0 0 0  PHQ- 9 Score - - 0 -    Cognitive Function     6CIT Screen 10/31/2018 09/22/2017  What Year? 0 points 0 points  What month? 0 points 0 points  What time? 0 points 0 points  Count back from 20 0 points 0 points  Months in reverse 0 points 0 points  Repeat phrase 0 points 0 points  Total Score 0 0    Immunization History  Administered Date(s) Administered  . Influenza, High Dose Seasonal PF 09/07/2016, 09/22/2017, 07/06/2019  . Influenza,inj,Quad PF,6+ Mos 08/31/2015  . Influenza-Unspecified 07/03/2018  . Pneumococcal Conjugate-13 10/15/2018  . Pneumococcal Polysaccharide-23 04/04/2017  . Pneumococcal-Unspecified 09/26/2005  . Tdap 08/26/2014  . Zoster 08/21/2013    Qualifies for Shingles Vaccine? Yes  Zostavax completed 2014. Due for Shingrix. Education has been provided regarding the importance of this vaccine. Pt has been advised to call insurance company to determine out of pocket expense. Advised may also receive vaccine at local pharmacy or Health Dept. Verbalized acceptance  and  understanding.  Tdap: up to date.  Flu Vaccine: up to date  Pneumococcal Vaccine: up to date   Covid-19: scheduled for 11/16/2019 for first dose   Screening Tests Health Maintenance  Topic Date Due  . COLONOSCOPY  11/01/2021  . TETANUS/TDAP  08/26/2024  . INFLUENZA VACCINE  Completed  . Hepatitis C Screening  Completed  . PNA vac Low Risk Adult  Completed   Cancer Screenings:  Colorectal Screening: Completed 11/01/2016. Repeat every 5 years  Lung Cancer Screening: (Low Dose CT Chest recommended if Age 43-80 years, 30 pack-year currently smoking OR have quit w/in 15years.) does not qualify.    Additional Screening:  Hepatitis C Screening: does qualify; Completed 2016  Vision Screening: Recommended annual ophthalmology exams for early detection of glaucoma and other disorders of the eye. Is the patient up to date with their annual eye exam?  Yes  Who is the provider or what is the name of the office in which the pt attends annual eye exams? Dr.Woodard    Dental Screening: Recommended annual dental exams for proper oral hygiene  Community Resource Referral:  CRR required this visit?  No        Plan:  I have personally reviewed and addressed the Medicare Annual Wellness questionnaire and have noted the following in the patient's chart:  A. Medical and social history B. Use of alcohol, tobacco or illicit drugs  C. Current medications and supplements D. Functional ability and status E.  Nutritional status F.  Physical activity G. Advance directives H. List of other physicians I.  Hospitalizations, surgeries, and ER visits in previous 12 months J.  [place] such as hearing and vision if needed, cognitive and depression L. Referrals and appointments   In addition, I have reviewed and discussed with patient certain preventive protocols, quality metrics, and best practice recommendations. A written personalized care plan for preventive services as well as  general preventive health recommendations were provided to patient.   Signed,   Bevelyn Ngo, LPN  [id number] Nurse Health Advisor   Nurse Notes: request for amlodipine refill, has been out since Tuesday. Called pharmacy and hasn't heard back. Spoke with Wynona Dove who gave verbal order for 6 month supply  (90 days with 1 refill). Sent to pharmacy.

## 2020-01-24 ENCOUNTER — Other Ambulatory Visit: Payer: Self-pay | Admitting: Family Medicine

## 2020-01-24 DIAGNOSIS — E7800 Pure hypercholesterolemia, unspecified: Secondary | ICD-10-CM

## 2020-01-24 DIAGNOSIS — I2510 Atherosclerotic heart disease of native coronary artery without angina pectoris: Secondary | ICD-10-CM

## 2020-02-14 ENCOUNTER — Other Ambulatory Visit: Payer: Self-pay | Admitting: Family Medicine

## 2020-02-14 DIAGNOSIS — I10 Essential (primary) hypertension: Secondary | ICD-10-CM

## 2020-04-15 ENCOUNTER — Ambulatory Visit (INDEPENDENT_AMBULATORY_CARE_PROVIDER_SITE_OTHER): Payer: Medicare PPO | Admitting: Family Medicine

## 2020-04-15 ENCOUNTER — Other Ambulatory Visit: Payer: Self-pay

## 2020-04-15 ENCOUNTER — Encounter: Payer: Self-pay | Admitting: Family Medicine

## 2020-04-15 VITALS — BP 154/84 | HR 78 | Temp 98.1°F | Wt 251.0 lb

## 2020-04-15 DIAGNOSIS — I2510 Atherosclerotic heart disease of native coronary artery without angina pectoris: Secondary | ICD-10-CM | POA: Diagnosis not present

## 2020-04-15 DIAGNOSIS — Z8673 Personal history of transient ischemic attack (TIA), and cerebral infarction without residual deficits: Secondary | ICD-10-CM | POA: Diagnosis not present

## 2020-04-15 DIAGNOSIS — I10 Essential (primary) hypertension: Secondary | ICD-10-CM | POA: Diagnosis not present

## 2020-04-15 DIAGNOSIS — I252 Old myocardial infarction: Secondary | ICD-10-CM | POA: Diagnosis not present

## 2020-04-15 DIAGNOSIS — E7800 Pure hypercholesterolemia, unspecified: Secondary | ICD-10-CM

## 2020-04-15 DIAGNOSIS — E782 Mixed hyperlipidemia: Secondary | ICD-10-CM

## 2020-04-15 DIAGNOSIS — N400 Enlarged prostate without lower urinary tract symptoms: Secondary | ICD-10-CM

## 2020-04-15 NOTE — Progress Notes (Signed)
BP (!) 154/84   Pulse 78   Temp 98.1 F (36.7 C) (Oral)   Wt 251 lb (113.9 kg)   SpO2 98%   BMI 36.01 kg/m    Subjective:    Patient ID: Andre Gallegos, male    DOB: [birth year], 69 y.o.   MRN: [id number]  HPI: Andre Gallegos is a 69 y.o. male  Chief Complaint  Patient presents with  . Hypertension  . Hyperlipidemia   Patient presenting for 6 month f/u chronic conditions.   HTN - Home BPs running around 150/80s range. States this is about where it always is, Cardiology happy with it and has not requested change of regimen with these readings. Denies CP, SOB, HAs, dizziness. Does not follow strict diet, tries to stay active but no rigorous exercise.   HLD, CAD, Hx of STEMI and CVA - on aspirin, plavix, high dose lipitor and zetia. Tolerating medications well without side effects. Denies CP, SOB, claudication, myalgias.   Relevant past medical, surgical, family and social history reviewed and updated as indicated. Interim medical history since our last visit reviewed. Allergies and medications reviewed and updated.  Review of Systems  Per HPI unless specifically indicated above     Objective:    BP (!) 154/84   Pulse 78   Temp 98.1 F (36.7 C) (Oral)   Wt 251 lb (113.9 kg)   SpO2 98%   BMI 36.01 kg/m   Wt Readings from Last 3 Encounters:  04/15/20 251 lb (113.9 kg)  11/14/19 236 lb (107 kg)  10/17/19 236 lb (107 kg)    Physical Exam Vitals and nursing note reviewed.  Constitutional:      Appearance: Normal appearance.  HENT:     Head: Atraumatic.  Eyes:     Extraocular Movements: Extraocular movements intact.     Conjunctiva/sclera: Conjunctivae normal.  Cardiovascular:     Rate and Rhythm: Normal rate and regular rhythm.  Pulmonary:     Effort: Pulmonary effort is normal.     Breath sounds: Normal breath sounds.  Musculoskeletal:        General: Normal range of motion.     Cervical back: Normal range of motion and neck supple.  Skin:     General: Skin is warm and dry.  Neurological:     General: No focal deficit present.     Mental Status: He is oriented to person, place, and time.  Psychiatric:        Mood and Affect: Mood normal.        Thought Content: Thought content normal.        Judgment: Judgment normal.     Results for orders placed or performed in visit on 04/15/20  Comprehensive metabolic panel  Result Value Ref Range   Glucose 114 (H) 65 - 99 mg/dL   BUN 19 8 - 27 mg/dL   Creatinine, Ser 1.27 0.76 - 1.27 mg/dL   GFR calc non Af Amer 58 (L) >59 mL/min/1.73   GFR calc Af Amer 67 >59 mL/min/1.73   BUN/Creatinine Ratio 15 10 - 24   Sodium 143 134 - 144 mmol/L   Potassium 5.0 3.5 - 5.2 mmol/L   Chloride 108 (H) 96 - 106 mmol/L   CO2 22 20 - 29 mmol/L   Calcium 9.3 8.6 - 10.2 mg/dL   Total Protein 6.6 6.0 - 8.5 g/dL   Albumin 4.5 3.8 - 4.8 g/dL   Globulin, Total 2.1 1.5 - 4.5 g/dL   Albumin/Globulin  Ratio 2.1 1.2 - 2.2   Bilirubin Total 0.5 0.0 - 1.2 mg/dL   Alkaline Phosphatase 75 48 - 121 IU/L   AST 25 0 - 40 IU/L   ALT 34 0 - 44 IU/L  Lipid Panel w/o Chol/HDL Ratio  Result Value Ref Range   Cholesterol, Total 123 100 - 199 mg/dL   Triglycerides 104 0 - 149 mg/dL   HDL 35 (L) >39 mg/dL   VLDL Cholesterol Cal 19 5 - 40 mg/dL   LDL Chol Calc (NIH) 69 0 - 99 mg/dL      Assessment & Plan:   Problem List Items Addressed This Visit      Cardiovascular and Mediastinum   Coronary artery disease    Recheck lipids, adjust as needed. Continue present medications and lifestyle modifications      Relevant Medications   amLODipine (NORVASC) 10 MG tablet   carvedilol (COREG) 12.5 MG tablet   atorvastatin (LIPITOR) 80 MG tablet   ezetimibe (ZETIA) 10 MG tablet   Other Relevant Orders   Lipid Panel w/o Chol/HDL Ratio (Completed)   Benign essential hypertension - Primary    BPs mildly above goal but consistently in this range, Cardiology not recommending therapy change. Continue per their  recommendations      Relevant Medications   amLODipine (NORVASC) 10 MG tablet   carvedilol (COREG) 12.5 MG tablet   atorvastatin (LIPITOR) 80 MG tablet   ezetimibe (ZETIA) 10 MG tablet   Other Relevant Orders   Comprehensive metabolic panel (Completed)     Genitourinary   BPH (benign prostatic hyperplasia)    Stable, asymptomatic, continue to monitor         Other   Hyperlipemia, mixed    Recheck lipids, adjust as needed. Continue present medications and lifestyle modifications      Relevant Medications   amLODipine (NORVASC) 10 MG tablet   carvedilol (COREG) 12.5 MG tablet   atorvastatin (LIPITOR) 80 MG tablet   ezetimibe (ZETIA) 10 MG tablet   Other Relevant Orders   Lipid Panel w/o Chol/HDL Ratio (Completed)   History of ST elevation myocardial infarction (STEMI)    Following with Cardiology, no recent events noted. Recheck labs, continue present medications for prevention      History of stroke    Stable without recent neurologic events. Continue current medications and lifestyle modifications for prevention of future events       Other Visit Diagnoses    Essential hypertension       Relevant Medications   amLODipine (NORVASC) 10 MG tablet   carvedilol (COREG) 12.5 MG tablet   atorvastatin (LIPITOR) 80 MG tablet   ezetimibe (ZETIA) 10 MG tablet   Pure hypercholesterolemia       Relevant Medications   amLODipine (NORVASC) 10 MG tablet   carvedilol (COREG) 12.5 MG tablet   atorvastatin (LIPITOR) 80 MG tablet   ezetimibe (ZETIA) 10 MG tablet       Follow up plan: Return in about 6 months (around 10/16/2020) for CPE.

## 2020-04-16 LAB — COMPREHENSIVE METABOLIC PANEL
ALT: 34 IU/L (ref 0–44)
AST: 25 IU/L (ref 0–40)
Albumin/Globulin Ratio: 2.1 (ref 1.2–2.2)
Albumin: 4.5 g/dL (ref 3.8–4.8)
Alkaline Phosphatase: 75 IU/L (ref 48–121)
BUN/Creatinine Ratio: 15 (ref 10–24)
BUN: 19 mg/dL (ref 8–27)
Bilirubin Total: 0.5 mg/dL (ref 0.0–1.2)
CO2: 22 mmol/L (ref 20–29)
Calcium: 9.3 mg/dL (ref 8.6–10.2)
Chloride: 108 mmol/L — ABNORMAL HIGH (ref 96–106)
Creatinine, Ser: 1.27 mg/dL (ref 0.76–1.27)
GFR calc Af Amer: 67 mL/min/{1.73_m2} (ref >59)
GFR calc non Af Amer: 58 mL/min/{1.73_m2} — ABNORMAL LOW (ref >59)
Globulin, Total: 2.1 g/dL (ref 1.5–4.5)
Glucose: 114 mg/dL — ABNORMAL HIGH (ref 65–99)
Potassium: 5 mmol/L (ref 3.5–5.2)
Sodium: 143 mmol/L (ref 134–144)
Total Protein: 6.6 g/dL (ref 6.0–8.5)

## 2020-04-16 LAB — LIPID PANEL W/O CHOL/HDL RATIO
Cholesterol, Total: 123 mg/dL (ref 100–199)
HDL: 35 mg/dL — ABNORMAL LOW (ref >39)
LDL Chol Calc (NIH): 69 mg/dL (ref 0–99)
Triglycerides: 104 mg/dL (ref 0–149)
VLDL Cholesterol Cal: 19 mg/dL (ref 5–40)

## 2020-04-20 MED ORDER — CARVEDILOL 12.5 MG PO TABS: 12.5000 mg | ORAL_TABLET | Freq: Two times a day (BID) | ORAL | 1 refills | Status: DC

## 2020-04-20 MED ORDER — ATORVASTATIN CALCIUM 80 MG PO TABS: 80.0000 mg | ORAL_TABLET | Freq: Every day | ORAL | 1 refills | Status: AC

## 2020-04-20 MED ORDER — AMLODIPINE BESYLATE 10 MG PO TABS: ORAL_TABLET | ORAL | 1 refills | Status: AC

## 2020-04-20 MED ORDER — EZETIMIBE 10 MG PO TABS: 10.0000 mg | ORAL_TABLET | Freq: Every day | ORAL | 1 refills | Status: DC

## 2020-04-20 NOTE — Assessment & Plan Note (Signed)
Recheck lipids, adjust as needed. Continue present medications and lifestyle modifications 

## 2020-04-20 NOTE — Assessment & Plan Note (Addendum)
Stable, asymptomatic, continue to monitor

## 2020-04-20 NOTE — Assessment & Plan Note (Signed)
Following with Cardiology, no recent events noted. Recheck labs, continue present medications for prevention

## 2020-04-20 NOTE — Assessment & Plan Note (Signed)
BPs mildly above goal but consistently in this range, Cardiology not recommending therapy change. Continue per their recommendations

## 2020-04-20 NOTE — Assessment & Plan Note (Signed)
Stable without recent neurologic events. Continue current medications and lifestyle modifications for prevention of future events

## 2020-04-23 ENCOUNTER — Other Ambulatory Visit: Payer: Self-pay | Admitting: Family Medicine

## 2020-04-23 DIAGNOSIS — R7309 Other abnormal glucose: Secondary | ICD-10-CM

## 2020-05-12 ENCOUNTER — Encounter: Payer: Self-pay | Admitting: Family Medicine

## 2020-10-16 ENCOUNTER — Ambulatory Visit: Payer: Medicare PPO | Admitting: Family Medicine

## 2020-10-19 ENCOUNTER — Ambulatory Visit: Payer: Self-pay

## 2020-10-21 ENCOUNTER — Encounter: Payer: Medicare PPO | Admitting: Family Medicine

## 2020-10-26 ENCOUNTER — Encounter: Payer: Medicare PPO | Admitting: Nurse Practitioner

## 2020-12-21 ENCOUNTER — Telehealth: Payer: Self-pay

## 2020-12-21 NOTE — Telephone Encounter (Signed)
[employer] medical request for dates of 09/26/2018-09/26/2019 release [id number]  has ben sent via interface on 12/21/2020

## 2021-01-21 ENCOUNTER — Other Ambulatory Visit: Payer: Self-pay | Admitting: Family Medicine

## 2021-01-21 DIAGNOSIS — E7800 Pure hypercholesterolemia, unspecified: Secondary | ICD-10-CM

## 2021-01-21 DIAGNOSIS — I2510 Atherosclerotic heart disease of native coronary artery without angina pectoris: Secondary | ICD-10-CM

## 2021-01-21 NOTE — Telephone Encounter (Signed)
Requested Prescriptions  Pending Prescriptions Disp Refills  . ezetimibe (ZETIA) 10 MG tablet [Pharmacy Med Name: EZETIMIBE 10 MG TAB] 90 tablet 1    Sig: TAKE 1 TABLET BY MOUTH DAILY     Cardiovascular:  Antilipid - Sterol Transport Inhibitors Failed - 01/21/2021 12:59 PM      Failed - HDL in normal range and within 360 days    HDL  Date Value Ref Range Status  04/15/2020 35 (L) >39 mg/dL Final         Passed - Total Cholesterol in normal range and within 360 days    Cholesterol, Total  Date Value Ref Range Status  04/15/2020 123 100 - 199 mg/dL Final   Cholesterol Piccolo, Waived  Date Value Ref Range Status  04/19/2019 102 <200 mg/dL Final    Comment:                            Desirable                <200                         Borderline High      200- 239                         High                     >239          Passed - LDL in normal range and within 360 days    LDL Chol Calc (NIH)  Date Value Ref Range Status  04/15/2020 69 0 - 99 mg/dL Final         Passed - Triglycerides in normal range and within 360 days    Triglycerides  Date Value Ref Range Status  04/15/2020 104 0 - 149 mg/dL Final   Triglycerides Piccolo,Waived  Date Value Ref Range Status  04/19/2019 65 <150 mg/dL Final    Comment:                            Normal                   <150                         Borderline High     150 - 199                         High                200 - 499                         Very High                >499          Passed - Valid encounter within last 12 months    Recent Outpatient Visits          9 months ago Benign essential hypertension   [facility] Volney American, [place]   1 year ago Hyperlipemia, mixed   [place], [place], [place]   1 year ago Acute ST elevation myocardial infarction (STEMI) of  inferior wall [facility])   [facility] [place] Jeananne Rama, Jeannette How, MD   1 year ago Benign essential  hypertension   [place], Jeannette How, MD   1 year ago Benign essential hypertension   [facility] [place], Jeannette How, MD

## 2021-02-06 ENCOUNTER — Other Ambulatory Visit: Payer: Self-pay | Admitting: Family Medicine

## 2021-02-06 DIAGNOSIS — I10 Essential (primary) hypertension: Secondary | ICD-10-CM

## 2021-02-10 ENCOUNTER — Other Ambulatory Visit: Payer: Self-pay | Admitting: Family Medicine

## 2021-04-22 IMAGING — DX DG CHEST 1V PORT
1 series · 2 of 2 positions shown · non-contrast
Comparison: None.

CLINICAL DATA: Acute onset of chest pain radiating to the left
shoulder and arm.

EXAM:
PORTABLE CHEST 1 VIEW

[Series 1: chest ap · 0.14mm/px · 2 of 2 slices shown]
[im 1/2]
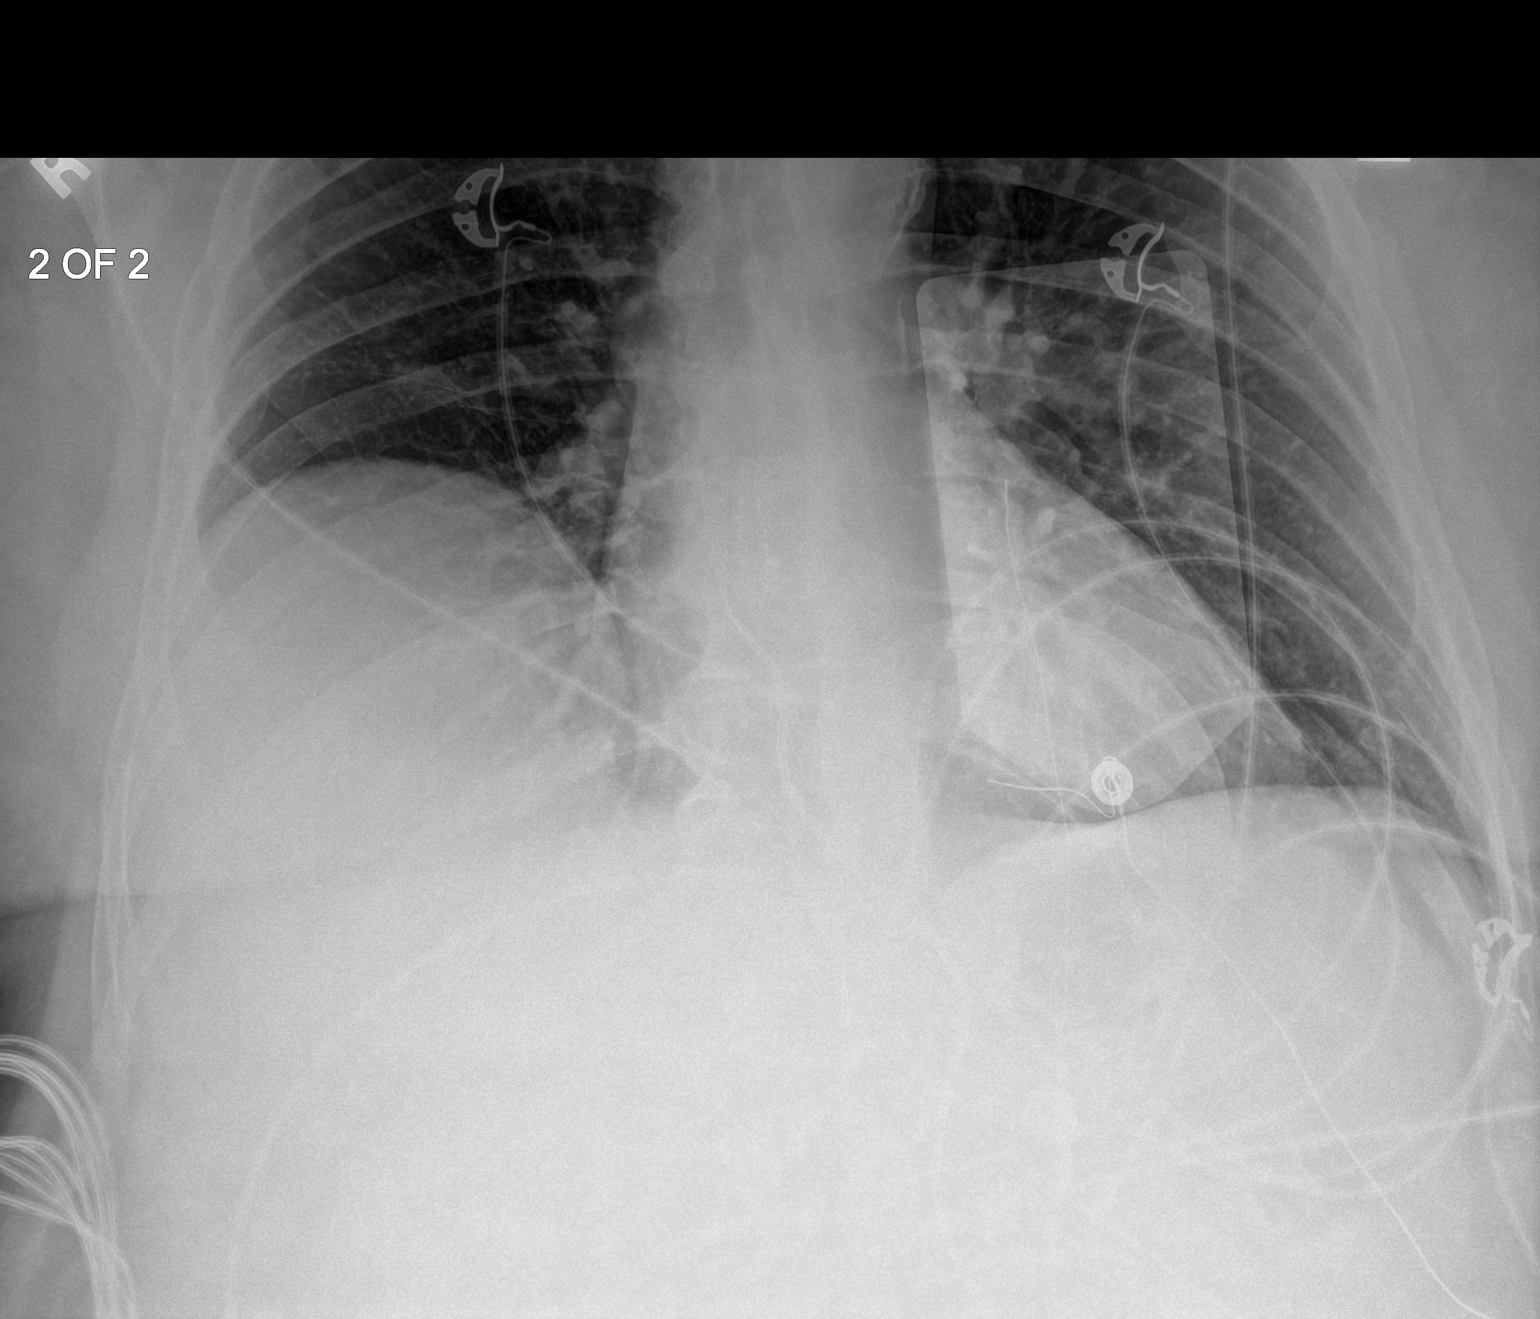
[im 2/2]
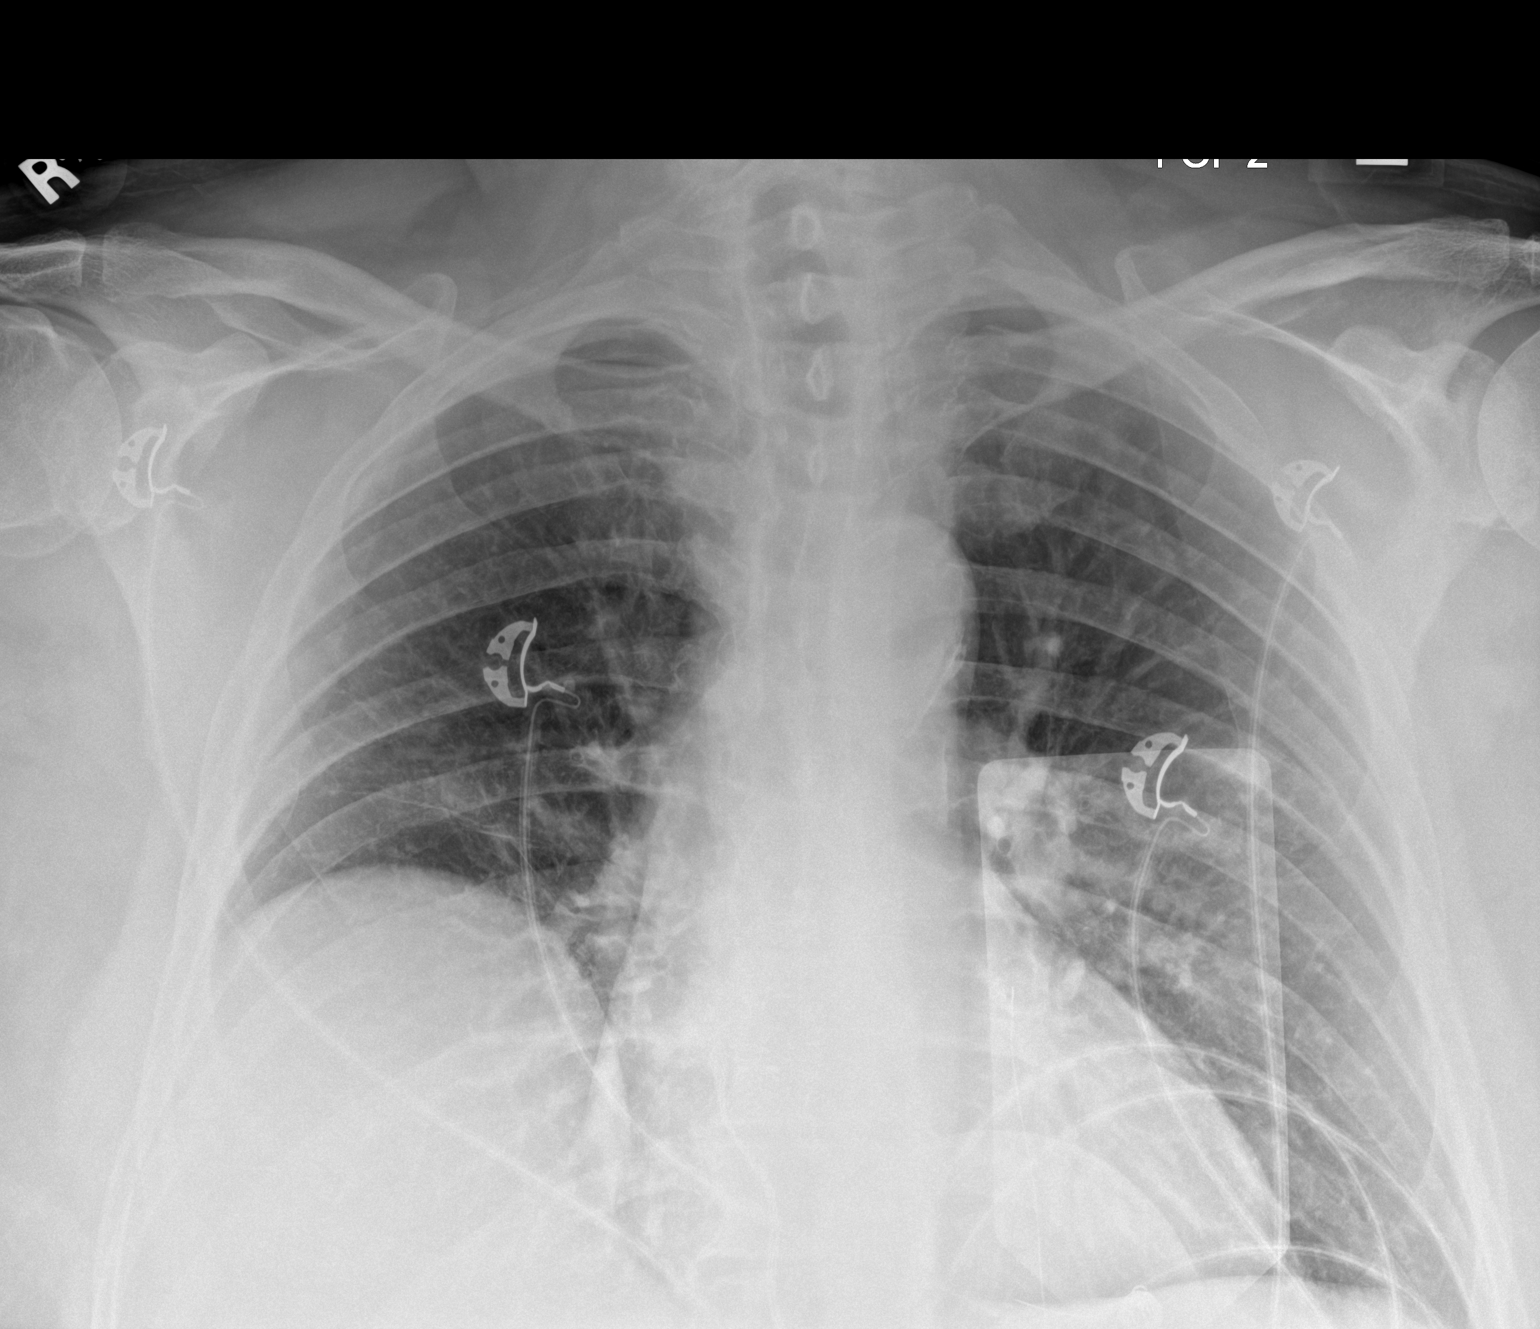

[2 of 2 positions shown; findings below may reference images not displayed]

FINDINGS: Borderline enlarged cardiac silhouette. Atherosclerotic plaque
within the thoracic aorta. Pacer pads overlie the left ventricular
apex. There is mild elevation/eventration of the right
hemidiaphragm. Mild diffuse nodular thickening the pulmonary
interstitium, potentially accentuated due to portable technique. No
discrete focal airspace opacities. No pleural effusion or
pneumothorax. No evidence of edema. No acute osseus abnormalities.
IMPRESSION: No acute cardiopulmonary disease on this AP portable examination.

## 2021-04-24 IMAGING — DX DG CHEST 1V PORT
1 series · 1 of 1 positions shown · non-contrast
Comparison: 06/01/2019

CLINICAL DATA: Pt complained of recent chest pain - due to have
open heart surgery this week

EXAM:
PORTABLE CHEST 1 VIEW

[chest ap]
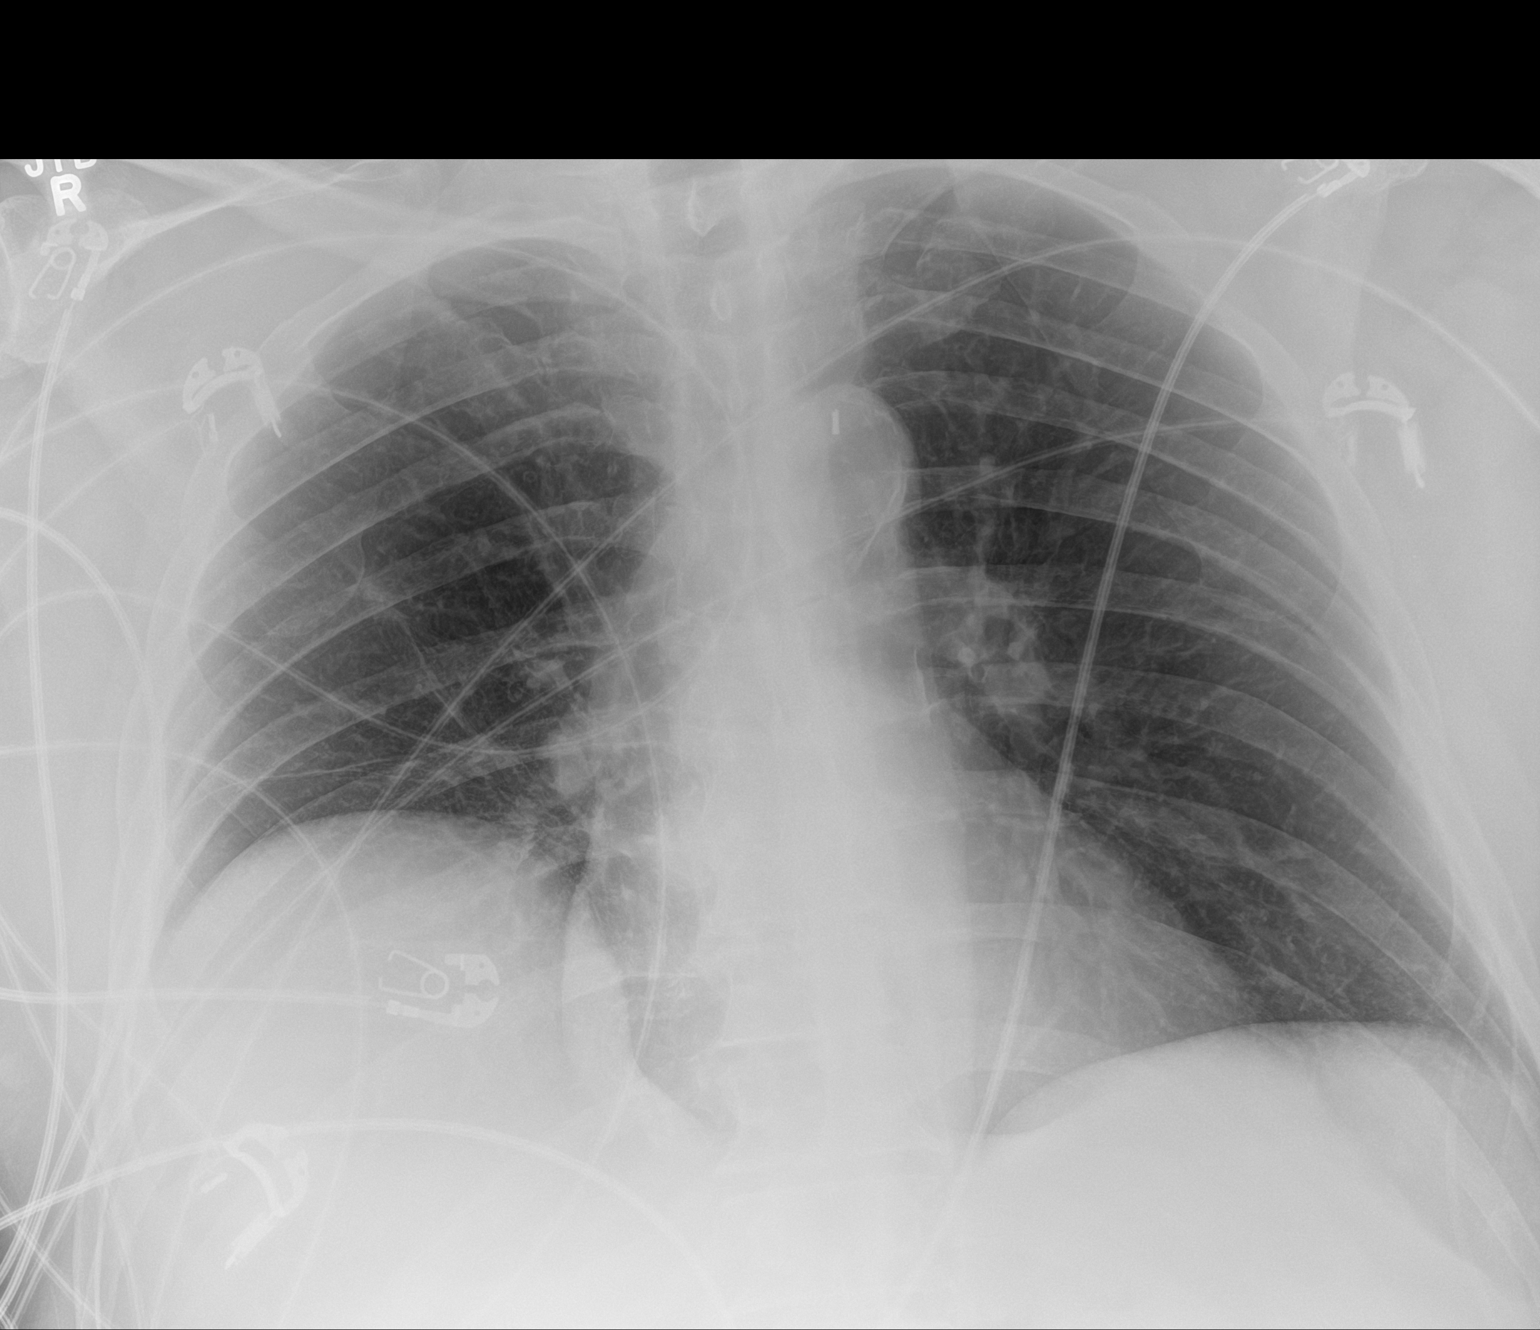

[1 of 1 positions shown; findings below may reference images not displayed]

FINDINGS: Heart size is normal. Stable elevation of the RIGHT hemidiaphragm.
Intra-aortic balloon pump marker projects over the superior aortic
arch. Improved aeration of the lungs. No pulmonary edema. No new
consolidation.
IMPRESSION: Improved aeration.

Interval placement of intra aortic balloon pump.

## 2021-04-27 IMAGING — DX DG CHEST 1V PORT
1 series · 1 of 1 positions shown · non-contrast
Comparison: 06/05/2019

CLINICAL DATA: Follow-up chest tube

EXAM:
PORTABLE CHEST 1 VIEW

[chest ap]
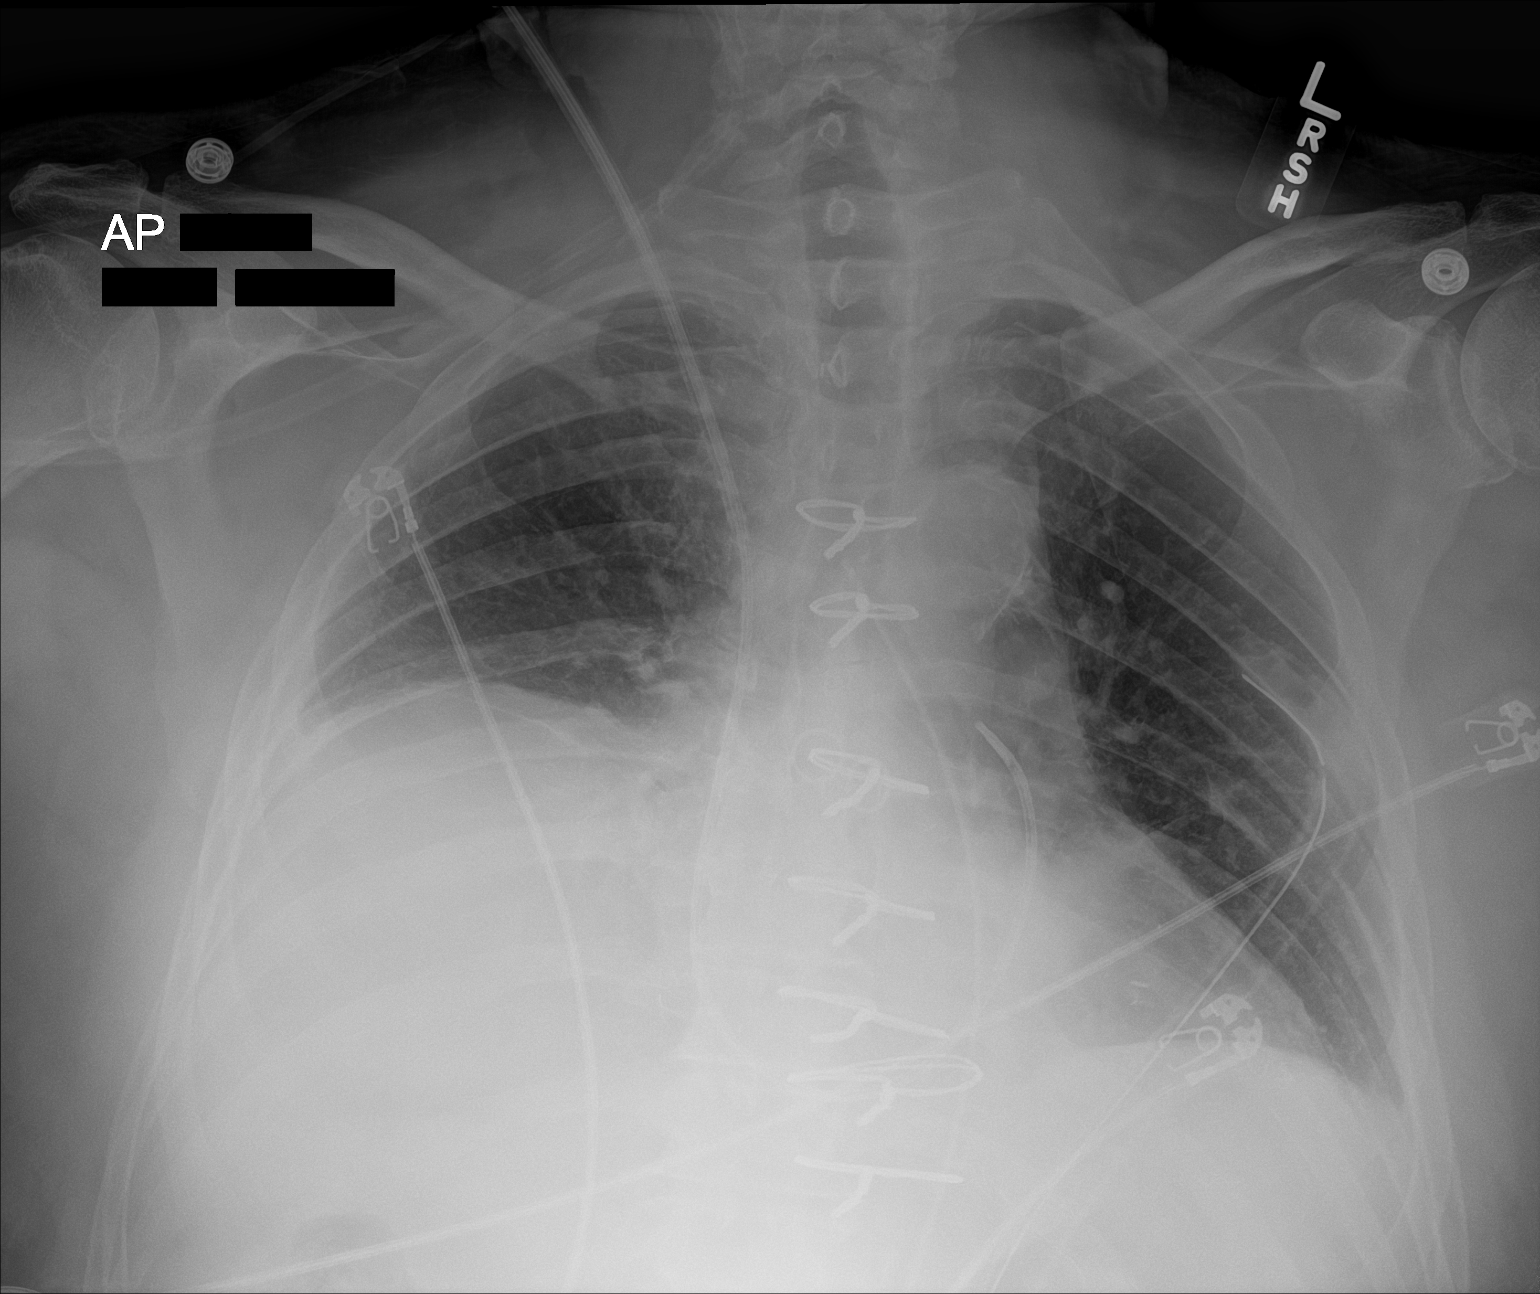

[1 of 1 positions shown; findings below may reference images not displayed]

FINDINGS: Cardiac shadow is mildly enlarged. Swan-Ganz catheter mediastinal
drain and left thoracostomy catheter are again seen. Endotracheal
tube and gastric catheter have been removed in the interval. No
pneumothorax is seen. Elevation of the right hemidiaphragm with
small right effusion is again noted. No other focal infiltrate is
seen.
IMPRESSION: Small right-sided effusion with elevation of the hemidiaphragm.

Tubes and lines as described.

## 2021-04-28 IMAGING — DX DG CHEST 1V PORT
1 series · 1 of 1 positions shown · non-contrast
Comparison: Portable exam 7711 hours compared to 06/06/2019

CLINICAL DATA: Chest tube post CABG, chest soreness

EXAM:
PORTABLE CHEST 1 VIEW

[chest ap]
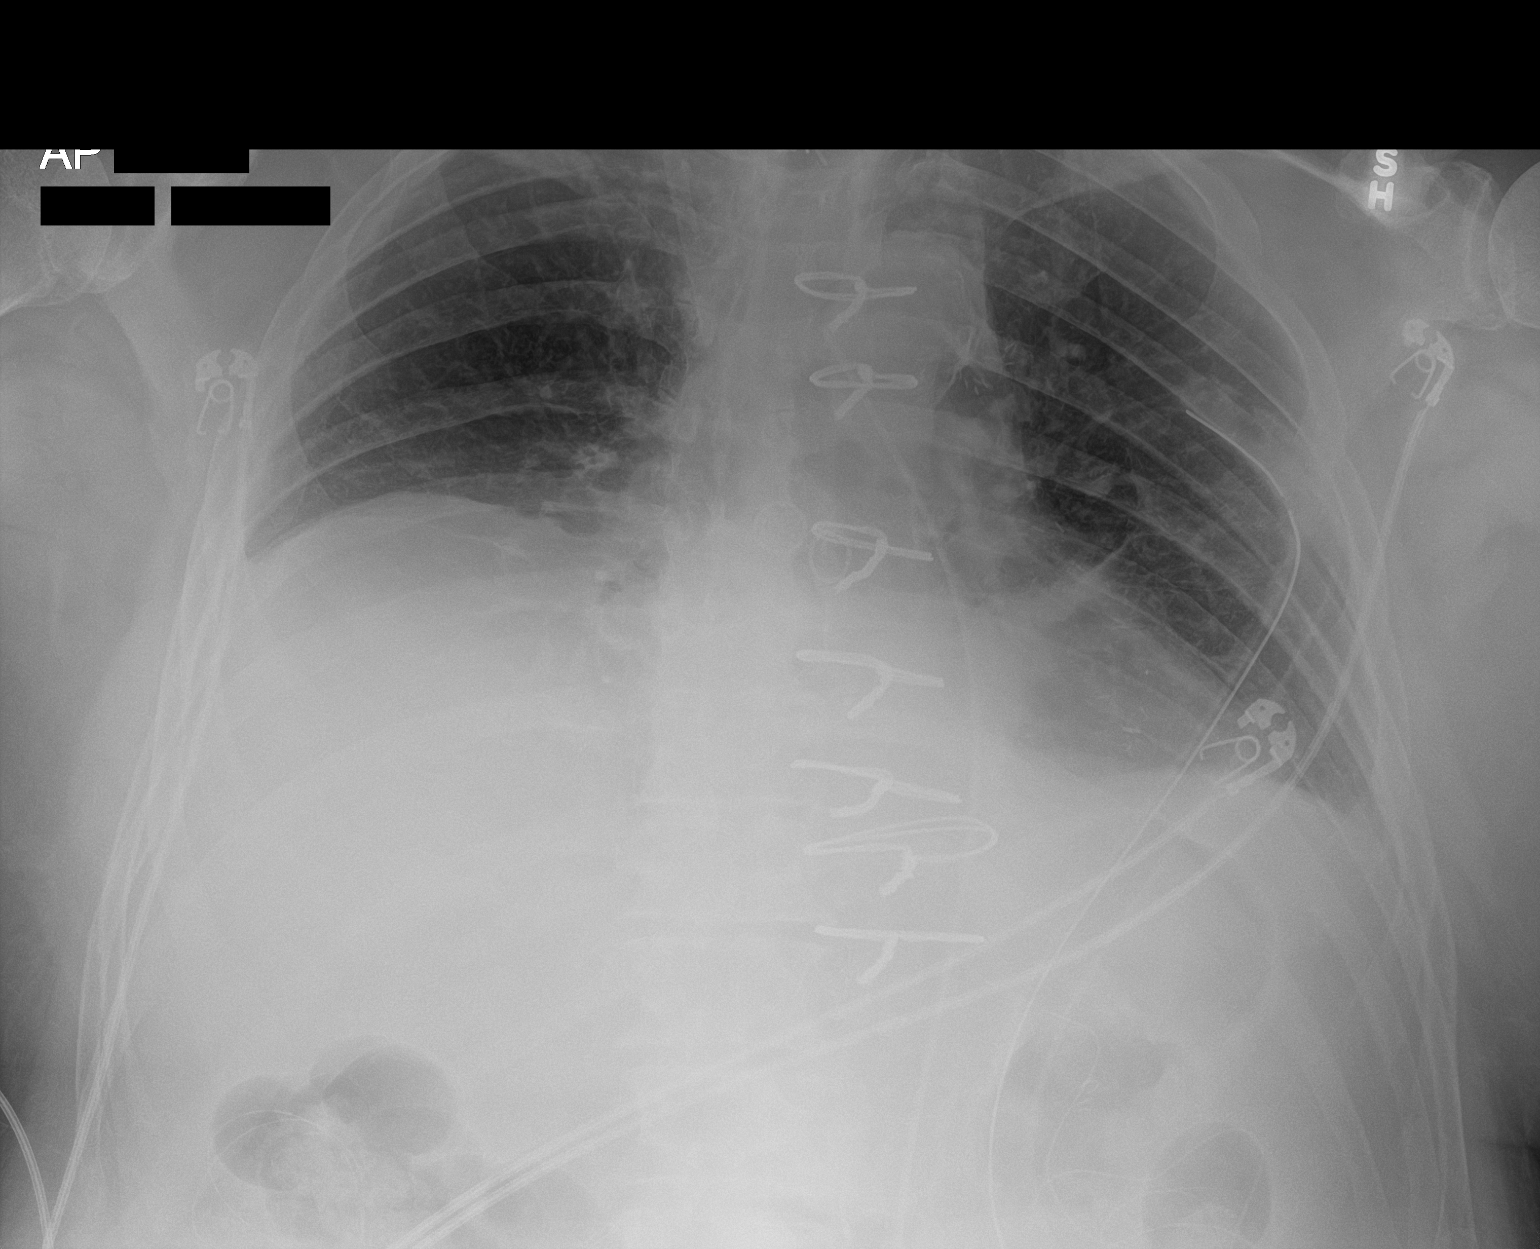

[1 of 1 positions shown; findings below may reference images not displayed]

FINDINGS: Interval removal of Swan-Ganz catheter.

RIGHT jugular catheter, mediastinal drain, and LEFT thoracostomy
tube remain.

Postsurgical changes of CABG.

Elevation of RIGHT diaphragm with persistent bibasilar atelectasis.

No acute infiltrate, gross pleural effusion, or pneumothorax.
IMPRESSION: Persistent bibasilar atelectasis.

## 2021-04-28 IMAGING — DX DG CHEST 1V PORT
1 series · 1 of 1 positions shown · non-contrast
Comparison: Radiograph earlier today at [DATE] a.m.

CLINICAL DATA: Cough and shortness of breath. Chest tube removed
earlier today.

EXAM:
PORTABLE CHEST 1 VIEW

[chest ap]
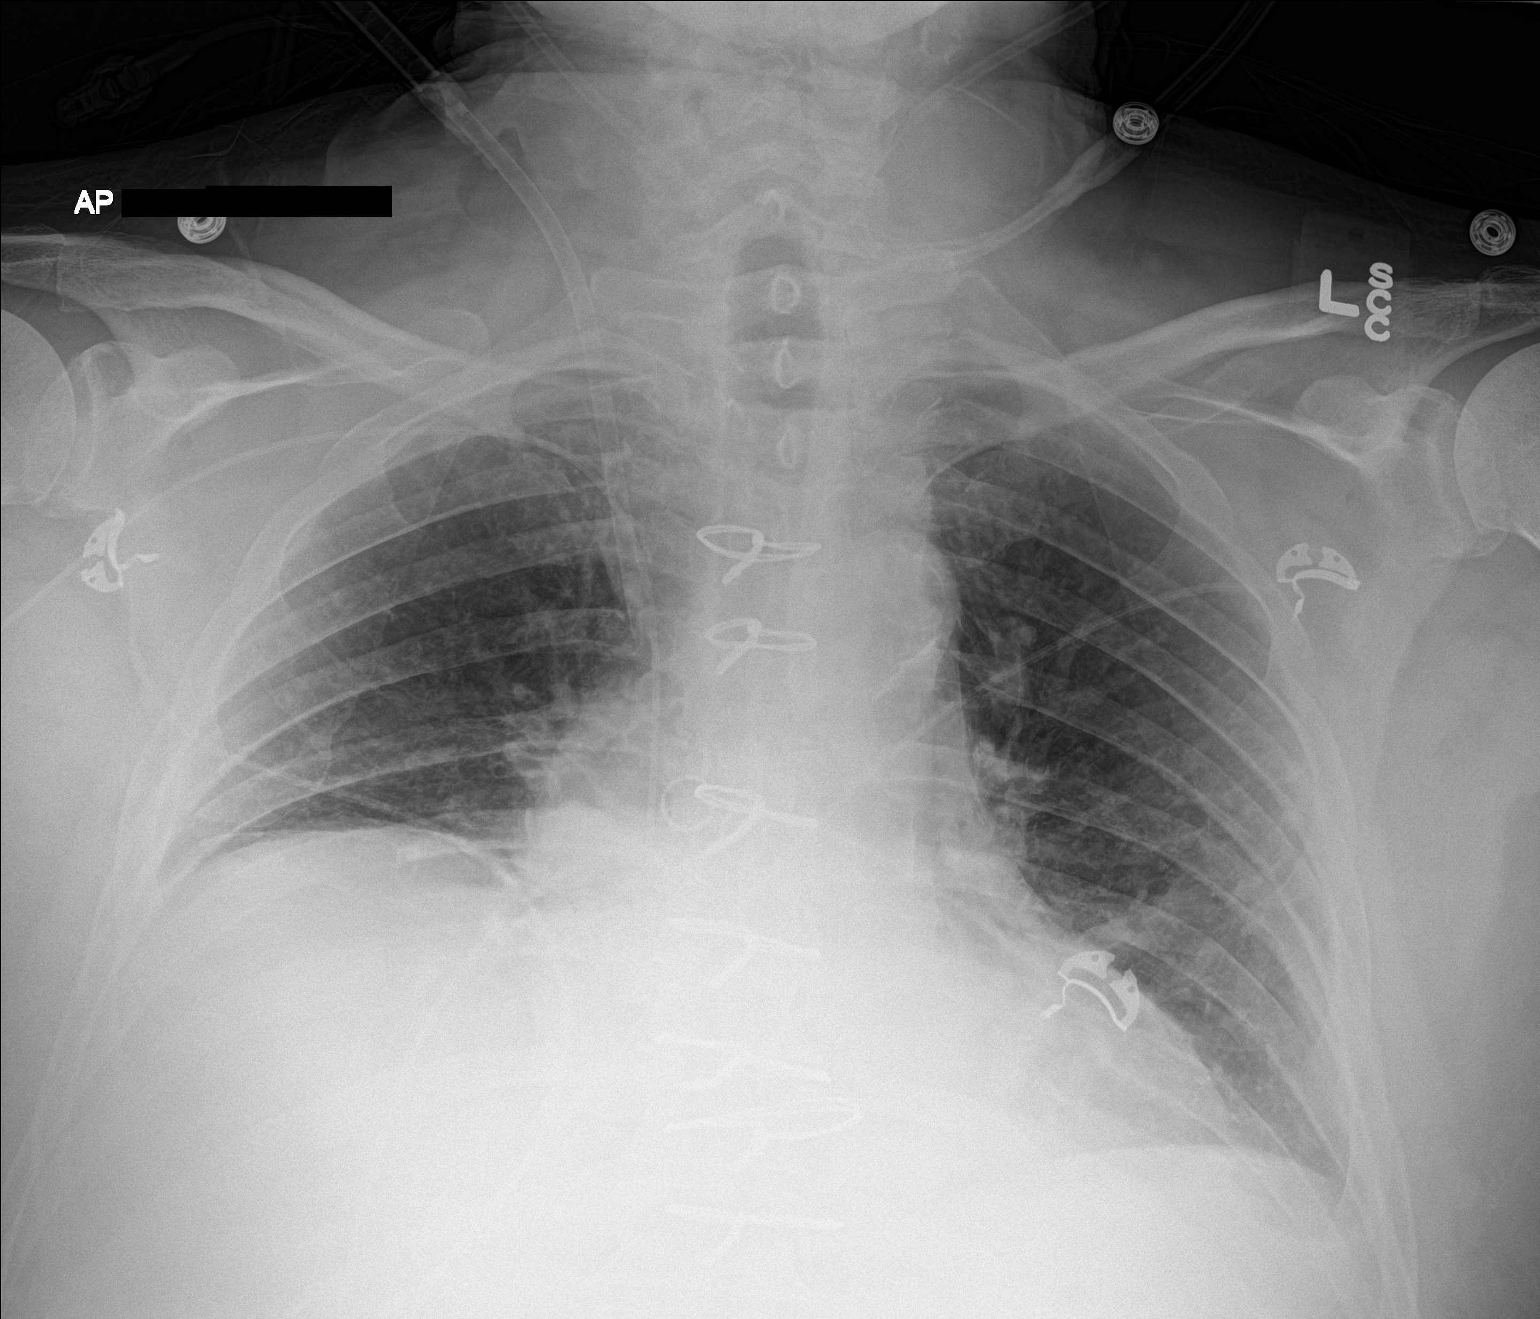

[1 of 1 positions shown; findings below may reference images not displayed]

FINDINGS: Left-sided chest tube is been removed. No visualized pneumothorax.
Right internal jugular sheath remains in place with tip projecting
over the upper SVC. Right upper extremity PICC with tip in the mid
SVC. Elevated right hemidiaphragm as before. Post median sternotomy
with unchanged cardiomegaly. Minimal atelectasis in the left mid
lung. No definite left pleural effusion. No pulmonary edema.
IMPRESSION: 1. Removal of left-sided chest tube without visualized pneumothorax.
Mild atelectasis in the left mid lung.
2. Unchanged elevation of right hemidiaphragm.
3. Right upper extremity PICC tip in the mid SVC.

## 2021-07-16 ENCOUNTER — Other Ambulatory Visit: Payer: Self-pay | Admitting: Nurse Practitioner

## 2021-07-16 DIAGNOSIS — I2510 Atherosclerotic heart disease of native coronary artery without angina pectoris: Secondary | ICD-10-CM

## 2021-07-16 DIAGNOSIS — E7800 Pure hypercholesterolemia, unspecified: Secondary | ICD-10-CM

## 2021-07-17 NOTE — Telephone Encounter (Signed)
Requested Prescriptions  Pending Prescriptions Disp Refills  . ezetimibe (ZETIA) 10 MG tablet [Pharmacy Med Name: EZETIMIBE 10 MG TAB] 90 tablet 0    Sig: TAKE 1 TABLET BY MOUTH DAILY     Cardiovascular:  Antilipid - Sterol Transport Inhibitors Failed - 07/16/2021  3:59 PM      Failed - Total Cholesterol in normal range and within 360 days    Cholesterol, Total  Date Value Ref Range Status  04/15/2020 123 100 - 199 mg/dL Final   Cholesterol Piccolo, Waived  Date Value Ref Range Status  04/19/2019 102 <200 mg/dL Final    Comment:                            Desirable                <200                         Borderline High      200- 239                         High                     >239          Failed - LDL in normal range and within 360 days    LDL Chol Calc (NIH)  Date Value Ref Range Status  04/15/2020 69 0 - 99 mg/dL Final         Failed - HDL in normal range and within 360 days    HDL  Date Value Ref Range Status  04/15/2020 35 (L) >39 mg/dL Final         Failed - Triglycerides in normal range and within 360 days    Triglycerides  Date Value Ref Range Status  04/15/2020 104 0 - 149 mg/dL Final   Triglycerides Piccolo,Waived  Date Value Ref Range Status  04/19/2019 65 <150 mg/dL Final    Comment:                            Normal                   <150                         Borderline High     150 - 199                         High                200 - 499                         Very High                >499          Failed - Valid encounter within last 12 months    Recent Outpatient Visits          1 year ago Benign essential hypertension   [facility] Volney American, PA-C   1 year ago Hyperlipemia, mixed   [place], [place], [place]   2 years ago Acute ST elevation myocardial infarction (STEMI)  of inferior wall [facility])   [facility] Jeananne Rama, Jeannette How, MD   2 years ago Benign essential  hypertension   [place], Jeannette How, MD   2 years ago Benign essential hypertension   [facility] Cr[place], Jeannette How, MD

## 2021-08-03 ENCOUNTER — Other Ambulatory Visit: Payer: Self-pay | Admitting: Neurosurgery

## 2021-08-13 ENCOUNTER — Other Ambulatory Visit: Payer: Self-pay

## 2021-08-13 ENCOUNTER — Encounter (HOSPITAL_COMMUNITY): Payer: Self-pay

## 2021-08-13 NOTE — Progress Notes (Signed)
PCP - Harrel Lemon [facility] clinic) Cardiologist - ryan orgel  PPM/ICD - denies   Chest x-ray - 07/03/19 EKG - 06/20/19 Stress Test - denies ECHO - 06/18/19 Cardiac Cath - 06/01/19  CPAP - denies   Stopped plavix on 08/10/21 due to surgery day change.   ERAS Protcol - clear liquids until Valentine TEST- DOS 08/16/21  Anesthesia review: yes, cardiac history  Patient verbally denies any shortness of breath, fever, cough and chest pain during phone call   -------------  SDW INSTRUCTIONS given:  Your procedure is scheduled on 08/16/21.  Report to [facility] Main Entrance "A" at 2:30 P.M., and check in at the Admitting office.  Call this number if you have problems the morning of surgery:  [phone]   Remember:  Do not eat after midnight the night before your surgery  You may drink clear liquids until 2:30pm the afternoon of your surgery.   Clear liquids allowed are: Water, Non-Citrus Juices (without pulp), Carbonated Beverages, Clear Tea, Black Coffee Only, and Gatorade    Take these medicines the morning of surgery with A SIP OF WATER  amLODipine (NORVASC) carvedilol (COREG) ezetimibe (ZETIA)   As of today, STOP taking any Aspirin (unless otherwise instructed by your surgeon) Aleve, Naproxen, Ibuprofen, Motrin, Advil, Goody's, BC's, all herbal medications, fish oil, meloxicam (MOBIC) and all vitamins.                      Do not wear jewelry, make up, or nail polish            Do not wear lotions, powders, perfumes/colognes, or deodorant.            Do not shave 48 hours prior to surgery.  Men may shave face and neck.            Do not bring valuables to the hospital.            [facility] is not responsible for any belongings or valuables.  Do NOT Smoke (Tobacco/Vaping) or drink Alcohol 24 hours prior to your procedure If you use a CPAP at night, you may bring all equipment for your overnight stay.   Contacts, glasses, dentures or bridgework may not be worn  into surgery.      For patients admitted to the hospital, discharge time will be determined by your treatment team.   Patients discharged the day of surgery will not be allowed to drive home, and someone needs to stay with them for 24 hours.    Special instructions:   [place]- Preparing For Surgery  Before surgery, you can play an important role. Because skin is not sterile, your skin needs to be as free of germs as possible. You can reduce the number of germs on your skin by washing with CHG (chlorahexidine gluconate) Soap before surgery.  CHG is an antiseptic cleaner which kills germs and bonds with the skin to continue killing germs even after washing.    Oral Hygiene is also important to reduce your risk of infection.  Remember - BRUSH YOUR TEETH THE MORNING OF SURGERY WITH YOUR REGULAR TOOTHPASTE  Please do not use if you have an allergy to CHG or antibacterial soaps. If your skin becomes reddened/irritated stop using the CHG.  Do not shave (including legs and underarms) for at least 48 hours prior to first CHG shower. It is OK to shave your face.  Please follow these instructions carefully.   Shower the [employer]  BEFORE SURGERY and the MORNING OF SURGERY with DIAL Soap.   Pat yourself dry with a CLEAN TOWEL.  Wear CLEAN PAJAMAS to bed the night before surgery  Place CLEAN SHEETS on your bed the night of your first shower and DO NOT SLEEP WITH PETS.   Day of Surgery: Please shower morning of surgery  Wear Clean/Comfortable clothing the morning of surgery Do not apply any deodorants/lotions.   Remember to brush your teeth WITH YOUR REGULAR TOOTHPASTE.   Questions were answered. Patient verbalized understanding of instructions.

## 2021-08-13 NOTE — Progress Notes (Signed)
Anesthesia Chart Review: Same day workup  Follows with cardiology for history of CAD s/p inferior STEMI treated with PCI 2005 and more recently inferior STEMI treated with CABG x 3 on 06/05/2019.  Postoperative course was complicated by TIA several weeks post CABG and he has now maintained on Plavix and aspirin.  Last seen by Dr. Ubaldo Glassing 03/15/2021.  Doing well at that time, no ischemia symptoms.  25-month follow-up recommended.  Per telephone encounter 08/04/2021, patient was cleared for surgery and clearance was sent to [place] [facility].  I requested this clearance to be faxed to [place].  Patient reports last dose Plavix 08/10/2021.  Patient will need day of surgery labs and evaluation.  TTE 06/18/2019:  1. Left ventricular ejection fraction, by visual estimation, is 55 to  60%. The left ventricle has normal function. Normal left ventricular size.  There is moderately increased left ventricular hypertrophy.   2. Definity contrast agent was given IV to delineate the left ventricular  endocardial borders.   3. Left ventricular diastolic Doppler parameters are consistent with  impaired relaxation pattern of LV diastolic filling.   4. Global right ventricle has moderately reduced systolic function.The  right ventricular size is normal. Right vetricular wall thickness was not  assessed.   5. Left atrial size was normal.   6. Right atrial size was normal.   7. Moderate pericardial effusion.   8. The pericardial effusion is surrounding the apex and posterior to the  left ventricle.   9. The mitral valve was not well visualized. No evidence of mitral valve  regurgitation.  10. The tricuspid valve is not well visualized. Tricuspid valve  regurgitation is trivial.  11. The aortic valve has an indeterminant number of cusps Aortic valve  regurgitation is trivial by color flow Doppler. Mild aortic valve  sclerosis without stenosis.  12. The pulmonic valve was not well visualized. Pulmonic valve   regurgitation is trivial by color flow Doppler.  13. Normal pulmonary artery systolic pressure.  14. Cardioembolic source cannot be excluded by this study.  15. The interatrial septum was not well visualized.   Carotid ultrasound 06/18/2019: IMPRESSION: Color duplex indicates minimal heterogeneous and calcified plaque, with no hemodynamically significant stenosis by duplex criteria in the extracranial cerebrovascular circulation.     Wynonia Musty [facility] Short Stay Center/Anesthesiology Phone [phone] 08/13/2021 4:26 PM

## 2021-08-13 NOTE — Anesthesia Preprocedure Evaluation (Addendum)
Anesthesia Evaluation  Patient identified by MRN, date of birth, ID band Patient awake    Reviewed: Allergy & Precautions, H&P , NPO status , Patient's Chart, lab work & pertinent test results, reviewed documented beta blocker date and time   History of Anesthesia Complications Negative for: history of anesthetic complications  Airway Mallampati: III  TM Distance: >3 FB Neck ROM: Full    Dental no notable dental hx. (+) Dental Advisory Given   Pulmonary neg pulmonary ROS, former smoker,    Pulmonary exam normal        Cardiovascular hypertension, Pt. on medications and Pt. on home beta blockers + CAD, + Past MI and + CABG  Normal cardiovascular exam     Neuro/Psych TIAnegative psych ROS   GI/Hepatic negative GI ROS, Neg liver ROS,   Endo/Other  Morbid obesity  Renal/GU Renal InsufficiencyRenal disease  negative genitourinary   Musculoskeletal  (+) Arthritis , Osteoarthritis,    Abdominal   Peds  Hematology negative hematology ROS (+)   Anesthesia Other Findings   Reproductive/Obstetrics negative OB ROS                          Anesthesia Physical Anesthesia Plan  ASA: 3  Anesthesia Plan: General   Post-op Pain Management:    Induction: Intravenous  PONV Risk Score and Plan: 3 and Ondansetron, Dexamethasone and Treatment may vary due to age or medical condition  Airway Management Planned: Oral ETT  Additional Equipment:   Intra-op Plan:   Post-operative Plan: Extubation in OR  Informed Consent: I have reviewed the patients History and Physical, chart, labs and discussed the procedure including the risks, benefits and alternatives for the proposed anesthesia with the patient or authorized representative who has indicated his/her understanding and acceptance.     Dental advisory given  Plan Discussed with: CRNA  Anesthesia Plan Comments: (PAT note by Karoline Caldwell,  PA-C: Follows with cardiology for history of CAD s/p inferior STEMI treated with PCI 2005 and more recently inferior STEMI treated with CABG x 3 on 06/05/2019.  Postoperative course was complicated by TIA several weeks post CABG and he has now maintained on Plavix and aspirin.  Last seen by Dr. Ubaldo Glassing 03/15/2021.  Doing well at that time, no ischemia symptoms.  31-month follow-up recommended.  Per telephone encounter 08/04/2021, patient was cleared for surgery and clearance was sent to [place] [facility].  I requested this clearance to be faxed to [place].  Patient reports last dose Plavix 08/10/2021.  Patient will need day of surgery labs and evaluation.  TTE 06/18/2019: 1. Left ventricular ejection fraction, by visual estimation, is 55 to  60%. The left ventricle has normal function. Normal left ventricular size.  There is moderately increased left ventricular hypertrophy.  2. Definity contrast agent was given IV to delineate the left ventricular  endocardial borders.  3. Left ventricular diastolic Doppler parameters are consistent with  impaired relaxation pattern of LV diastolic filling.  4. Global right ventricle has moderately reduced systolic function.The  right ventricular size is normal. Right vetricular wall thickness was not  assessed.  5. Left atrial size was normal.  6. Right atrial size was normal.  7. Moderate pericardial effusion.  8. The pericardial effusion is surrounding the apex and posterior to the  left ventricle.  9. The mitral valve was not well visualized. No evidence of mitral valve  regurgitation.  10. The tricuspid valve is not well visualized. Tricuspid valve  regurgitation is  trivial.  11. The aortic valve has an indeterminant number of cusps Aortic valve  regurgitation is trivial by color flow Doppler. Mild aortic valve  sclerosis without stenosis.  12. The pulmonic valve was not well visualized. Pulmonic valve  regurgitation is trivial by color flow  Doppler.  13. Normal pulmonary artery systolic pressure.  14. Cardioembolic source cannot be excluded by this study.  15. The interatrial septum was not well visualized.   Carotid ultrasound 06/18/2019: IMPRESSION: Color duplex indicates minimal heterogeneous and calcified plaque, with no hemodynamically significant stenosis by duplex criteria in the extracranial cerebrovascular circulation.  )       Anesthesia Quick Evaluation

## 2021-08-16 ENCOUNTER — Ambulatory Visit (HOSPITAL_COMMUNITY): Payer: Medicare PPO | Admitting: Anesthesiology

## 2021-08-16 ENCOUNTER — Ambulatory Visit (HOSPITAL_COMMUNITY): Payer: Medicare PPO

## 2021-08-16 ENCOUNTER — Ambulatory Visit (HOSPITAL_COMMUNITY)
Admission: RE | Admit: 2021-08-16 | Discharge: 2021-08-17 | Disposition: A | Discharge status: Home / Self Care | Payer: Medicare PPO | Attending: Neurosurgery | Admitting: Neurosurgery

## 2021-08-16 ENCOUNTER — Ambulatory Visit (HOSPITAL_COMMUNITY): Payer: Medicare PPO | Admitting: Physician Assistant

## 2021-08-16 ENCOUNTER — Encounter (HOSPITAL_COMMUNITY)
Admission: RE | Admit: 2021-08-16 | Discharge: 2021-08-16 | Disposition: A | Discharge status: Home / Self Care | Payer: Medicare PPO | Source: Ambulatory Visit | Admitting: Neurosurgery

## 2021-08-16 ENCOUNTER — Encounter (HOSPITAL_COMMUNITY): Payer: Self-pay | Admitting: Neurosurgery

## 2021-08-16 ENCOUNTER — Encounter (HOSPITAL_COMMUNITY)
Admission: RE | Disposition: A | Discharge status: Home / Self Care | Payer: Self-pay | Source: Home / Self Care | Attending: Neurosurgery

## 2021-08-16 DIAGNOSIS — Z951 Presence of aortocoronary bypass graft: Secondary | ICD-10-CM | POA: Diagnosis not present

## 2021-08-16 DIAGNOSIS — Z20822 Contact with and (suspected) exposure to covid-19: Secondary | ICD-10-CM | POA: Diagnosis not present

## 2021-08-16 DIAGNOSIS — Z419 Encounter for procedure for purposes other than remedying health state, unspecified: Secondary | ICD-10-CM

## 2021-08-16 DIAGNOSIS — Z7982 Long term (current) use of aspirin: Secondary | ICD-10-CM | POA: Diagnosis not present

## 2021-08-16 DIAGNOSIS — Z6836 Body mass index (BMI) 36.0-36.9, adult: Secondary | ICD-10-CM | POA: Diagnosis not present

## 2021-08-16 DIAGNOSIS — M4316 Spondylolisthesis, lumbar region: Secondary | ICD-10-CM | POA: Insufficient documentation

## 2021-08-16 DIAGNOSIS — I10 Essential (primary) hypertension: Secondary | ICD-10-CM | POA: Insufficient documentation

## 2021-08-16 DIAGNOSIS — Z87891 Personal history of nicotine dependence: Secondary | ICD-10-CM | POA: Insufficient documentation

## 2021-08-16 DIAGNOSIS — Z8673 Personal history of transient ischemic attack (TIA), and cerebral infarction without residual deficits: Secondary | ICD-10-CM | POA: Insufficient documentation

## 2021-08-16 DIAGNOSIS — M48061 Spinal stenosis, lumbar region without neurogenic claudication: Secondary | ICD-10-CM | POA: Diagnosis not present

## 2021-08-16 DIAGNOSIS — I252 Old myocardial infarction: Secondary | ICD-10-CM | POA: Diagnosis not present

## 2021-08-16 DIAGNOSIS — M1990 Unspecified osteoarthritis, unspecified site: Secondary | ICD-10-CM | POA: Insufficient documentation

## 2021-08-16 DIAGNOSIS — Z7902 Long term (current) use of antithrombotics/antiplatelets: Secondary | ICD-10-CM | POA: Diagnosis not present

## 2021-08-16 DIAGNOSIS — I2510 Atherosclerotic heart disease of native coronary artery without angina pectoris: Secondary | ICD-10-CM | POA: Diagnosis not present

## 2021-08-16 DIAGNOSIS — M5416 Radiculopathy, lumbar region: Secondary | ICD-10-CM | POA: Insufficient documentation

## 2021-08-16 HISTORY — DX: Cerebral infarction, unspecified: I63.9

## 2021-08-16 HISTORY — PX: LUMBAR LAMINECTOMY/DECOMPRESSION MICRODISCECTOMY: SHX5026

## 2021-08-16 LAB — CBC
HCT: 43.3 % (ref 39.0–52.0)
Hemoglobin: 14.7 g/dL (ref 13.0–17.0)
MCH: 31.7 pg (ref 26.0–34.0)
MCHC: 33.9 g/dL (ref 30.0–36.0)
MCV: 93.5 fL (ref 80.0–100.0)
Platelets: 182 10*3/uL (ref 150–400)
RBC: 4.63 MIL/uL (ref 4.22–5.81)
RDW: 13 % (ref 11.5–15.5)
WBC: 8.2 10*3/uL (ref 4.0–10.5)
nRBC: 0 % (ref 0.0–0.2)

## 2021-08-16 LAB — BASIC METABOLIC PANEL
Anion gap: 9 (ref 5–15)
BUN: 13 mg/dL (ref 8–23)
CO2: 23 mmol/L (ref 22–32)
Calcium: 9.1 mg/dL (ref 8.9–10.3)
Chloride: 107 mmol/L (ref 98–111)
Creatinine, Ser: 1.32 mg/dL — ABNORMAL HIGH (ref 0.61–1.24)
GFR, Estimated: 58 mL/min — ABNORMAL LOW (ref >60)
Glucose, Bld: 112 mg/dL — ABNORMAL HIGH (ref 70–99)
Potassium: 4 mmol/L (ref 3.5–5.1)
Sodium: 139 mmol/L (ref 135–145)

## 2021-08-16 LAB — SURGICAL PCR SCREEN
MRSA, PCR: NEGATIVE
Staphylococcus aureus: NEGATIVE

## 2021-08-16 LAB — SARS CORONAVIRUS 2 BY RT PCR (HOSPITAL ORDER, PERFORMED IN ~~LOC~~ HOSPITAL LAB): SARS Coronavirus 2: NEGATIVE

## 2021-08-16 SURGERY — LUMBAR LAMINECTOMY/DECOMPRESSION MICRODISCECTOMY 1 LEVEL
Anesthesia: General | Site: Back | Laterality: Left

## 2021-08-16 MED ORDER — THROMBIN 5000 UNITS EX SOLR
CUTANEOUS | Status: AC
  Filled 2021-08-16: qty 10000

## 2021-08-16 MED ORDER — EZETIMIBE 10 MG PO TABS
10.0000 mg | ORAL_TABLET | Freq: Every day | ORAL | Status: DC
  Administered 2021-08-17: 10 mg via ORAL
  Filled 2021-08-16: qty 1

## 2021-08-16 MED ORDER — ROCURONIUM BROMIDE 10 MG/ML (PF) SYRINGE
PREFILLED_SYRINGE | INTRAVENOUS | Status: AC
  Filled 2021-08-16: qty 10

## 2021-08-16 MED ORDER — ONDANSETRON HCL 4 MG/2ML IJ SOLN: 4.0000 mg | Freq: Four times a day (QID) | INTRAMUSCULAR | Status: DC | PRN

## 2021-08-16 MED ORDER — DEXAMETHASONE SODIUM PHOSPHATE 10 MG/ML IJ SOLN
INTRAMUSCULAR | Status: DC | PRN
Start: 2021-08-16 — End: 2021-08-16
  Administered 2021-08-16: 10 mg via INTRAVENOUS

## 2021-08-16 MED ORDER — CEFAZOLIN SODIUM-DEXTROSE 2-4 GM/100ML-% IV SOLN
INTRAVENOUS | Status: AC
  Filled 2021-08-16: qty 100

## 2021-08-16 MED ORDER — ACETAMINOPHEN 325 MG PO TABS: 650.0000 mg | ORAL_TABLET | ORAL | Status: DC | PRN

## 2021-08-16 MED ORDER — FENTANYL CITRATE (PF) 250 MCG/5ML IJ SOLN
INTRAMUSCULAR | Status: DC | PRN
  Administered 2021-08-16 (×2): 50 ug via INTRAVENOUS
  Administered 2021-08-16: 100 ug via INTRAVENOUS

## 2021-08-16 MED ORDER — ALUM & MAG HYDROXIDE-SIMETH 200-200-20 MG/5ML PO SUSP: 30.0000 mL | Freq: Four times a day (QID) | ORAL | Status: DC | PRN

## 2021-08-16 MED ORDER — ONDANSETRON HCL 4 MG/2ML IJ SOLN
INTRAMUSCULAR | Status: AC
  Filled 2021-08-16: qty 2

## 2021-08-16 MED ORDER — ROCURONIUM BROMIDE 10 MG/ML (PF) SYRINGE
PREFILLED_SYRINGE | INTRAVENOUS | Status: DC | PRN
  Administered 2021-08-16: 100 mg via INTRAVENOUS

## 2021-08-16 MED ORDER — LIDOCAINE-EPINEPHRINE 1 %-1:100000 IJ SOLN
INTRAMUSCULAR | Status: DC | PRN
  Administered 2021-08-16: 10 mL via INTRADERMAL

## 2021-08-16 MED ORDER — ONDANSETRON HCL 4 MG/2ML IJ SOLN
INTRAMUSCULAR | Status: DC | PRN
  Administered 2021-08-16: 4 mg via INTRAVENOUS

## 2021-08-16 MED ORDER — PANTOPRAZOLE SODIUM 40 MG IV SOLR
40.0000 mg | Freq: Every day | INTRAVENOUS | Status: DC
  Administered 2021-08-16: 40 mg via INTRAVENOUS
  Filled 2021-08-16: qty 40

## 2021-08-16 MED ORDER — MENTHOL 3 MG MT LOZG: 1.0000 | LOZENGE | OROMUCOSAL | Status: DC | PRN

## 2021-08-16 MED ORDER — ONDANSETRON HCL 4 MG PO TABS: 4.0000 mg | ORAL_TABLET | Freq: Four times a day (QID) | ORAL | Status: DC | PRN

## 2021-08-16 MED ORDER — DEXAMETHASONE SODIUM PHOSPHATE 10 MG/ML IJ SOLN
INTRAMUSCULAR | Status: AC
  Filled 2021-08-16: qty 1

## 2021-08-16 MED ORDER — PHENYLEPHRINE 40 MCG/ML (10ML) SYRINGE FOR IV PUSH (FOR BLOOD PRESSURE SUPPORT)
PREFILLED_SYRINGE | INTRAVENOUS | Status: DC | PRN
  Administered 2021-08-16: 120 ug via INTRAVENOUS

## 2021-08-16 MED ORDER — HYDROCODONE-ACETAMINOPHEN 5-325 MG PO TABS
1.0000 | ORAL_TABLET | ORAL | Status: DC | PRN
  Administered 2021-08-17 (×2): 1 via ORAL
  Filled 2021-08-16 (×2): qty 1

## 2021-08-16 MED ORDER — HYDROMORPHONE HCL 1 MG/ML IJ SOLN
0.2500 mg | INTRAMUSCULAR | Status: DC | PRN
  Administered 2021-08-16 (×2): 0.5 mg via INTRAVENOUS

## 2021-08-16 MED ORDER — CEFAZOLIN SODIUM-DEXTROSE 2-4 GM/100ML-% IV SOLN
2.0000 g | INTRAVENOUS | Status: AC
  Administered 2021-08-16: 2 g via INTRAVENOUS

## 2021-08-16 MED ORDER — LACTATED RINGERS IV SOLN: INTRAVENOUS | Status: DC

## 2021-08-16 MED ORDER — CEFAZOLIN SODIUM-DEXTROSE 2-4 GM/100ML-% IV SOLN
2.0000 g | Freq: Three times a day (TID) | INTRAVENOUS | Status: AC
  Administered 2021-08-17 (×2): 2 g via INTRAVENOUS
  Filled 2021-08-16 (×2): qty 100

## 2021-08-16 MED ORDER — HEMOSTATIC AGENTS (NO CHARGE) OPTIME
TOPICAL | Status: DC | PRN
  Administered 2021-08-16: 1 via TOPICAL

## 2021-08-16 MED ORDER — PROPOFOL 10 MG/ML IV BOLUS
INTRAVENOUS | Status: DC | PRN
  Administered 2021-08-16: 200 mg via INTRAVENOUS

## 2021-08-16 MED ORDER — MELOXICAM 7.5 MG PO TABS
7.5000 mg | ORAL_TABLET | Freq: Every day | ORAL | Status: DC
  Administered 2021-08-17: 7.5 mg via ORAL
  Filled 2021-08-16: qty 1

## 2021-08-16 MED ORDER — PHENOL 1.4 % MT LIQD: 1.0000 | OROMUCOSAL | Status: DC | PRN

## 2021-08-16 MED ORDER — HYDROMORPHONE HCL 1 MG/ML IJ SOLN
INTRAMUSCULAR | Status: AC
  Filled 2021-08-16: qty 1

## 2021-08-16 MED ORDER — LIDOCAINE 2% (20 MG/ML) 5 ML SYRINGE
INTRAMUSCULAR | Status: DC | PRN
  Administered 2021-08-16: 100 mg via INTRAVENOUS

## 2021-08-16 MED ORDER — ASPIRIN EC 81 MG PO TBEC
81.0000 mg | DELAYED_RELEASE_TABLET | Freq: Every day | ORAL | Status: DC
  Administered 2021-08-17: 81 mg via ORAL
  Filled 2021-08-16: qty 1

## 2021-08-16 MED ORDER — LIDOCAINE-EPINEPHRINE 1 %-1:100000 IJ SOLN
INTRAMUSCULAR | Status: AC
  Filled 2021-08-16: qty 1

## 2021-08-16 MED ORDER — ACETAMINOPHEN 500 MG PO TABS: 1000.0000 mg | ORAL_TABLET | Freq: Once | ORAL | Status: AC

## 2021-08-16 MED ORDER — THROMBIN 5000 UNITS EX SOLR
CUTANEOUS | Status: DC | PRN
  Administered 2021-08-16 (×2): 5000 [IU] via TOPICAL

## 2021-08-16 MED ORDER — FENTANYL CITRATE (PF) 250 MCG/5ML IJ SOLN
INTRAMUSCULAR | Status: AC
  Filled 2021-08-16: qty 5

## 2021-08-16 MED ORDER — SODIUM CHLORIDE 0.9 % IV SOLN: 250.0000 mL | INTRAVENOUS | Status: DC

## 2021-08-16 MED ORDER — ACETAMINOPHEN 500 MG PO TABS
ORAL_TABLET | ORAL | Status: AC
  Administered 2021-08-16: 1000 mg via ORAL
  Filled 2021-08-16: qty 2

## 2021-08-16 MED ORDER — CHLORHEXIDINE GLUCONATE 0.12 % MT SOLN
15.0000 mL | Freq: Once | OROMUCOSAL | Status: AC
  Administered 2021-08-16: 15 mL via OROMUCOSAL
  Filled 2021-08-16: qty 15

## 2021-08-16 MED ORDER — LIDOCAINE 2% (20 MG/ML) 5 ML SYRINGE
INTRAMUSCULAR | Status: AC
  Filled 2021-08-16: qty 5

## 2021-08-16 MED ORDER — SODIUM CHLORIDE 0.9% FLUSH
3.0000 mL | Freq: Two times a day (BID) | INTRAVENOUS | Status: DC
  Administered 2021-08-17: 3 mL via INTRAVENOUS

## 2021-08-16 MED ORDER — BUPIVACAINE HCL (PF) 0.25 % IJ SOLN
INTRAMUSCULAR | Status: AC
  Filled 2021-08-16: qty 30

## 2021-08-16 MED ORDER — SUGAMMADEX SODIUM 200 MG/2ML IV SOLN
INTRAVENOUS | Status: DC | PRN
  Administered 2021-08-16: 400 mg via INTRAVENOUS

## 2021-08-16 MED ORDER — HYDROMORPHONE HCL 1 MG/ML IJ SOLN
0.5000 mg | INTRAMUSCULAR | Status: DC | PRN
  Administered 2021-08-16: 0.5 mg via INTRAVENOUS
  Filled 2021-08-16: qty 0.5

## 2021-08-16 MED ORDER — CARVEDILOL 25 MG PO TABS
25.0000 mg | ORAL_TABLET | Freq: Two times a day (BID) | ORAL | Status: DC
  Administered 2021-08-16 – 2021-08-17 (×2): 25 mg via ORAL
  Filled 2021-08-16 (×2): qty 1

## 2021-08-16 MED ORDER — ACETAMINOPHEN 650 MG RE SUPP: 650.0000 mg | RECTAL | Status: DC | PRN

## 2021-08-16 MED ORDER — CYCLOBENZAPRINE HCL 10 MG PO TABS
10.0000 mg | ORAL_TABLET | Freq: Three times a day (TID) | ORAL | Status: DC | PRN
  Administered 2021-08-16 – 2021-08-17 (×2): 10 mg via ORAL
  Filled 2021-08-16 (×2): qty 1

## 2021-08-16 MED ORDER — AMLODIPINE BESYLATE 5 MG PO TABS
10.0000 mg | ORAL_TABLET | Freq: Every day | ORAL | Status: DC
  Administered 2021-08-17: 10 mg via ORAL
  Filled 2021-08-16: qty 2

## 2021-08-16 MED ORDER — 0.9 % SODIUM CHLORIDE (POUR BTL) OPTIME
TOPICAL | Status: DC | PRN
  Administered 2021-08-16: 1000 mL

## 2021-08-16 MED ORDER — SODIUM CHLORIDE 0.9% FLUSH: 3.0000 mL | INTRAVENOUS | Status: DC | PRN

## 2021-08-16 MED ORDER — ORAL CARE MOUTH RINSE: 15.0000 mL | Freq: Once | OROMUCOSAL | Status: AC

## 2021-08-16 MED ORDER — BUPIVACAINE HCL (PF) 0.25 % IJ SOLN
INTRAMUSCULAR | Status: DC | PRN
  Administered 2021-08-16: 10 mL

## 2021-08-16 MED ORDER — ATORVASTATIN CALCIUM 80 MG PO TABS: 80.0000 mg | ORAL_TABLET | Freq: Every day | ORAL | Status: DC

## 2021-08-16 MED ORDER — PHENYLEPHRINE HCL-NACL 20-0.9 MG/250ML-% IV SOLN
INTRAVENOUS | Status: DC | PRN
  Administered 2021-08-16: 20 ug/min via INTRAVENOUS

## 2021-08-16 SURGICAL SUPPLY — 59 items
ADH SKN CLS APL DERMABOND .7 (GAUZE/BANDAGES/DRESSINGS) ×1
APL SKNCLS STERI-STRIP NONHPOA (GAUZE/BANDAGES/DRESSINGS) ×1
BAG COUNTER SPONGE SURGICOUNT (BAG) ×2 IMPLANT
BAG SPNG CNTER NS LX DISP (BAG) ×1
BAND INSRT 18 STRL LF DISP RB (MISCELLANEOUS) ×2
BAND RUBBER #18 3X1/16 STRL (MISCELLANEOUS) ×4 IMPLANT
BENZOIN TINCTURE PRP APPL 2/3 (GAUZE/BANDAGES/DRESSINGS) ×2 IMPLANT
BLADE CLIPPER SURG (BLADE) IMPLANT
BLADE SURG 11 STRL SS (BLADE) ×2 IMPLANT
BUR CUTTER 7.0 ROUND (BURR) ×2 IMPLANT
BUR MATCHSTICK NEURO 3.0 LAGG (BURR) ×2 IMPLANT
CANISTER SUCT 3000ML PPV (MISCELLANEOUS) ×2 IMPLANT
CARTRIDGE OIL MAESTRO DRILL (MISCELLANEOUS) ×1 IMPLANT
CLSR STERI-STRIP ANTIMIC 1/2X4 (GAUZE/BANDAGES/DRESSINGS) ×1 IMPLANT
DECANTER SPIKE VIAL GLASS SM (MISCELLANEOUS) ×2 IMPLANT
DERMABOND ADVANCED (GAUZE/BANDAGES/DRESSINGS) ×1
DERMABOND ADVANCED .7 DNX12 (GAUZE/BANDAGES/DRESSINGS) ×1 IMPLANT
DIFFUSER DRILL AIR PNEUMATIC (MISCELLANEOUS) ×2 IMPLANT
DRAPE HALF SHEET 40X57 (DRAPES) ×1 IMPLANT
DRAPE LAPAROTOMY 100X72X124 (DRAPES) ×2 IMPLANT
DRAPE MICROSCOPE LEICA (MISCELLANEOUS) ×2 IMPLANT
DRAPE SURG 17X23 STRL (DRAPES) ×2 IMPLANT
DRSG OPSITE POSTOP 4X6 (GAUZE/BANDAGES/DRESSINGS) ×1 IMPLANT
DURAPREP 26ML APPLICATOR (WOUND CARE) ×2 IMPLANT
ELECT BLADE 4.0 EZ CLEAN MEGAD (MISCELLANEOUS) ×2
ELECT REM PT RETURN 9FT ADLT (ELECTROSURGICAL) ×2
ELECTRODE BLDE 4.0 EZ CLN MEGD (MISCELLANEOUS) IMPLANT
ELECTRODE REM PT RTRN 9FT ADLT (ELECTROSURGICAL) ×1 IMPLANT
GAUZE 4X4 16PLY ~~LOC~~+RFID DBL (SPONGE) ×2 IMPLANT
GAUZE SPONGE 4X4 12PLY STRL (GAUZE/BANDAGES/DRESSINGS) ×2 IMPLANT
GLOVE EXAM NITRILE XL STR (GLOVE) IMPLANT
GLOVE SURG ENC MOIS LTX SZ7 (GLOVE) ×1 IMPLANT
GLOVE SURG ENC MOIS LTX SZ8 (GLOVE) ×2 IMPLANT
GLOVE SURG POLYISO LF SZ6.5 (GLOVE) ×1 IMPLANT
GLOVE SURG UNDER LTX SZ8.5 (GLOVE) ×2 IMPLANT
GLOVE SURG UNDER POLY LF SZ7 (GLOVE) ×2 IMPLANT
GOWN STRL REUS W/ TWL LRG LVL3 (GOWN DISPOSABLE) ×1 IMPLANT
GOWN STRL REUS W/ TWL XL LVL3 (GOWN DISPOSABLE) ×2 IMPLANT
GOWN STRL REUS W/TWL 2XL LVL3 (GOWN DISPOSABLE) IMPLANT
GOWN STRL REUS W/TWL LRG LVL3 (GOWN DISPOSABLE) ×6
GOWN STRL REUS W/TWL XL LVL3 (GOWN DISPOSABLE) ×4
KIT BASIN OR (CUSTOM PROCEDURE TRAY) ×2 IMPLANT
KIT TURNOVER KIT B (KITS) ×2 IMPLANT
NDL SPNL 22GX3.5 QUINCKE BK (NEEDLE) ×1 IMPLANT
NEEDLE HYPO 22GX1.5 SAFETY (NEEDLE) ×2 IMPLANT
NEEDLE SPNL 22GX3.5 QUINCKE BK (NEEDLE) ×2 IMPLANT
NS IRRIG 1000ML POUR BTL (IV SOLUTION) ×2 IMPLANT
OIL CARTRIDGE MAESTRO DRILL (MISCELLANEOUS) ×2
PACK LAMINECTOMY NEURO (CUSTOM PROCEDURE TRAY) ×2 IMPLANT
SPONGE SURGIFOAM ABS GEL SZ50 (HEMOSTASIS) ×2 IMPLANT
SPONGE T-LAP 4X18 ~~LOC~~+RFID (SPONGE) ×1 IMPLANT
STRIP CLOSURE SKIN 1/2X4 (GAUZE/BANDAGES/DRESSINGS) ×2 IMPLANT
SUT VIC AB 0 CT1 18XCR BRD8 (SUTURE) ×1 IMPLANT
SUT VIC AB 0 CT1 8-18 (SUTURE) ×2
SUT VIC AB 2-0 CT1 18 (SUTURE) ×2 IMPLANT
SUT VICRYL 4-0 PS2 18IN ABS (SUTURE) ×2 IMPLANT
TOWEL GREEN STERILE (TOWEL DISPOSABLE) ×2 IMPLANT
TOWEL GREEN STERILE FF (TOWEL DISPOSABLE) ×2 IMPLANT
WATER STERILE IRR 1000ML POUR (IV SOLUTION) ×2 IMPLANT

## 2021-08-16 NOTE — Anesthesia Procedure Notes (Addendum)
Procedure Name: Intubation Date/Time: 08/16/2021 6:21 PM Performed by: Thelma Comp, CRNA Pre-anesthesia Checklist: Patient identified, Emergency Drugs available, Suction available and Patient being monitored Patient Re-evaluated:Patient Re-evaluated prior to induction Oxygen Delivery Method: Circle System Utilized Preoxygenation: Pre-oxygenation with 100% oxygen Induction Type: IV induction Ventilation: Mask ventilation without difficulty Laryngoscope Size: Mac and 4 Grade View: Grade II Tube type: Oral Tube size: 7.5 mm Number of attempts: 1 Airway Equipment and Method: Stylet Placement Confirmation: ETT inserted through vocal cords under direct vision, positive ETCO2 and breath sounds checked- equal and bilateral Secured at: 22 cm Tube secured with: Tape Dental Injury: Teeth and Oropharynx as per pre-operative assessment

## 2021-08-16 NOTE — H&P (Signed)
Andre Gallegos is an 70 y.o. male.   Chief Complaint: Left hip and leg pain HPI: 70 year old gentleman with left hip and leg pain imaging showing a grade 1 spondylolisthesis severe spinal stenosis with a left-sided spur due to his predominance of left leg radicular symptoms minimal back pain I recommended left-sided decompression only we extensively over the risks and benefits after failure conservative treatment as well as perioperative course expectations of outcome and alternatives of surgery and he understood and agreed to proceed forward.  Past Medical History:  Diagnosis Date   Arthritis    CAD (coronary artery disease)    Hyperlipidemia    Hypertension    Myocardial infarction ([name]) 2005   Stroke [facility])     Past Surgical History:  Procedure Laterality Date   APPENDECTOMY     CARDIAC CATHETERIZATION     COLONOSCOPY WITH PROPOFOL N/A 11/01/2016   Procedure: COLONOSCOPY WITH PROPOFOL;  Surgeon: Jonathon Bellows, MD;  Location: [facility] ENDOSCOPY;  Service: Endoscopy;  Laterality: N/A;   CORONARY ANGIOPLASTY     CORONARY ARTERY BYPASS GRAFT N/A 06/05/2019   Procedure: CORONARY ARTERY BYPASS GRAFTING (CABG) x 3 WITH ENDOSCOPIC HARVESTING OF RIGHT GREATER SAPHENOUS VEIN. LIMA TO LAD.;  Surgeon: Ivin Poot, MD;  Location: [place];  Service: Open Heart Surgery;  Laterality: N/A;   CORONARY/GRAFT ACUTE MI REVASCULARIZATION N/A 06/01/2019   Procedure: Coronary/Graft Acute MI Revascularization;  Surgeon: Yolonda Kida, MD;  Location: [place] CV LAB;  Service: Cardiovascular;  Laterality: N/A;   LEFT HEART CATH AND CORONARY ANGIOGRAPHY N/A 06/01/2019   Procedure: LEFT HEART CATH AND CORONARY ANGIOGRAPHY;  Surgeon: Yolonda Kida, MD;  Location: [place] CV LAB;  Service: Cardiovascular;  Laterality: N/A;   TEE WITHOUT CARDIOVERSION N/A 06/05/2019   Procedure: TRANSESOPHAGEAL ECHOCARDIOGRAM (TEE);  Surgeon: Prescott Gum, Collier Salina, MD;  Location: [place];  Service: Open Heart Surgery;  Laterality:  N/A;    Family History  Problem Relation Age of Onset   Lung cancer Father    Social History:  reports that he quit smoking about 32 years ago. His smoking use included cigarettes. He quit smokeless tobacco use about 3 years ago.  His smokeless tobacco use included snuff. He reports that he does not currently use alcohol. He reports that he does not use drugs.  Allergies: No Known Allergies  Medications Prior to Admission  Medication Sig Dispense Refill   amLODipine (NORVASC) 10 MG tablet 1 TABLET BY MOUTH EVERY DAY 90 tablet 1   atorvastatin (LIPITOR) 80 MG tablet Take 1 tablet (80 mg total) by mouth daily at 6 PM. 90 tablet 1   carvedilol (COREG) 25 MG tablet Take 25 mg by mouth 2 (two) times daily.     ezetimibe (ZETIA) 10 MG tablet TAKE 1 TABLET BY MOUTH DAILY 90 tablet 0   FIBER PO Take 3 capsules by mouth in the morning and at bedtime.     Lactobacillus (PROBIOTIC ACIDOPHILUS PO) Take 1 capsule by mouth at bedtime.      meloxicam (MOBIC) 7.5 MG tablet Take 7.5 mg by mouth daily.     aspirin 81 MG EC tablet Take 81 mg by mouth daily. Swallow whole. (Patient not taking: Reported on 08/10/2021)     carvedilol (COREG) 12.5 MG tablet Take 1 tablet (12.5 mg total) by mouth 2 (two) times daily with a meal. 180 tablet 1   clopidogrel (PLAVIX) 75 MG tablet Take 1 tablet (75 mg total) by mouth daily. (Patient not taking: Reported  on 08/10/2021) 30 tablet 1    Results for orders placed or performed during the hospital encounter of 08/16/21 (from the past 48 hour(s))  SARS Coronavirus 2 by RT PCR (hospital order, performed in [facility] hospital lab) Nasopharyngeal Nasopharyngeal Swab     Status: None   Collection Time: 08/16/21  3:13 PM   Specimen: Nasopharyngeal Swab  Result Value Ref Range   SARS Coronavirus 2 NEGATIVE NEGATIVE    Comment: (NOTE) SARS-CoV-2 target nucleic acids are NOT DETECTED.  The SARS-CoV-2 RNA is generally detectable in upper and lower respiratory specimens  during the acute phase of infection. The lowest concentration of SARS-CoV-2 viral copies this assay can detect is 250 copies / mL. A negative result does not preclude SARS-CoV-2 infection and should not be used as the sole basis for treatment or other patient management decisions.  A negative result may occur with improper specimen collection / handling, submission of specimen other than nasopharyngeal swab, presence of viral mutation(s) within the areas targeted by this assay, and inadequate number of viral copies (<250 copies / mL). A negative result must be combined with clinical observations, patient history, and epidemiological information.  Fact Sheet for Patients:   StrictlyIdeas.no  Fact Sheet for Healthcare Providers: BankingDealers.co.za  This test is not yet approved or  cleared by the [place] FDA and has been authorized for detection and/or diagnosis of SARS-CoV-2 by FDA under an Emergency Use Authorization (EUA).  This EUA will remain in effect (meaning this test can be used) for the duration of the COVID-19 declaration under Section 564(b)(1) of the Act, 21 U.S.C. section 360bbb-3(b)(1), unless the authorization is terminated or revoked sooner.  Performed at [facility], [place] [address] [place], [place] [address]   Basic metabolic panel per protocol     Status: Abnormal   Collection Time: 08/16/21  3:13 PM  Result Value Ref Range   Sodium 139 135 - 145 mmol/L   Potassium 4.0 3.5 - 5.1 mmol/L   Chloride 107 98 - 111 mmol/L   CO2 23 22 - 32 mmol/L   Glucose, Bld 112 (H) 70 - 99 mg/dL    Comment: Glucose reference range applies only to samples taken after fasting for at least 8 hours.   BUN 13 8 - 23 mg/dL   Creatinine, Ser 1.32 (H) 0.61 - 1.24 mg/dL   Calcium 9.1 8.9 - 10.3 mg/dL   GFR, Estimated 58 (L) >60 mL/min    Comment: (NOTE) Calculated using the CKD-EPI Creatinine Equation (2021)    Anion gap 9  5 - 15    Comment: Performed at [place] [address] [place], [place] [address]  CBC per protocol     Status: None   Collection Time: 08/16/21  3:13 PM  Result Value Ref Range   WBC 8.2 4.0 - 10.5 K/uL   RBC 4.63 4.22 - 5.81 MIL/uL   Hemoglobin 14.7 13.0 - 17.0 g/dL   HCT 43.3 39.0 - 52.0 %   MCV 93.5 80.0 - 100.0 fL   MCH 31.7 26.0 - 34.0 pg   MCHC 33.9 30.0 - 36.0 g/dL   RDW 13.0 11.5 - 15.5 %   Platelets 182 150 - 400 K/uL   nRBC 0.0 0.0 - 0.2 %    Comment: Performed at [facility], [place] [address] [place], [place] [address]   No results found.  Review of Systems  Musculoskeletal:  Positive for back pain.  Neurological:  Positive  for numbness.   Blood pressure (!) 180/77, pulse 90, temperature 99 F (37.2 C), temperature source Oral, resp. rate 18, SpO2 97 %. Physical Exam HENT:     Head: Normocephalic.     Right Ear: Tympanic membrane normal.     Nose: Nose normal.     Mouth/Throat:     Mouth: Mucous membranes are moist.  Eyes:     Pupils: Pupils are equal, round, and reactive to light.  Cardiovascular:     Rate and Rhythm: Normal rate.  Pulmonary:     Effort: Pulmonary effort is normal.  Abdominal:     General: Abdomen is flat.  Musculoskeletal:        General: Normal range of motion.  Skin:    General: Skin is warm.  Neurological:     Mental Status: He is alert.     Comments: Strength 5 out of 5 iliopsoas, quads, hamstrings, gastrocs, into tibialis, and EHL.     Assessment/Plan 70 year old presents for left-sided decompressive laminotomy at L4-5  Elaina Hoops, MD 08/16/2021, 5:41 PM

## 2021-08-16 NOTE — Anesthesia Postprocedure Evaluation (Signed)
Anesthesia Post Note  Patient: Andre Gallegos  Procedure(s) Performed: Laminectomy and Foraminotomy - Lumbar four-Lumbar five - left (Left: Back)     Patient location during evaluation: PACU Anesthesia Type: General Level of consciousness: sedated Pain management: pain level controlled Vital Signs Assessment: post-procedure vital signs reviewed and stable Respiratory status: spontaneous breathing and respiratory function stable Cardiovascular status: stable Postop Assessment: no apparent nausea or vomiting Anesthetic complications: no   No notable events documented.  Last Vitals:  Vitals:   08/16/21 2100 08/16/21 2115  BP: (!) 165/74 (!) 153/82  Pulse: 68 70  Resp: 11 11  Temp:  36.4 C  SpO2: 95% 98%    Last Pain:  Vitals:   08/16/21 2115  TempSrc:   PainSc: 4                  Shyasia Funches Lenny

## 2021-08-16 NOTE — Transfer of Care (Signed)
Immediate Anesthesia Transfer of Care Note  Patient: Andre Gallegos  Procedure(s) Performed: Laminectomy and Foraminotomy - Lumbar four-Lumbar five - left (Left: Back)  Patient Location: PACU  Anesthesia Type:General  Level of Consciousness: awake, alert  and oriented  Airway & Oxygen Therapy: Patient Spontanous Breathing and Patient connected to face mask oxygen  Post-op Assessment: Report given to RN, Post -op Vital signs reviewed and stable and Patient moving all extremities  Post vital signs: Reviewed and stable  Last Vitals:  Vitals Value Taken Time  BP 157/94 08/16/21 1957  Temp    Pulse 70 08/16/21 1959  Resp 20 08/16/21 1959  SpO2 99 % 08/16/21 1959  Vitals shown include unvalidated device data.  Last Pain:  Vitals:   08/16/21 1540  TempSrc: Oral  PainSc: 0-No pain      Patients Stated Pain Goal: 3 (95/18/84 1660)  Complications: No notable events documented.

## 2021-08-16 NOTE — Op Note (Signed)
Preoperative diagnosis: Left L5 radiculopathy from severe lumbar spinal stenosis and a grade 1 spondylolisthesis at L4-5  Postoperative diagnosis: Same  Procedure: Left-sided decompressive laminectomy at L4-5 with microscopic foraminotomies microscopic decompression  Surgeon: Dominica Severin Joannie Medine  EBL minimal  Anesthesia General  HPI 70 year old gentleman with progressive worsening left hip and leg pain rating down L5 nerve root pattern work-up revealed severe spinal stenosis at L4-5 with a grade 1 spondylolisthesis.  Patient had isolated radiculopathy and did not have any motion on dynamic films so I opted and recommended a decompressive laminotomy alone extensive went over the risks and benefits of that operation with him as well as perioperative course expectations of outcome and alternatives to surgery and he understood and agreed to proceed forward.  Operative procedure: Patient was brought into the OR was [facility] general anesthesia positioned prone on the Wilson frame his back was prepped and draped in routine sterile fashion preoperative x-ray localized the appropriate level so after infiltration of 10 cc lidocaine with epi midline incision was made and Bovie cautery was used take down subcutaneous tissue and subperiosteal dissection was carried out on the lamina of L4 and L5 confirmed by intraoperative x-ray.  So then utilizing high-speed drill inferior aspect limb of the former facet complex superior aspect of lamina L5 was drilled down laminotomy was begun with a 3 Miller Kerrison punch under microscope illumination identified the dura the ligament flow was noted markedly hypertrophied this was removed in piecemeal fashion there was a large medially projecting spur coming off the facet joint causing severe compression of the left L5 nerve root this was dissected off of the L5 nerve root and removed in piecemeal fashion.  At the end of dyspnea of the decompression there was no further stenosis either on  the thecal sac or L5 foramen.  I also then palpated and explored the L4 foramen which was widely patent.  No evidence of overt instability was appreciated the wound was then copiously irrigated meticulous hemostasis was maintained Gelfoam was ON top of the dura and the muscle fascia approximate layers with active Vicryl skin was closed running 4 subcuticular Dermabond benzoin Steri-Strips and a sterile dressing was applied patient recovery in stable condition.  At the end the case all needle counts and sponge counts were correct.

## 2021-08-17 ENCOUNTER — Encounter (HOSPITAL_COMMUNITY): Payer: Self-pay | Admitting: Neurosurgery

## 2021-08-17 DIAGNOSIS — M48061 Spinal stenosis, lumbar region without neurogenic claudication: Secondary | ICD-10-CM | POA: Diagnosis not present

## 2021-08-17 MED ORDER — HYDROCODONE-ACETAMINOPHEN 5-325 MG PO TABS: 1.0000 | ORAL_TABLET | ORAL | 0 refills | Status: AC | PRN

## 2021-08-17 NOTE — Discharge Summary (Signed)
  Physician Discharge Summary  Patient ID: Andre Gallegos MRN: [id number] DOB/AGE: [id number] 70 y.o. Estimated body mass index is 36.96 kg/m as calculated from the following:   Height as of 08/13/21: 5\' 11"  (1.803 m).   Weight as of 08/13/21: 120.2 kg.   Admit date: 08/16/2021 Discharge date: 08/17/2021  Admission Diagnoses: Grade 1 spondylolisthesis L4-5 lumbar spinal stenosis L4-5 left-sided L5 radiculopathy  Discharge Diagnoses: Same Principal Problem:   Spinal stenosis at L4-L5 level   Discharged Condition: good  Hospital Course: Patient is admitted to hospital underwent left-sided decompressive laminotomy at L4-5.  Postoperative patient did very well covering the floor in the floor was ambulating voiding spontaneously tolerating regular diet and stable for discharge home.  Patient be discharged scheduled follow-up in 1 to 2 weeks.  Consults: Significant Diagnostic Studies: Treatments: Decompressive laminotomy L4-5 left Discharge Exam: Blood pressure (!) 163/82, pulse 96, temperature 98 F (36.7 C), resp. rate 20, SpO2 100 %. Strength 5 out of 5 wound clean dry and intact  Disposition: Home   Allergies as of 08/17/2021   No Known Allergies      Medication List     TAKE these medications    amLODipine 10 MG tablet Commonly known as: NORVASC 1 TABLET BY MOUTH EVERY DAY   aspirin 81 MG EC tablet Take 81 mg by mouth daily. Swallow whole.   atorvastatin 80 MG tablet Commonly known as: LIPITOR Take 1 tablet (80 mg total) by mouth daily at 6 PM.   carvedilol 25 MG tablet Commonly known as: COREG Take 25 mg by mouth 2 (two) times daily. What changed: Another medication with the same name was removed. Continue taking this medication, and follow the directions you see here.   clopidogrel 75 MG tablet Commonly known as: PLAVIX Take 1 tablet (75 mg total) by mouth daily.   ezetimibe 10 MG tablet Commonly known as: ZETIA TAKE 1 TABLET BY MOUTH DAILY    FIBER PO Take 3 capsules by mouth in the morning and at bedtime.   HYDROcodone-acetaminophen 5-325 MG tablet Commonly known as: NORCO/VICODIN Take 1 tablet by mouth every 4 (four) hours as needed for moderate pain ((score 4 to 6)).   meloxicam 7.5 MG tablet Commonly known as: MOBIC Take 7.5 mg by mouth daily.   PROBIOTIC ACIDOPHILUS PO Take 1 capsule by mouth at bedtime.         Signed: Elaina Hoops 08/17/2021, 8:21 AM

## 2021-08-17 NOTE — Discharge Instructions (Signed)

## 2021-08-17 NOTE — Progress Notes (Signed)
Patient awaiting transport to his vehicle via wheelchair by volunteer for discharge home; in no acute distress nor complaints of pain nor discomfort; incision on his back with honeycomb dressing and is clean, dry and intact; room was checked and accounted for all his belongings; discharge instructions concerning his medications, incision care, follow up appointment and when to call the doctor as needed were all discussed with patient by RN and he expressed understanding on the instructions given.

## 2022-02-02 ENCOUNTER — Other Ambulatory Visit: Payer: Self-pay | Admitting: Neurosurgery

## 2022-02-02 DIAGNOSIS — M5440 Lumbago with sciatica, unspecified side: Secondary | ICD-10-CM

## 2022-02-16 ENCOUNTER — Ambulatory Visit
Admission: RE | Admit: 2022-02-16 | Discharge: 2022-02-16 | Disposition: A | Discharge status: Home / Self Care | Payer: Medicare PPO | Source: Ambulatory Visit | Attending: Neurosurgery | Admitting: Neurosurgery

## 2022-02-16 DIAGNOSIS — M5440 Lumbago with sciatica, unspecified side: Secondary | ICD-10-CM | POA: Diagnosis present

## 2022-02-16 MED ORDER — GADOBUTROL 1 MMOL/ML IV SOLN
10.0000 mL | Freq: Once | INTRAVENOUS | Status: AC | PRN
  Administered 2022-02-16: 10 mL via INTRAVENOUS
